# Patient Record
Sex: Female | Born: 1945 | Race: White | Hispanic: No | Smoking: Never smoker
Health system: Southern US, Community
[De-identification: ages and names within clinical notes are randomized; demographics above are authoritative.]

## PROBLEM LIST (undated history)

## (undated) DIAGNOSIS — K589 Irritable bowel syndrome without diarrhea: Secondary | ICD-10-CM

## (undated) DIAGNOSIS — E785 Hyperlipidemia, unspecified: Secondary | ICD-10-CM

## (undated) DIAGNOSIS — Z972 Presence of dental prosthetic device (complete) (partial): Secondary | ICD-10-CM

## (undated) DIAGNOSIS — Z973 Presence of spectacles and contact lenses: Secondary | ICD-10-CM

## (undated) DIAGNOSIS — Z8719 Personal history of other diseases of the digestive system: Secondary | ICD-10-CM

## (undated) DIAGNOSIS — Z9889 Other specified postprocedural states: Secondary | ICD-10-CM

## (undated) DIAGNOSIS — R112 Nausea with vomiting, unspecified: Secondary | ICD-10-CM

## (undated) DIAGNOSIS — M1990 Unspecified osteoarthritis, unspecified site: Secondary | ICD-10-CM

## (undated) DIAGNOSIS — K08109 Complete loss of teeth, unspecified cause, unspecified class: Secondary | ICD-10-CM

## (undated) DIAGNOSIS — T7840XA Allergy, unspecified, initial encounter: Secondary | ICD-10-CM

## (undated) DIAGNOSIS — I10 Essential (primary) hypertension: Secondary | ICD-10-CM

## (undated) DIAGNOSIS — R519 Headache, unspecified: Secondary | ICD-10-CM

## (undated) DIAGNOSIS — K900 Celiac disease: Secondary | ICD-10-CM

## (undated) DIAGNOSIS — J189 Pneumonia, unspecified organism: Secondary | ICD-10-CM

## (undated) DIAGNOSIS — F419 Anxiety disorder, unspecified: Secondary | ICD-10-CM

## (undated) DIAGNOSIS — J45909 Unspecified asthma, uncomplicated: Secondary | ICD-10-CM

## (undated) DIAGNOSIS — R51 Headache: Secondary | ICD-10-CM

## (undated) HISTORY — DX: Essential (primary) hypertension: I10

## (undated) HISTORY — PX: ELBOW SURGERY: SHX618

## (undated) HISTORY — DX: Hyperlipidemia, unspecified: E78.5

## (undated) HISTORY — DX: Allergy, unspecified, initial encounter: T78.40XA

## (undated) HISTORY — PX: CARPAL TUNNEL RELEASE: SHX101

## (undated) HISTORY — DX: Irritable bowel syndrome, unspecified: K58.9

## (undated) HISTORY — PX: TONSILLECTOMY AND ADENOIDECTOMY: SUR1326

## (undated) HISTORY — PX: ABDOMINAL HYSTERECTOMY: SHX81

## (undated) HISTORY — PX: CHOLECYSTECTOMY: SHX55

## (undated) HISTORY — PX: APPENDECTOMY: SHX54

## (undated) HISTORY — PX: NASAL SEPTUM SURGERY: SHX37

## (undated) HISTORY — PX: VARICOSE VEIN SURGERY: SHX832

## (undated) HISTORY — DX: Unspecified asthma, uncomplicated: J45.909

## (undated) HISTORY — PX: BREAST BIOPSY: SHX20

## (undated) HISTORY — PX: SHOULDER SURGERY: SHX246

## (undated) HISTORY — PX: KNEE SURGERY: SHX244

## (undated) HISTORY — PX: EYE SURGERY: SHX253

---

## 1983-04-13 HISTORY — PX: BREAST EXCISIONAL BIOPSY: SUR124

## 1983-04-13 HISTORY — PX: BREAST LUMPECTOMY: SHX2

## 2009-08-31 ENCOUNTER — Encounter: Admission: RE | Admit: 2009-08-31 | Discharge: 2009-08-31 | Payer: Self-pay | Admitting: Orthopedic Surgery

## 2010-01-04 ENCOUNTER — Encounter: Admission: RE | Admit: 2010-01-04 | Discharge: 2010-01-04 | Payer: Self-pay | Admitting: Orthopedic Surgery

## 2011-12-24 ENCOUNTER — Encounter: Payer: Self-pay | Admitting: Family Medicine

## 2011-12-24 ENCOUNTER — Ambulatory Visit: Payer: Self-pay | Admitting: Family Medicine

## 2011-12-24 ENCOUNTER — Ambulatory Visit (INDEPENDENT_AMBULATORY_CARE_PROVIDER_SITE_OTHER): Payer: Medicare Other | Admitting: Family Medicine

## 2011-12-24 VITALS — BP 174/79 | HR 91 | Ht 66.0 in | Wt 153.0 lb

## 2011-12-24 DIAGNOSIS — M542 Cervicalgia: Secondary | ICD-10-CM

## 2011-12-24 NOTE — Patient Instructions (Addendum)
You most likely have a cervical strain, less likely radiculopathy (irritated nerve in your neck). Both are treated similarly which is why we don't do imaging in the first visit unless there's concern for a fracture. Ibuprofen 3 tabs three times a day with food for pain and inflammation (try half of one tablet to see if it makes your heart race first). Consider Flexeril three times a day as needed for muscle spasms (can make you sleepy - if so do not drive while taking this). Consider Vicodin or percocet for severe pain (no driving on this medicine). Consider cervical collar if severely painful. Simple range of motion exercises within limits of pain to prevent further stiffness. Start physical therapy for stretching, exercises, traction, and modalities. Heat 15 minutes at a time 3-4 times a day to help with spasms. Watch head position when on computers, texting, when sleeping in bed - should in line with back to prevent spasms. Consider home traction unit if you get benefit with this in physical therapy. If not improving we will consider x-rays and MRI. Follow up with me in 1 month for reevaluation.

## 2011-12-26 ENCOUNTER — Encounter: Payer: Self-pay | Admitting: Family Medicine

## 2011-12-26 DIAGNOSIS — M542 Cervicalgia: Secondary | ICD-10-CM | POA: Insufficient documentation

## 2011-12-26 NOTE — Assessment & Plan Note (Signed)
most likely due to cervical strain, less likely radiculopathy.  Start with formal PT, ibuprofen for pain and inflammation.  Heat as needed.  Discussed ergonomic issues.  F/u in 1 month for reevaluation.  If not improving consider further imaging.
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
## 2012-01-05 ENCOUNTER — Ambulatory Visit: Payer: Medicare Other | Attending: Family Medicine | Admitting: Physical Therapy

## 2012-01-05 DIAGNOSIS — M542 Cervicalgia: Secondary | ICD-10-CM | POA: Insufficient documentation

## 2012-01-05 DIAGNOSIS — IMO0001 Reserved for inherently not codable concepts without codable children: Secondary | ICD-10-CM | POA: Insufficient documentation

## 2012-01-05 DIAGNOSIS — M25519 Pain in unspecified shoulder: Secondary | ICD-10-CM | POA: Insufficient documentation

## 2012-01-07 ENCOUNTER — Ambulatory Visit: Payer: Medicare Other | Admitting: Physical Therapy

## 2012-01-10 ENCOUNTER — Ambulatory Visit: Payer: Medicare Other | Admitting: Rehabilitation

## 2012-01-12 ENCOUNTER — Ambulatory Visit: Payer: Medicare Other | Attending: Family Medicine | Admitting: Rehabilitation

## 2012-01-12 DIAGNOSIS — M542 Cervicalgia: Secondary | ICD-10-CM | POA: Insufficient documentation

## 2012-01-12 DIAGNOSIS — M25519 Pain in unspecified shoulder: Secondary | ICD-10-CM | POA: Insufficient documentation

## 2012-01-12 DIAGNOSIS — IMO0001 Reserved for inherently not codable concepts without codable children: Secondary | ICD-10-CM | POA: Insufficient documentation

## 2012-01-13 ENCOUNTER — Encounter: Payer: Self-pay | Admitting: Family Medicine

## 2012-01-13 ENCOUNTER — Encounter: Payer: Medicare Other | Admitting: Rehabilitation

## 2012-01-13 ENCOUNTER — Ambulatory Visit (INDEPENDENT_AMBULATORY_CARE_PROVIDER_SITE_OTHER): Payer: Medicare Other | Admitting: Family Medicine

## 2012-01-13 VITALS — BP 163/92 | HR 79 | Ht 66.0 in | Wt 150.0 lb

## 2012-01-13 DIAGNOSIS — M25532 Pain in left wrist: Secondary | ICD-10-CM

## 2012-01-13 DIAGNOSIS — M25539 Pain in unspecified wrist: Secondary | ICD-10-CM

## 2012-01-13 NOTE — Patient Instructions (Addendum)
You have compressed the ulnar nerve at the wrist in your sleep (compression at Guyon's canal). This usually resolves with time but can take several weeks. We consider prednisone, anti-inflammatories but these are likely to increase your blood pressure. Start iontophoresis in physical therapy. Avoid icing or heat to the area. Wrist brace for immobilization - absolutely wear at bedtime and as much as possible during the day. We will talk about this when you come back for your neck.

## 2012-01-14 ENCOUNTER — Encounter: Payer: Medicare Other | Admitting: Physical Therapy

## 2012-01-16 ENCOUNTER — Encounter: Payer: Self-pay | Admitting: Family Medicine

## 2012-01-16 DIAGNOSIS — M25532 Pain in left wrist: Secondary | ICD-10-CM | POA: Insufficient documentation

## 2012-01-16 NOTE — Assessment & Plan Note (Signed)
Compression at guyon's canal of the ulnar nerve.  This should heal well with time and rest.  Wrist brace to help immobilize and prevent flexion as much as possible.  Can consider prednisone, nsaids but she would like to avoid as they do increase her blood pressure.  Reevaluate at f/u in 4 weeks for her neck.
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
## 2012-01-17 ENCOUNTER — Ambulatory Visit: Payer: Medicare Other | Admitting: Physical Therapy

## 2012-01-19 ENCOUNTER — Ambulatory Visit: Payer: Medicare Other | Admitting: Rehabilitation

## 2012-01-24 ENCOUNTER — Ambulatory Visit (INDEPENDENT_AMBULATORY_CARE_PROVIDER_SITE_OTHER): Payer: Medicare Other | Admitting: Family Medicine

## 2012-01-24 ENCOUNTER — Encounter: Payer: Self-pay | Admitting: Family Medicine

## 2012-01-24 ENCOUNTER — Ambulatory Visit: Payer: Medicare Other | Admitting: Physical Therapy

## 2012-01-24 VITALS — BP 153/96 | HR 82 | Ht 66.0 in | Wt 150.0 lb

## 2012-01-24 DIAGNOSIS — M25519 Pain in unspecified shoulder: Secondary | ICD-10-CM

## 2012-01-24 DIAGNOSIS — M25511 Pain in right shoulder: Secondary | ICD-10-CM

## 2012-01-25 ENCOUNTER — Encounter: Payer: Self-pay | Admitting: Family Medicine

## 2012-01-25 DIAGNOSIS — M25511 Pain in right shoulder: Secondary | ICD-10-CM | POA: Insufficient documentation
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
## 2012-01-25 NOTE — Assessment & Plan Note (Signed)
now does have signs of rotator cuff impingement.  Declined nitro and cortisone shot (has not tolerated steroids in past).  Would like to go ahead with formal PT for this so have added this.  F/u in 1 month.

## 2012-01-26 ENCOUNTER — Ambulatory Visit: Payer: Medicare Other | Admitting: Rehabilitation

## 2012-01-28 ENCOUNTER — Ambulatory Visit: Payer: Medicare Other | Admitting: Cardiology

## 2012-01-31 ENCOUNTER — Ambulatory Visit: Payer: Medicare Other | Admitting: Physical Therapy

## 2012-02-02 ENCOUNTER — Ambulatory Visit: Payer: Medicare Other | Admitting: Physical Therapy

## 2012-02-07 ENCOUNTER — Encounter: Payer: Medicare Other | Admitting: Rehabilitation

## 2012-02-07 ENCOUNTER — Ambulatory Visit: Payer: Medicare Other | Admitting: Rehabilitation

## 2012-02-09 ENCOUNTER — Ambulatory Visit: Payer: Medicare Other | Admitting: Physical Therapy

## 2012-02-11 ENCOUNTER — Ambulatory Visit: Payer: Medicare Other | Admitting: Cardiology

## 2012-02-14 ENCOUNTER — Ambulatory Visit: Payer: Medicare Other | Attending: Family Medicine | Admitting: Rehabilitation

## 2012-02-14 DIAGNOSIS — M25519 Pain in unspecified shoulder: Secondary | ICD-10-CM | POA: Insufficient documentation

## 2012-02-14 DIAGNOSIS — M542 Cervicalgia: Secondary | ICD-10-CM | POA: Insufficient documentation

## 2012-02-14 DIAGNOSIS — IMO0001 Reserved for inherently not codable concepts without codable children: Secondary | ICD-10-CM | POA: Insufficient documentation

## 2012-02-16 ENCOUNTER — Ambulatory Visit: Payer: Medicare Other | Admitting: Physical Therapy

## 2012-02-21 ENCOUNTER — Encounter: Payer: Self-pay | Admitting: Cardiology

## 2012-02-21 ENCOUNTER — Ambulatory Visit: Payer: Medicare Other | Admitting: Rehabilitation

## 2012-02-21 ENCOUNTER — Encounter: Payer: Self-pay | Admitting: *Deleted

## 2012-02-23 ENCOUNTER — Ambulatory Visit: Payer: Medicare Other | Admitting: Physical Therapy

## 2012-02-23 ENCOUNTER — Ambulatory Visit (INDEPENDENT_AMBULATORY_CARE_PROVIDER_SITE_OTHER): Payer: Medicare Other | Admitting: Cardiology

## 2012-02-23 ENCOUNTER — Encounter: Payer: Self-pay | Admitting: Cardiology

## 2012-02-23 VITALS — BP 164/98 | HR 78 | Ht 65.0 in | Wt 158.0 lb

## 2012-02-23 DIAGNOSIS — E785 Hyperlipidemia, unspecified: Secondary | ICD-10-CM

## 2012-02-23 DIAGNOSIS — I10 Essential (primary) hypertension: Secondary | ICD-10-CM

## 2012-02-23 NOTE — Patient Instructions (Addendum)
Your physician wants you to follow-up in: ONE YEAR WITH DR CRENSHAW IN [place] You will receive a reminder letter in the mail two months in advance. If you don't receive a letter, please call our office to schedule the follow-up appointment.  

## 2012-02-23 NOTE — Assessment & Plan Note (Signed)
Patient's recent LDL was greater than 100 but her HDL was greater than 100 as well. Continue management per primary care. I do not think a statin is indicated.

## 2012-02-23 NOTE — Progress Notes (Signed)
HPI: 66 year old female for evaluation of hypertension. Patient has had previous echocardiogram and stress test in [facility] in the past 5 years but I do not have those records available. By her report these studies were normal. She typically does not have dyspnea on exertion, orthopnea, PND, pedal edema, palpitations, syncope or chest pain. No claudication. Her blood pressure is typically controlled by her report.  Current Outpatient Prescriptions  Medication Sig Dispense Refill  . Biotin 5 MG CAPS Take 1 capsule by mouth daily.      . Cholecalciferol (VITAMIN D3) 2000 UNITS capsule Take 2,000 Units by mouth daily.      . NON FORMULARY Raseberry Keton and green tea 1 tab oral daily      . OLIVE LEAF EXTRACT PO Take 1 tablet by mouth daily.      Marland Kitchen olmesartan (BENICAR) 20 MG tablet Take 20 mg by mouth daily.      Marland Kitchen PREMARIN 1.25 MG tablet Take 1.25 mg by mouth daily.       Marland Kitchen Resveratrol 250 MG CAPS Take 1 capsule by mouth daily.      . Vitamin Mixture (ESTER-C PO) Take 1 tablet by mouth daily.        Allergies  Allergen Reactions  . Aspirin   . Codeine   . Penicillins   . Shellfish Allergy   . Sulfa Antibiotics     Past Medical History  Diagnosis Date  . Allergy   . Asthma   . Hypertension   . Hyperlipidemia   . IBS (irritable bowel syndrome)     Past Surgical History  Procedure Date  . Shoulder surgery   . Elbow surgery   . Knee surgery   . Carpal tunnel release   . Varicose vein surgery   . Nasal septum surgery   . Abdominal hysterectomy   . Cholecystectomy   . Appendectomy   . Tonsillectomy and adenoidectomy   . Breast lumpectomy     History   Social History  . Marital Status: Married    Spouse Name: N/A    Number of Children: 3  . Years of Education: N/A   Occupational History  .      Safety consultant   Social History Main Topics  . Smoking status: Never Smoker   . Smokeless tobacco: Not on file  . Alcohol Use: Yes     Comment: Occasional glass  of wine  . Drug Use: Not on file  . Sexually Active: Not on file   Other Topics Concern  . Not on file   Social History Narrative  . No narrative on file    Family History  Problem Relation Age of Onset  . Hypertension Mother   . Heart attack Father     MI at age 58  . Diabetes Neg Hx   . Sudden death Neg Hx   . Hyperlipidemia Other   . Hypertension Other     ROS: no fevers or chills, productive cough, hemoptysis, dysphasia, odynophagia, melena, hematochezia, dysuria, hematuria, rash, seizure activity, orthopnea, PND, pedal edema, claudication. Remaining systems are negative.  Physical Exam:   Blood pressure 164/98, pulse 78, height 5\' 5"  (1.651 m), weight 158 lb (71.668 kg).  General:  Well developed/well nourished in NAD Skin warm/dry Patient not depressed No peripheral clubbing Back-normal HEENT-normal/normal eyelids Neck supple/normal carotid upstroke bilaterally; no bruits; no JVD; no thyromegaly chest - CTA/ normal expansion CV - RRR/normal S1 and S2; no murmurs, rubs or gallops;  PMI nondisplaced  Abdomen -NT/ND, no HSM, no mass, + bowel sounds, no bruit 2+ femoral pulses, no bruits Ext-no edema, chords, 2+ DP Neuro-grossly nonfocal  ECG sinus rhythm, left anterior fascicular block, RV conduction delay, cannot rule out prior septal infarct.

## 2012-02-23 NOTE — Assessment & Plan Note (Signed)
Patient's blood pressure is mildly elevated today but she follows this at home and it is typically controlled. She is also under significant amounts of stress as her husband has been diagnosed with prostate cancer and her daughter with breast cancer. She will continue her present medications. If her blood pressure remains high at home on followup readings I will increase her Benicar. We discussed lifestyle modification including weight loss, exercise and low-sodium diet. She does not smoke.

## 2012-02-28 ENCOUNTER — Ambulatory Visit: Payer: Medicare Other | Admitting: Rehabilitation

## 2012-03-01 ENCOUNTER — Ambulatory Visit: Payer: Medicare Other | Admitting: Physical Therapy

## 2012-03-03 ENCOUNTER — Ambulatory Visit: Payer: Medicare Other | Admitting: Family Medicine

## 2012-03-06 ENCOUNTER — Ambulatory Visit: Payer: Medicare Other | Admitting: Rehabilitation

## 2012-03-06 ENCOUNTER — Ambulatory Visit: Payer: Medicare Other | Admitting: Family Medicine

## 2012-03-08 ENCOUNTER — Ambulatory Visit: Payer: Medicare Other | Admitting: Rehabilitation

## 2012-03-13 ENCOUNTER — Ambulatory Visit: Payer: Medicare Other | Admitting: Family Medicine

## 2012-09-21 ENCOUNTER — Encounter: Payer: Self-pay | Admitting: Specialist

## 2012-09-21 NOTE — Progress Notes (Signed)
Individual counseling session with pt at 9am on 6/12  [facility] Counseling Intern Haroldine Laws

## 2012-10-17 ENCOUNTER — Encounter: Payer: Self-pay | Admitting: Specialist
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
## 2013-05-06 ENCOUNTER — Other Ambulatory Visit: Payer: Self-pay | Admitting: Family Medicine

## 2013-05-07 NOTE — Telephone Encounter (Signed)
Patient needs an appt.  She has not been seen since 02/2012. PG

## 2013-05-09 ENCOUNTER — Ambulatory Visit (INDEPENDENT_AMBULATORY_CARE_PROVIDER_SITE_OTHER): Payer: Medicare Other | Admitting: Cardiology

## 2013-05-09 ENCOUNTER — Encounter: Payer: Self-pay | Admitting: Cardiology

## 2013-05-09 VITALS — BP 164/80 | HR 75 | Wt 149.0 lb

## 2013-05-09 DIAGNOSIS — I10 Essential (primary) hypertension: Secondary | ICD-10-CM

## 2013-05-09 DIAGNOSIS — R079 Chest pain, unspecified: Secondary | ICD-10-CM

## 2013-05-09 MED ORDER — OLMESARTAN MEDOXOMIL 40 MG PO TABS: 40.0000 mg | ORAL_TABLET | Freq: Every day | ORAL | Status: DC

## 2013-05-09 NOTE — Assessment & Plan Note (Signed)
Blood pressure mildly elevated. Increase Benicar to 40 mg daily. Check potassium and renal function in one week.

## 2013-05-09 NOTE — Patient Instructions (Signed)
Your physician wants you to follow-up in: ONE YEAR WITH DR Shelda PalRENSHAW You will receive a reminder letter in the mail two months in advance. If you don't receive a letter, please call our office to schedule the follow-up appointment.   INCREASE BENICAR TO 40 MG ONCE DAILY  Your physician recommends that you return for lab work in: ONE WEEK- DO NOT EAT PRIOR TO LAB WORK  Your physician has requested that you have an echocardiogram. Echocardiography is a painless test that uses sound waves to create images of your heart. It provides your doctor with information about the size and shape of your heart and how well your heart's chambers and valves are working. This procedure takes approximately one hour. There are no restrictions for this procedure.

## 2013-05-09 NOTE — Progress Notes (Signed)
      HPI: FU hypertension. Myoview in 2009 showed an ejection fraction of 64% and normal perfusion. Echocardiogram in February of 2011 showed normal LV function and mild mitral regurgitation. She was last seen in November of 2013. Since then, she occasionally has a pinching chest pain that lasts for approximately 1 minute. No dyspnea on exertion, exertional chest pain or syncope. Significant stress due to recent loss of her husband.    Current Outpatient Prescriptions  Medication Sig Dispense Refill  . aspirin 81 MG tablet Take 81 mg by mouth daily.      . Biotin 5 MG CAPS Take 1 capsule by mouth daily.      . Cholecalciferol (VITAMIN D3) 2000 UNITS capsule Take 2,000 Units by mouth daily.      . NON FORMULARY Raseberry Keton and green tea 1 tab oral daily      . OLIVE LEAF EXTRACT PO Take 1 tablet by mouth daily.      Marland Kitchen. olmesartan (BENICAR) 20 MG tablet Take 20 mg by mouth daily.      Marland Kitchen. PREMARIN 1.25 MG tablet Take 1.25 mg by mouth daily.       Marland Kitchen. Resveratrol 250 MG CAPS Take 1 capsule by mouth daily.      . Vitamin Mixture (ESTER-C PO) Take 1 tablet by mouth daily.      . Vitamin Mixture (VITAMIN E COMPLETE) CAPS Take 1 capsule by mouth daily.       No current facility-administered medications for this visit.     Past Medical History  Diagnosis Date  . Allergy   . Asthma   . Hypertension   . Hyperlipidemia   . IBS (irritable bowel syndrome)     Past Surgical History  Procedure Laterality Date  . Shoulder surgery    . Elbow surgery    . Knee surgery    . Carpal tunnel release    . Varicose vein surgery    . Nasal septum surgery    . Abdominal hysterectomy    . Cholecystectomy    . Appendectomy    . Tonsillectomy and adenoidectomy    . Breast lumpectomy      History   Social History  . Marital Status: Married    Spouse Name: N/A    Number of Children: 3  . Years of Education: N/A   Occupational History  .      Safety consultant   Social History Main Topics   . Smoking status: Never Smoker   . Smokeless tobacco: Not on file  . Alcohol Use: Yes     Comment: Occasional glass of wine  . Drug Use: Not on file  . Sexual Activity: Not on file   Other Topics Concern  . Not on file   Social History Narrative  . No narrative on file    ROS: no fevers or chills, productive cough, hemoptysis, dysphasia, odynophagia, melena, hematochezia, dysuria, hematuria, rash, seizure activity, orthopnea, PND, pedal edema, claudication. Remaining systems are negative.  Physical Exam: Well-developed well-nourished in no acute distress.  Skin is warm and dry.  HEENT is normal.  Neck is supple.  Chest is clear to auscultation with normal expansion.  Cardiovascular exam is regular rate and rhythm.  Abdominal exam nontender or distended. No masses palpated. Extremities show no edema. neuro grossly intact  ECG sinus rhythm at a rate of 75. Left anterior fascicular block. Poor R wave progression cannot rule out prior anterior infarct.

## 2013-05-09 NOTE — Assessment & Plan Note (Signed)
Symptoms atypical; previous functional study negative. We'll repeat echocardiogram. If LV function normal no further ischemia evaluation.

## 2013-05-09 NOTE — Assessment & Plan Note (Signed)
Check lipids 

## 2013-05-30 ENCOUNTER — Other Ambulatory Visit (HOSPITAL_COMMUNITY): Payer: Medicare Other

## 2013-06-06 ENCOUNTER — Encounter: Payer: Self-pay | Admitting: *Deleted

## 2013-06-07 ENCOUNTER — Other Ambulatory Visit (HOSPITAL_COMMUNITY): Payer: Medicare Other

## 2013-06-19 ENCOUNTER — Ambulatory Visit (HOSPITAL_COMMUNITY): Payer: Medicare Other | Attending: Cardiology | Admitting: Cardiology

## 2013-06-19 ENCOUNTER — Other Ambulatory Visit: Payer: Self-pay | Admitting: Orthopedic Surgery

## 2013-06-19 DIAGNOSIS — M25531 Pain in right wrist: Secondary | ICD-10-CM

## 2013-06-19 DIAGNOSIS — M25512 Pain in left shoulder: Secondary | ICD-10-CM

## 2013-06-19 DIAGNOSIS — R079 Chest pain, unspecified: Secondary | ICD-10-CM | POA: Insufficient documentation

## 2013-06-19 NOTE — Progress Notes (Signed)
Echo performed. 

## 2013-06-20 ENCOUNTER — Other Ambulatory Visit: Payer: Self-pay | Admitting: Cardiology

## 2013-06-20 LAB — BASIC METABOLIC PANEL WITH GFR
BUN: 13 mg/dL (ref 6–23)
CO2: 28 meq/L (ref 19–32)
Calcium: 9.1 mg/dL (ref 8.4–10.5)
Chloride: 102 mEq/L (ref 96–112)
Creat: 0.83 mg/dL (ref 0.50–1.10)
GFR, EST AFRICAN AMERICAN: 83 mL/min
GFR, Est Non African American: 72 mL/min
GLUCOSE: 84 mg/dL (ref 70–99)
Potassium: 4.5 mEq/L (ref 3.5–5.3)
Sodium: 137 mEq/L (ref 135–145)

## 2013-06-20 LAB — HEPATIC FUNCTION PANEL
ALK PHOS: 52 U/L (ref 39–117)
ALT: 13 U/L (ref 0–35)
AST: 15 U/L (ref 0–37)
Albumin: 3.8 g/dL (ref 3.5–5.2)
BILIRUBIN TOTAL: 0.4 mg/dL (ref 0.2–1.2)
Bilirubin, Direct: 0.1 mg/dL (ref 0.0–0.3)
Indirect Bilirubin: 0.3 mg/dL (ref 0.2–1.2)
TOTAL PROTEIN: 6.6 g/dL (ref 6.0–8.3)

## 2013-06-20 LAB — LIPID PANEL
CHOLESTEROL: 225 mg/dL — AB (ref 0–200)
HDL: 74 mg/dL (ref >39)
LDL CALC: 120 mg/dL — AB (ref 0–99)
TRIGLYCERIDES: 154 mg/dL — AB (ref <150)
Total CHOL/HDL Ratio: 3 Ratio
VLDL: 31 mg/dL (ref 0–40)

## 2013-06-21 ENCOUNTER — Telehealth: Payer: Self-pay | Admitting: *Deleted

## 2013-06-21 NOTE — Telephone Encounter (Signed)
Message copied by Barrie FolkGRAY, Martrell Eguia F on Thu Jun 21, 2013  3:41 PM ------      Message from: Lewayne BuntingRENSHAW, BRIAN S      Created: Thu Jun 21, 2013  8:48 AM       Continue diet      Olga MillersBrian Crenshaw       ------

## 2013-06-21 NOTE — Telephone Encounter (Signed)
Pt is aware of lab results  She states she did not tolerate the increased dose of Benicar that was started at last ov in January 2015. She went back to the 20mg  dose. States she is working on "letting things go & reducing my stress"   Mylo Redebbie Margarete Horace RN

## 2013-06-24 ENCOUNTER — Ambulatory Visit
Admission: RE | Admit: 2013-06-24 | Discharge: 2013-06-24 | Disposition: A | Discharge status: Home / Self Care | Payer: Medicare Other | Source: Ambulatory Visit | Attending: Orthopedic Surgery | Admitting: Orthopedic Surgery

## 2013-06-24 DIAGNOSIS — M25531 Pain in right wrist: Secondary | ICD-10-CM

## 2013-06-24 DIAGNOSIS — M25512 Pain in left shoulder: Secondary | ICD-10-CM

## 2013-09-11 ENCOUNTER — Other Ambulatory Visit: Payer: Self-pay | Admitting: Orthopedic Surgery

## 2013-09-14 ENCOUNTER — Encounter (HOSPITAL_BASED_OUTPATIENT_CLINIC_OR_DEPARTMENT_OTHER): Payer: Self-pay | Admitting: *Deleted

## 2013-09-14 NOTE — Progress Notes (Signed)
Pt will come in for bmet-sees dr Jens Som for htn-m-echo done 3/15 good

## 2013-09-17 ENCOUNTER — Encounter (HOSPITAL_BASED_OUTPATIENT_CLINIC_OR_DEPARTMENT_OTHER)
Admission: RE | Admit: 2013-09-17 | Discharge: 2013-09-17 | Disposition: A | Discharge status: Home / Self Care | Payer: Medicare Other | Source: Ambulatory Visit | Attending: Orthopedic Surgery | Admitting: Orthopedic Surgery

## 2013-09-17 LAB — BASIC METABOLIC PANEL
BUN: 13 mg/dL (ref 6–23)
CO2: 25 mEq/L (ref 19–32)
CREATININE: 0.83 mg/dL (ref 0.50–1.10)
Calcium: 9.2 mg/dL (ref 8.4–10.5)
Chloride: 102 mEq/L (ref 96–112)
GFR calc Af Amer: 81 mL/min — ABNORMAL LOW (ref >90)
GFR, EST NON AFRICAN AMERICAN: 70 mL/min — AB (ref >90)
Glucose, Bld: 81 mg/dL (ref 70–99)
POTASSIUM: 5.3 meq/L (ref 3.7–5.3)
Sodium: 138 mEq/L (ref 137–147)
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
## 2013-09-20 ENCOUNTER — Encounter (HOSPITAL_BASED_OUTPATIENT_CLINIC_OR_DEPARTMENT_OTHER): Payer: Medicare Other | Admitting: Anesthesiology

## 2013-09-20 ENCOUNTER — Encounter (HOSPITAL_BASED_OUTPATIENT_CLINIC_OR_DEPARTMENT_OTHER): Payer: Self-pay | Admitting: Anesthesiology

## 2013-09-20 ENCOUNTER — Ambulatory Visit (HOSPITAL_BASED_OUTPATIENT_CLINIC_OR_DEPARTMENT_OTHER): Payer: Medicare Other | Admitting: Anesthesiology

## 2013-09-20 ENCOUNTER — Ambulatory Visit (HOSPITAL_BASED_OUTPATIENT_CLINIC_OR_DEPARTMENT_OTHER)
Admission: RE | Admit: 2013-09-20 | Discharge: 2013-09-20 | Disposition: A | Discharge status: Home / Self Care | Payer: Medicare Other | Source: Ambulatory Visit | Attending: Orthopedic Surgery | Admitting: Orthopedic Surgery

## 2013-09-20 ENCOUNTER — Encounter (HOSPITAL_BASED_OUTPATIENT_CLINIC_OR_DEPARTMENT_OTHER)
Admission: RE | Disposition: A | Discharge status: Home / Self Care | Payer: Self-pay | Source: Ambulatory Visit | Attending: Orthopedic Surgery

## 2013-09-20 DIAGNOSIS — M25539 Pain in unspecified wrist: Secondary | ICD-10-CM | POA: Insufficient documentation

## 2013-09-20 DIAGNOSIS — I10 Essential (primary) hypertension: Secondary | ICD-10-CM | POA: Insufficient documentation

## 2013-09-20 DIAGNOSIS — J45909 Unspecified asthma, uncomplicated: Secondary | ICD-10-CM | POA: Insufficient documentation

## 2013-09-20 HISTORY — DX: Presence of dental prosthetic device (complete) (partial): Z97.2

## 2013-09-20 HISTORY — DX: Unspecified osteoarthritis, unspecified site: M19.90

## 2013-09-20 HISTORY — PX: WRIST ARTHROSCOPY: SHX838

## 2013-09-20 HISTORY — DX: Presence of spectacles and contact lenses: Z97.3

## 2013-09-20 HISTORY — DX: Complete loss of teeth, unspecified cause, unspecified class: K08.109

## 2013-09-20 LAB — POCT HEMOGLOBIN-HEMACUE: Hemoglobin: 14.8 g/dL (ref 12.0–15.0)

## 2013-09-20 SURGERY — ARTHROSCOPY, WRIST
Anesthesia: General | Site: Wrist | Laterality: Right

## 2013-09-20 MED ORDER — OXYCODONE HCL 5 MG PO TABS: 5.0000 mg | ORAL_TABLET | Freq: Once | ORAL | Status: DC | PRN

## 2013-09-20 MED ORDER — ONDANSETRON HCL 4 MG/2ML IJ SOLN
INTRAMUSCULAR | Status: DC | PRN
  Administered 2013-09-20: 4 mg via INTRAVENOUS

## 2013-09-20 MED ORDER — FENTANYL CITRATE 0.05 MG/ML IJ SOLN
INTRAMUSCULAR | Status: DC | PRN
  Administered 2013-09-20 (×4): 50 ug via INTRAVENOUS

## 2013-09-20 MED ORDER — LIDOCAINE HCL 2 % IJ SOLN
INTRAMUSCULAR | Status: DC | PRN
Start: 2013-09-20 — End: 2013-09-20
  Administered 2013-09-20: 4 mL

## 2013-09-20 MED ORDER — MIDAZOLAM HCL 5 MG/5ML IJ SOLN
INTRAMUSCULAR | Status: DC | PRN
  Administered 2013-09-20: 2 mg via INTRAVENOUS

## 2013-09-20 MED ORDER — OXYCODONE HCL 5 MG/5ML PO SOLN: 5.0000 mg | Freq: Once | ORAL | Status: DC | PRN

## 2013-09-20 MED ORDER — VANCOMYCIN HCL IN DEXTROSE 1-5 GM/200ML-% IV SOLN
INTRAVENOUS | Status: AC
  Filled 2013-09-20: qty 200

## 2013-09-20 MED ORDER — LACTATED RINGERS IV SOLN
INTRAVENOUS | Status: DC
  Administered 2013-09-20 (×3): via INTRAVENOUS

## 2013-09-20 MED ORDER — MIDAZOLAM HCL 2 MG/2ML IJ SOLN: 1.0000 mg | INTRAMUSCULAR | Status: DC | PRN

## 2013-09-20 MED ORDER — VANCOMYCIN HCL 1000 MG IV SOLR
1000.0000 mg | INTRAVENOUS | Status: DC | PRN
  Administered 2013-09-20: 1000 mg via INTRAVENOUS

## 2013-09-20 MED ORDER — METOCLOPRAMIDE HCL 5 MG/ML IJ SOLN
10.0000 mg | Freq: Once | INTRAMUSCULAR | Status: AC | PRN
  Administered 2013-09-20: 10 mg via INTRAVENOUS

## 2013-09-20 MED ORDER — HYDROMORPHONE HCL PF 1 MG/ML IJ SOLN
INTRAMUSCULAR | Status: AC
  Filled 2013-09-20: qty 1

## 2013-09-20 MED ORDER — SODIUM CHLORIDE 0.9 % IJ SOLN
INTRAMUSCULAR | Status: AC
  Filled 2013-09-20: qty 10

## 2013-09-20 MED ORDER — FENTANYL CITRATE 0.05 MG/ML IJ SOLN
INTRAMUSCULAR | Status: AC
  Filled 2013-09-20: qty 4

## 2013-09-20 MED ORDER — PROPOFOL 10 MG/ML IV BOLUS
INTRAVENOUS | Status: DC | PRN
  Administered 2013-09-20: 100 mg via INTRAVENOUS
  Administered 2013-09-20: 150 mg via INTRAVENOUS

## 2013-09-20 MED ORDER — PROMETHAZINE HCL 25 MG/ML IJ SOLN
INTRAMUSCULAR | Status: AC
  Filled 2013-09-20: qty 1

## 2013-09-20 MED ORDER — MIDAZOLAM HCL 2 MG/2ML IJ SOLN
INTRAMUSCULAR | Status: AC
  Filled 2013-09-20: qty 2

## 2013-09-20 MED ORDER — FENTANYL CITRATE 0.05 MG/ML IJ SOLN: 50.0000 ug | INTRAMUSCULAR | Status: DC | PRN

## 2013-09-20 MED ORDER — PROMETHAZINE HCL 25 MG/ML IJ SOLN
6.2500 mg | INTRAMUSCULAR | Status: DC | PRN
  Administered 2013-09-20: 6.25 mg via INTRAVENOUS

## 2013-09-20 MED ORDER — HYDROMORPHONE HCL PF 1 MG/ML IJ SOLN
0.2500 mg | INTRAMUSCULAR | Status: DC | PRN
  Administered 2013-09-20 (×4): 0.5 mg via INTRAVENOUS

## 2013-09-20 MED ORDER — LIDOCAINE HCL (CARDIAC) 20 MG/ML IV SOLN
INTRAVENOUS | Status: DC | PRN
  Administered 2013-09-20: 50 mg via INTRAVENOUS

## 2013-09-20 MED ORDER — CHLORHEXIDINE GLUCONATE 4 % EX LIQD: 60.0000 mL | Freq: Once | CUTANEOUS | Status: DC

## 2013-09-20 MED ORDER — DOXYCYCLINE HYCLATE 100 MG PO TABS: 100.0000 mg | ORAL_TABLET | Freq: Two times a day (BID) | ORAL | Status: DC

## 2013-09-20 MED ORDER — HYDROMORPHONE HCL 2 MG PO TABS: 2.0000 mg | ORAL_TABLET | ORAL | Status: DC | PRN

## 2013-09-20 MED ORDER — LIDOCAINE HCL 2 % IJ SOLN
INTRAMUSCULAR | Status: AC
  Filled 2013-09-20: qty 20

## 2013-09-20 MED ORDER — METOCLOPRAMIDE HCL 5 MG/ML IJ SOLN
INTRAMUSCULAR | Status: AC
  Filled 2013-09-20: qty 2

## 2013-09-20 MED ORDER — DEXAMETHASONE SODIUM PHOSPHATE 4 MG/ML IJ SOLN
INTRAMUSCULAR | Status: DC | PRN
  Administered 2013-09-20: 10 mg via INTRAVENOUS

## 2013-09-20 MED ORDER — VANCOMYCIN HCL IN DEXTROSE 1-5 GM/200ML-% IV SOLN: 1000.0000 mg | INTRAVENOUS | Status: DC

## 2013-09-20 SURGICAL SUPPLY — 93 items
BANDAGE ADH SHEER 1  50/CT (GAUZE/BANDAGES/DRESSINGS) IMPLANT
BANDAGE ELASTIC 3 VELCRO ST LF (GAUZE/BANDAGES/DRESSINGS) ×6 IMPLANT
BANDAGE ELASTIC 4 VELCRO ST LF (GAUZE/BANDAGES/DRESSINGS) ×3 IMPLANT
BLADE AVERAGE 25MMX9MM (BLADE)
BLADE AVERAGE 25X9 (BLADE) IMPLANT
BLADE MINI RND TIP GREEN BEAV (BLADE) ×3 IMPLANT
BLADE SURG 15 STRL LF DISP TIS (BLADE) ×1 IMPLANT
BLADE SURG 15 STRL SS (BLADE) ×3
BNDG CMPR 9X4 STRL LF SNTH (GAUZE/BANDAGES/DRESSINGS) ×1
BNDG COHESIVE 3X5 TAN STRL LF (GAUZE/BANDAGES/DRESSINGS) IMPLANT
BNDG COHESIVE 4X5 TAN STRL (GAUZE/BANDAGES/DRESSINGS) IMPLANT
BNDG ESMARK 4X9 LF (GAUZE/BANDAGES/DRESSINGS) ×3 IMPLANT
BNDG GAUZE ELAST 4 BULKY (GAUZE/BANDAGES/DRESSINGS) IMPLANT
BRUSH SCRUB EZ PLAIN DRY (MISCELLANEOUS) ×3 IMPLANT
BUR CUDA 2.9 (BURR) IMPLANT
BUR CUDA 2.9MM (BURR)
BUR FULL RADIUS 2.9 (BURR) IMPLANT
BUR FULL RADIUS 2.9MM (BURR)
BUR GATOR 2.9 (BURR) ×2 IMPLANT
BUR GATOR 2.9MM (BURR) ×1
BUR SPHERICAL 2.9 (BURR) IMPLANT
BUR SPHERICAL 2.9MM (BURR)
CANISTER SUCT 1200ML W/VALVE (MISCELLANEOUS) IMPLANT
CLOSURE WOUND 1/2 X4 (GAUZE/BANDAGES/DRESSINGS)
CORDS BIPOLAR (ELECTRODE) ×3 IMPLANT
COVER MAYO STAND STRL (DRAPES) ×3 IMPLANT
COVER TABLE BACK 60X90 (DRAPES) ×3 IMPLANT
CUFF TOURNIQUET SINGLE 18IN (TOURNIQUET CUFF) IMPLANT
DECANTER SPIKE VIAL GLASS SM (MISCELLANEOUS) IMPLANT
DRAPE EXTREMITY T 121X128X90 (DRAPE) ×3 IMPLANT
DRAPE OEC MINIVIEW 54X84 (DRAPES) IMPLANT
DRAPE SURG 17X23 STRL (DRAPES) ×3 IMPLANT
DRSG EMULSION OIL 3X3 NADH (GAUZE/BANDAGES/DRESSINGS) IMPLANT
GAUZE SPONGE 4X4 12PLY STRL (GAUZE/BANDAGES/DRESSINGS) ×3 IMPLANT
GAUZE XEROFORM 1X8 LF (GAUZE/BANDAGES/DRESSINGS) IMPLANT
GLOVE BIOGEL M STRL SZ7.5 (GLOVE) ×3 IMPLANT
GLOVE ORTHO TXT STRL SZ7.5 (GLOVE) ×3 IMPLANT
GOWN STRL REUS W/ TWL LRG LVL3 (GOWN DISPOSABLE) ×1 IMPLANT
GOWN STRL REUS W/ TWL XL LVL3 (GOWN DISPOSABLE) ×2 IMPLANT
GOWN STRL REUS W/TWL LRG LVL3 (GOWN DISPOSABLE) ×3
GOWN STRL REUS W/TWL XL LVL3 (GOWN DISPOSABLE) ×6
IV NS 500ML (IV SOLUTION) ×3
IV NS 500ML BAXH (IV SOLUTION) ×1 IMPLANT
IV NS IRRIG 3000ML ARTHROMATIC (IV SOLUTION) IMPLANT
LOOP VESSEL MAXI BLUE (MISCELLANEOUS) IMPLANT
NDL SAFETY ECLIPSE 18X1.5 (NEEDLE) IMPLANT
NEEDLE 27GAX1X1/2 (NEEDLE) IMPLANT
NEEDLE HYPO 18GX1.5 SHARP (NEEDLE)
NEEDLE HYPO 22GX1.5 SAFETY (NEEDLE) ×3 IMPLANT
NEEDLE MAYO 6 CRC TAPER PT (NEEDLE) IMPLANT
NEEDLE TUOHY 20GX3.5 (NEEDLE) IMPLANT
NS IRRIG 1000ML POUR BTL (IV SOLUTION) IMPLANT
PACK BASIN DAY SURGERY FS (CUSTOM PROCEDURE TRAY) ×3 IMPLANT
PAD CAST 3X4 CTTN HI CHSV (CAST SUPPLIES) ×1 IMPLANT
PADDING CAST ABS 4INX4YD NS (CAST SUPPLIES) ×2
PADDING CAST ABS COTTON 4X4 ST (CAST SUPPLIES) ×1 IMPLANT
PADDING CAST COTTON 3X4 STRL (CAST SUPPLIES) ×3
PASSER SUT SWANSON 36MM LOOP (INSTRUMENTS) IMPLANT
SET SM JOINT TUBING/CANN (CANNULA) ×3 IMPLANT
SLEEVE SCD COMPRESS KNEE MED (MISCELLANEOUS) ×3 IMPLANT
SPLINT PLASTER CAST XFAST 3X15 (CAST SUPPLIES) ×8 IMPLANT
SPLINT PLASTER XTRA FASTSET 3X (CAST SUPPLIES) ×16
STOCKINETTE 4X48 STRL (DRAPES) ×3 IMPLANT
STRIP CLOSURE SKIN 1/2X4 (GAUZE/BANDAGES/DRESSINGS) IMPLANT
SUCTION FRAZIER TIP 10 FR DISP (SUCTIONS) IMPLANT
SUT ETHIBOND 3-0 V-5 (SUTURE) ×3 IMPLANT
SUT ETHILON 5 0 P 3 18 (SUTURE)
SUT FIBERWIRE 2-0 18 17.9 3/8 (SUTURE)
SUT FIBERWIRE 3-0 18 TAPR NDL (SUTURE)
SUT MERSILENE 4 0 P 3 (SUTURE) IMPLANT
SUT NYLON ETHILON 5-0 P-3 1X18 (SUTURE) IMPLANT
SUT PROLENE 3 0 PS 2 (SUTURE) IMPLANT
SUT PROLENE 4 0 P 3 18 (SUTURE) ×3 IMPLANT
SUT STEEL 0 (SUTURE)
SUT STEEL 0 18XMFL TIE 17 (SUTURE) IMPLANT
SUT STEEL 3 0 (SUTURE) IMPLANT
SUT VIC AB 0 CT1 27 (SUTURE)
SUT VIC AB 0 CT1 27XBRD ANBCTR (SUTURE) IMPLANT
SUT VIC AB 2-0 SH 27 (SUTURE)
SUT VIC AB 2-0 SH 27XBRD (SUTURE) IMPLANT
SUT VIC AB 4-0 P-3 18XBRD (SUTURE) IMPLANT
SUT VIC AB 4-0 P3 18 (SUTURE)
SUT VICRYL 4-0 PS2 18IN ABS (SUTURE) IMPLANT
SUTURE FIBERWR 2-0 18 17.9 3/8 (SUTURE) IMPLANT
SUTURE FIBERWR 3-0 18 TAPR NDL (SUTURE) IMPLANT
SYR 3ML 23GX1 SAFETY (SYRINGE) IMPLANT
SYR BULB 3OZ (MISCELLANEOUS) IMPLANT
SYR CONTROL 10ML LL (SYRINGE) ×3 IMPLANT
TRAY DSU PREP LF (CUSTOM PROCEDURE TRAY) ×3 IMPLANT
TUBE CONNECTING 20'X1/4 (TUBING) ×1
TUBE CONNECTING 20X1/4 (TUBING) ×2 IMPLANT
UNDERPAD 30X30 INCONTINENT (UNDERPADS AND DIAPERS) ×3 IMPLANT
WATER STERILE IRR 1000ML POUR (IV SOLUTION) ×3 IMPLANT

## 2013-09-20 NOTE — Discharge Instructions (Addendum)

## 2013-09-20 NOTE — Anesthesia Procedure Notes (Signed)
Procedure Name: LMA Insertion Date/Time: 09/20/2013 9:56 AM Performed by: Genevieve Norlander L Pre-anesthesia Checklist: Patient identified, Emergency Drugs available, Suction available, Patient being monitored and Timeout performed Patient Re-evaluated:Patient Re-evaluated prior to inductionOxygen Delivery Method: Circle System Utilized Preoxygenation: Pre-oxygenation with 100% oxygen Intubation Type: IV induction Ventilation: Mask ventilation without difficulty LMA: LMA inserted LMA Size: 4.0 Number of attempts: 1 Airway Equipment and Method: bite block Placement Confirmation: positive ETCO2 and breath sounds checked- equal and bilateral Tube secured with: Tape Dental Injury: Teeth and Oropharynx as per pre-operative assessment

## 2013-09-20 NOTE — Anesthesia Preprocedure Evaluation (Signed)
Anesthesia Evaluation  Patient identified by MRN, date of birth, ID band Patient awake    Reviewed: Allergy & Precautions, H&P , NPO status , Patient's Chart, lab work & pertinent test results, reviewed documented beta blocker date and time   Airway Mallampati: II TM Distance: >3 FB Neck ROM: full    Dental   Pulmonary asthma ,  breath sounds clear to auscultation        Cardiovascular hypertension, negative cardio ROS  Rhythm:regular     Neuro/Psych negative neurological ROS  negative psych ROS   GI/Hepatic negative GI ROS, Neg liver ROS,   Endo/Other  negative endocrine ROS  Renal/GU negative Renal ROS  negative genitourinary   Musculoskeletal   Abdominal   Peds  Hematology negative hematology ROS (+)   Anesthesia Other Findings See surgeon's H&P   Reproductive/Obstetrics negative OB ROS                           Anesthesia Physical Anesthesia Plan  ASA: II  Anesthesia Plan: General   Post-op Pain Management:    Induction: Intravenous  Airway Management Planned: LMA  Additional Equipment:   Intra-op Plan:   Post-operative Plan:   Informed Consent: I have reviewed the patients History and Physical, chart, labs and discussed the procedure including the risks, benefits and alternatives for the proposed anesthesia with the patient or authorized representative who has indicated his/her understanding and acceptance.   Dental Advisory Given  Plan Discussed with: CRNA and Surgeon  Anesthesia Plan Comments:         Anesthesia Quick Evaluation

## 2013-09-20 NOTE — Anesthesia Postprocedure Evaluation (Signed)
  Anesthesia Post-op Note  Patient: [name] [name]  Procedure(s) Performed: Procedure(s): RIGHT ARTHROSCOPY WRIST EXCISION PISIFORM (Right)  Patient Location: PACU  Anesthesia Type:General  Level of Consciousness: awake, alert  and oriented  Airway and Oxygen Therapy: Patient Spontanous Breathing  Post-op Pain: mild  Post-op Assessment: Post-op Vital signs reviewed  Post-op Vital Signs: Reviewed  Last Vitals:  Filed Vitals:   09/20/13 1315  BP: 140/66  Pulse: 68  Temp:   Resp: 11    Complications: No apparent anesthesia complications

## 2013-09-20 NOTE — Transfer of Care (Signed)
Immediate Anesthesia Transfer of Care Note  Patient: [name] [name]  Procedure(s) Performed: Procedure(s): RIGHT ARTHROSCOPY WRIST EXCISION PISIFORM (Right)  Patient Location: PACU  Anesthesia Type:General  Level of Consciousness: awake and alert   Airway & Oxygen Therapy: Patient Spontanous Breathing and Patient connected to face mask oxygen  Post-op Assessment: Report given to PACU RN and Post -op Vital signs reviewed and stable  Post vital signs: Reviewed and stable  Complications: No apparent anesthesia complications

## 2013-09-20 NOTE — Op Note (Signed)
[id number] 

## 2013-09-20 NOTE — Brief Op Note (Signed)
09/20/2013  11:32 AM  PATIENT:  [name] [name]  68 y.o. female  PRE-OPERATIVE DIAGNOSIS:  RIGHT WRIST PAIN DUE TO CPPDZ PISOTRIQUETAL ARTHRITIS  POST-OPERATIVE DIAGNOSIS:  RIGHT WRIST PAIN DUE TO CPPDZ /  [place] Go ARTHRITIS  PROCEDURE:  Procedure(s): RIGHT ARTHROSCOPY WRIST EXCISION PISIFORM (Right)  SURGEON:  Surgeon(s) and Role:    * Wyn Forster, MD - Primary  PHYSICIAN ASSISTANT:   ASSISTANTS: NURSE   ANESTHESIA:   general  EBL:  Total I/O In: 1000 [I.V.:1000] Out: -   BLOOD ADMINISTERED:none  DRAINS: none   LOCAL MEDICATIONS USED:  XYLOCAINE   SPECIMEN:  No Specimen  DISPOSITION OF SPECIMEN:  N/A  COUNTS:  YES  TOURNIQUET:   Total Tourniquet Time Documented: Upper Arm (Right) - 58 minutes Total: Upper Arm (Right) - 58 minutes   DICTATION: .Other Dictation: Dictation Number [id number]  PLAN OF CARE: Discharge to home after PACU  PATIENT DISPOSITION:  PACU - hemodynamically stable.   Delay start of Pharmacological VTE agent (>24hrs) due to surgical blood loss or risk of bleeding: not applicable
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
## 2013-09-24 ENCOUNTER — Encounter (HOSPITAL_BASED_OUTPATIENT_CLINIC_OR_DEPARTMENT_OTHER): Payer: Self-pay | Admitting: Orthopedic Surgery

## 2013-12-18 ENCOUNTER — Other Ambulatory Visit: Payer: Self-pay | Admitting: Orthopedic Surgery

## 2013-12-18 DIAGNOSIS — M25511 Pain in right shoulder: Secondary | ICD-10-CM

## 2013-12-21 ENCOUNTER — Other Ambulatory Visit: Payer: Medicare Other

## 2013-12-23 ENCOUNTER — Ambulatory Visit
Admission: RE | Admit: 2013-12-23 | Discharge: 2013-12-23 | Disposition: A | Discharge status: Home / Self Care | Payer: Medicare Other | Source: Ambulatory Visit | Attending: Orthopedic Surgery | Admitting: Orthopedic Surgery

## 2013-12-23 DIAGNOSIS — M25511 Pain in right shoulder: Secondary | ICD-10-CM

## 2014-04-29 ENCOUNTER — Other Ambulatory Visit: Payer: Self-pay | Admitting: Physician Assistant

## 2014-05-06 ENCOUNTER — Encounter (HOSPITAL_BASED_OUTPATIENT_CLINIC_OR_DEPARTMENT_OTHER): Payer: Self-pay | Admitting: *Deleted

## 2014-05-06 NOTE — Progress Notes (Signed)
To come in for ekg-bmet-pt had wrist surgery here 6/15-sees dr Jens Somcrenshaw for htn-ov 2/16-last echo 3/15 good To bring all meds

## 2014-05-08 ENCOUNTER — Other Ambulatory Visit: Payer: Self-pay

## 2014-05-08 ENCOUNTER — Encounter (HOSPITAL_BASED_OUTPATIENT_CLINIC_OR_DEPARTMENT_OTHER)
Admission: RE | Admit: 2014-05-08 | Discharge: 2014-05-08 | Disposition: A | Discharge status: Home / Self Care | Payer: Medicare Other | Source: Ambulatory Visit | Attending: Orthopedic Surgery | Admitting: Orthopedic Surgery

## 2014-05-08 DIAGNOSIS — M7541 Impingement syndrome of right shoulder: Secondary | ICD-10-CM | POA: Diagnosis not present

## 2014-05-08 DIAGNOSIS — M7521 Bicipital tendinitis, right shoulder: Secondary | ICD-10-CM | POA: Diagnosis not present

## 2014-05-08 DIAGNOSIS — S46011A Strain of muscle(s) and tendon(s) of the rotator cuff of right shoulder, initial encounter: Secondary | ICD-10-CM | POA: Diagnosis present

## 2014-05-08 DIAGNOSIS — M19011 Primary osteoarthritis, right shoulder: Secondary | ICD-10-CM | POA: Diagnosis not present

## 2014-05-08 DIAGNOSIS — I10 Essential (primary) hypertension: Secondary | ICD-10-CM | POA: Diagnosis not present

## 2014-05-08 LAB — BASIC METABOLIC PANEL
Anion gap: 7 (ref 5–15)
BUN: 10 mg/dL (ref 6–23)
CO2: 25 mmol/L (ref 19–32)
CREATININE: 0.88 mg/dL (ref 0.50–1.10)
Calcium: 9.3 mg/dL (ref 8.4–10.5)
Chloride: 106 mmol/L (ref 96–112)
GFR, EST AFRICAN AMERICAN: 75 mL/min — AB (ref >90)
GFR, EST NON AFRICAN AMERICAN: 65 mL/min — AB (ref >90)
GLUCOSE: 98 mg/dL (ref 70–99)
Potassium: 4.8 mmol/L (ref 3.5–5.1)
Sodium: 138 mmol/L (ref 135–145)
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
## 2014-05-08 NOTE — Progress Notes (Signed)
Noah DelaineS. Wilkins RN had Dr. [facility]enthalJackson [facility]

## 2014-05-09 ENCOUNTER — Encounter (HOSPITAL_BASED_OUTPATIENT_CLINIC_OR_DEPARTMENT_OTHER)
Admission: RE | Disposition: A | Discharge status: Home / Self Care | Payer: Self-pay | Source: Ambulatory Visit | Attending: Orthopedic Surgery

## 2014-05-09 ENCOUNTER — Ambulatory Visit (HOSPITAL_BASED_OUTPATIENT_CLINIC_OR_DEPARTMENT_OTHER)
Admission: RE | Admit: 2014-05-09 | Discharge: 2014-05-09 | Disposition: A | Discharge status: Home / Self Care | Payer: Medicare Other | Source: Ambulatory Visit | Attending: Orthopedic Surgery | Admitting: Orthopedic Surgery

## 2014-05-09 ENCOUNTER — Ambulatory Visit (HOSPITAL_BASED_OUTPATIENT_CLINIC_OR_DEPARTMENT_OTHER): Payer: Medicare Other | Admitting: Anesthesiology

## 2014-05-09 ENCOUNTER — Encounter (HOSPITAL_BASED_OUTPATIENT_CLINIC_OR_DEPARTMENT_OTHER): Payer: Self-pay

## 2014-05-09 DIAGNOSIS — M19011 Primary osteoarthritis, right shoulder: Secondary | ICD-10-CM | POA: Insufficient documentation

## 2014-05-09 DIAGNOSIS — M7541 Impingement syndrome of right shoulder: Secondary | ICD-10-CM | POA: Insufficient documentation

## 2014-05-09 DIAGNOSIS — M7521 Bicipital tendinitis, right shoulder: Secondary | ICD-10-CM | POA: Insufficient documentation

## 2014-05-09 DIAGNOSIS — I10 Essential (primary) hypertension: Secondary | ICD-10-CM | POA: Insufficient documentation

## 2014-05-09 DIAGNOSIS — S46011A Strain of muscle(s) and tendon(s) of the rotator cuff of right shoulder, initial encounter: Secondary | ICD-10-CM | POA: Diagnosis not present

## 2014-05-09 HISTORY — PX: SHOULDER ACROMIOPLASTY: SHX6093

## 2014-05-09 HISTORY — PX: SHOULDER ARTHROSCOPY WITH BICEPSTENOTOMY: SHX6204

## 2014-05-09 HISTORY — DX: Other specified postprocedural states: Z98.890

## 2014-05-09 HISTORY — DX: Nausea with vomiting, unspecified: R11.2

## 2014-05-09 SURGERY — SHOULDER ARTHROSCOPY WITH SUBACROMIAL DECOMPRESSION AND DISTAL CLAVICLE EXCISION
Anesthesia: Regional | Site: Shoulder | Laterality: Right

## 2014-05-09 MED ORDER — OXYCODONE HCL 5 MG/5ML PO SOLN: 5.0000 mg | Freq: Once | ORAL | Status: DC | PRN

## 2014-05-09 MED ORDER — PROPOFOL 10 MG/ML IV BOLUS
INTRAVENOUS | Status: DC | PRN
  Administered 2014-05-09: 140 mg via INTRAVENOUS

## 2014-05-09 MED ORDER — MIDAZOLAM HCL 2 MG/2ML IJ SOLN
1.0000 mg | INTRAMUSCULAR | Status: DC | PRN
  Administered 2014-05-09: 2 mg via INTRAVENOUS

## 2014-05-09 MED ORDER — OXYCODONE HCL 5 MG PO TABS: 5.0000 mg | ORAL_TABLET | Freq: Once | ORAL | Status: DC | PRN

## 2014-05-09 MED ORDER — LACTATED RINGERS IV SOLN: INTRAVENOUS | Status: DC

## 2014-05-09 MED ORDER — ONDANSETRON HCL 4 MG PO TABS: 4.0000 mg | ORAL_TABLET | Freq: Three times a day (TID) | ORAL | Status: DC | PRN

## 2014-05-09 MED ORDER — PROMETHAZINE HCL 25 MG/ML IJ SOLN
6.2500 mg | INTRAMUSCULAR | Status: DC | PRN
  Administered 2014-05-09: 6.25 mg via INTRAVENOUS

## 2014-05-09 MED ORDER — MEPERIDINE HCL 25 MG/ML IJ SOLN: 6.2500 mg | INTRAMUSCULAR | Status: DC | PRN

## 2014-05-09 MED ORDER — PROMETHAZINE HCL 25 MG/ML IJ SOLN
INTRAMUSCULAR | Status: AC
  Filled 2014-05-09: qty 1

## 2014-05-09 MED ORDER — CLINDAMYCIN PHOSPHATE 900 MG/50ML IV SOLN
INTRAVENOUS | Status: AC
  Filled 2014-05-09: qty 50

## 2014-05-09 MED ORDER — ONDANSETRON HCL 4 MG/2ML IJ SOLN
INTRAMUSCULAR | Status: DC | PRN
  Administered 2014-05-09: 4 mg via INTRAVENOUS

## 2014-05-09 MED ORDER — CHLORHEXIDINE GLUCONATE 4 % EX LIQD: 60.0000 mL | Freq: Once | CUTANEOUS | Status: DC

## 2014-05-09 MED ORDER — LIDOCAINE HCL 4 % MT SOLN
OROMUCOSAL | Status: DC | PRN
  Administered 2014-05-09: 2 mL via TOPICAL

## 2014-05-09 MED ORDER — FENTANYL CITRATE 0.05 MG/ML IJ SOLN
INTRAMUSCULAR | Status: AC
  Filled 2014-05-09: qty 2

## 2014-05-09 MED ORDER — SUCCINYLCHOLINE CHLORIDE 20 MG/ML IJ SOLN
INTRAMUSCULAR | Status: DC | PRN
  Administered 2014-05-09: 100 mg via INTRAVENOUS

## 2014-05-09 MED ORDER — LACTATED RINGERS IV SOLN
INTRAVENOUS | Status: DC
  Administered 2014-05-09 (×2): via INTRAVENOUS

## 2014-05-09 MED ORDER — DEXAMETHASONE SODIUM PHOSPHATE 4 MG/ML IJ SOLN
INTRAMUSCULAR | Status: DC | PRN
  Administered 2014-05-09: 10 mg via INTRAVENOUS

## 2014-05-09 MED ORDER — CLINDAMYCIN PHOSPHATE 900 MG/50ML IV SOLN
900.0000 mg | INTRAVENOUS | Status: AC
  Administered 2014-05-09: 900 mg via INTRAVENOUS

## 2014-05-09 MED ORDER — FENTANYL CITRATE 0.05 MG/ML IJ SOLN
INTRAMUSCULAR | Status: AC
  Filled 2014-05-09: qty 6

## 2014-05-09 MED ORDER — HYDROMORPHONE HCL 2 MG PO TABS: 2.0000 mg | ORAL_TABLET | ORAL | Status: DC | PRN

## 2014-05-09 MED ORDER — PHENYLEPHRINE HCL 10 MG/ML IJ SOLN
10.0000 mg | INTRAVENOUS | Status: DC | PRN
  Administered 2014-05-09: 50 ug/min via INTRAVENOUS

## 2014-05-09 MED ORDER — FENTANYL CITRATE 0.05 MG/ML IJ SOLN
50.0000 ug | INTRAMUSCULAR | Status: DC | PRN
  Administered 2014-05-09: 100 ug via INTRAVENOUS

## 2014-05-09 MED ORDER — SCOPOLAMINE 1 MG/3DAYS TD PT72
MEDICATED_PATCH | TRANSDERMAL | Status: AC
  Filled 2014-05-09: qty 1

## 2014-05-09 MED ORDER — SCOPOLAMINE 1 MG/3DAYS TD PT72
1.0000 | MEDICATED_PATCH | TRANSDERMAL | Status: DC
  Administered 2014-05-09: 1.5 mg via TRANSDERMAL

## 2014-05-09 MED ORDER — BUPIVACAINE-EPINEPHRINE (PF) 0.5% -1:200000 IJ SOLN
INTRAMUSCULAR | Status: DC | PRN
  Administered 2014-05-09: 30 mL via PERINEURAL

## 2014-05-09 MED ORDER — MIDAZOLAM HCL 2 MG/2ML IJ SOLN
INTRAMUSCULAR | Status: AC
  Filled 2014-05-09: qty 2

## 2014-05-09 MED ORDER — HYDROMORPHONE HCL 1 MG/ML IJ SOLN: 0.2500 mg | INTRAMUSCULAR | Status: DC | PRN

## 2014-05-09 MED ORDER — SODIUM CHLORIDE 0.9 % IR SOLN
Status: DC | PRN
  Administered 2014-05-09: 6000 mL

## 2014-05-09 SURGICAL SUPPLY — 76 items
APL SKNCLS STERI-STRIP NONHPOA (GAUZE/BANDAGES/DRESSINGS)
BENZOIN TINCTURE PRP APPL 2/3 (GAUZE/BANDAGES/DRESSINGS) IMPLANT
BLADE CUTTER GATOR 3.5 (BLADE) ×3 IMPLANT
BLADE CUTTER MENIS 5.5 (BLADE) IMPLANT
BLADE GREAT WHITE 4.2 (BLADE) ×2 IMPLANT
BLADE GREAT WHITE 4.2MM (BLADE) ×1
BLADE SURG 15 STRL LF DISP TIS (BLADE) IMPLANT
BLADE SURG 15 STRL SS (BLADE)
BUR OVAL 6.0 (BURR) ×3 IMPLANT
CANISTER SUCT 3000ML (MISCELLANEOUS) IMPLANT
CANNULA DRY DOC 8X75 (CANNULA) IMPLANT
CANNULA TWIST IN 8.25X7CM (CANNULA) IMPLANT
CLOSURE WOUND 1/2 X4 (GAUZE/BANDAGES/DRESSINGS)
DECANTER SPIKE VIAL GLASS SM (MISCELLANEOUS) IMPLANT
DRAPE STERI 35X30 U-POUCH (DRAPES) ×3 IMPLANT
DRAPE U-SHAPE 47X51 STRL (DRAPES) ×3 IMPLANT
DRAPE U-SHAPE 76X120 STRL (DRAPES) ×6 IMPLANT
DRSG PAD ABDOMINAL 8X10 ST (GAUZE/BANDAGES/DRESSINGS) ×3 IMPLANT
DURAPREP 26ML APPLICATOR (WOUND CARE) ×3 IMPLANT
ELECT MENISCUS 165MM 90D (ELECTRODE) ×3 IMPLANT
ELECT NEEDLE TIP 2.8 STRL (NEEDLE) IMPLANT
ELECT REM PT RETURN 9FT ADLT (ELECTROSURGICAL) ×3
ELECTRODE REM PT RTRN 9FT ADLT (ELECTROSURGICAL) ×1 IMPLANT
GAUZE SPONGE 4X4 12PLY STRL (GAUZE/BANDAGES/DRESSINGS) ×6 IMPLANT
GAUZE XEROFORM 1X8 LF (GAUZE/BANDAGES/DRESSINGS) ×3 IMPLANT
GLOVE BIOGEL M STRL SZ7.5 (GLOVE) ×3 IMPLANT
GLOVE BIOGEL PI IND STRL 7.0 (GLOVE) ×2 IMPLANT
GLOVE BIOGEL PI IND STRL 8 (GLOVE) ×1 IMPLANT
GLOVE BIOGEL PI INDICATOR 7.0 (GLOVE) ×4
GLOVE BIOGEL PI INDICATOR 8 (GLOVE) ×2
GLOVE ECLIPSE 6.5 STRL STRAW (GLOVE) ×3 IMPLANT
GLOVE ECLIPSE 7.0 STRL STRAW (GLOVE) ×3 IMPLANT
GLOVE EXAM NITRILE MD LF STRL (GLOVE) ×3 IMPLANT
GLOVE ORTHO TXT STRL SZ7.5 (GLOVE) ×3 IMPLANT
GOWN STRL REUS W/ TWL LRG LVL3 (GOWN DISPOSABLE) ×3 IMPLANT
GOWN STRL REUS W/ TWL XL LVL3 (GOWN DISPOSABLE) ×1 IMPLANT
GOWN STRL REUS W/TWL LRG LVL3 (GOWN DISPOSABLE) ×9
GOWN STRL REUS W/TWL XL LVL3 (GOWN DISPOSABLE) ×3
IV NS IRRIG 3000ML ARTHROMATIC (IV SOLUTION) ×9 IMPLANT
MANIFOLD NEPTUNE II (INSTRUMENTS) ×3 IMPLANT
NDL SUT 6 .5 CRC .975X.05 MAYO (NEEDLE) IMPLANT
NEEDLE MAYO TAPER (NEEDLE)
NEEDLE SCORPION MULTI FIRE (NEEDLE) IMPLANT
NS IRRIG 1000ML POUR BTL (IV SOLUTION) IMPLANT
PACK ARTHROSCOPY DSU (CUSTOM PROCEDURE TRAY) ×3 IMPLANT
PACK BASIN DAY SURGERY FS (CUSTOM PROCEDURE TRAY) ×3 IMPLANT
PASSER SUT SWANSON 36MM LOOP (INSTRUMENTS) IMPLANT
PENCIL BUTTON HOLSTER BLD 10FT (ELECTRODE) ×3 IMPLANT
SET ARTHROSCOPY TUBING (MISCELLANEOUS) ×3
SET ARTHROSCOPY TUBING LN (MISCELLANEOUS) ×1 IMPLANT
SLEEVE SCD COMPRESS KNEE MED (MISCELLANEOUS) ×3 IMPLANT
SLING ARM IMMOBILIZER LRG (SOFTGOODS) IMPLANT
SLING ARM IMMOBILIZER MED (SOFTGOODS) IMPLANT
SLING ARM LRG ADULT FOAM STRAP (SOFTGOODS) ×3 IMPLANT
SLING ARM MED ADULT FOAM STRAP (SOFTGOODS) IMPLANT
SLING ARM XL FOAM STRAP (SOFTGOODS) IMPLANT
SPONGE LAP 4X18 X RAY DECT (DISPOSABLE) IMPLANT
STRIP CLOSURE SKIN 1/2X4 (GAUZE/BANDAGES/DRESSINGS) IMPLANT
SUCTION FRAZIER TIP 10 FR DISP (SUCTIONS) IMPLANT
SUT ETHIBOND 2 OS 4 DA (SUTURE) IMPLANT
SUT ETHILON 2 0 FS 18 (SUTURE) IMPLANT
SUT ETHILON 3 0 PS 1 (SUTURE) ×3 IMPLANT
SUT FIBERWIRE #2 38 T-5 BLUE (SUTURE)
SUT RETRIEVER MED (INSTRUMENTS) IMPLANT
SUT TIGER TAPE 7 IN WHITE (SUTURE) IMPLANT
SUT VIC AB 0 CT1 27 (SUTURE)
SUT VIC AB 0 CT1 27XBRD ANBCTR (SUTURE) IMPLANT
SUT VIC AB 2-0 SH 27 (SUTURE)
SUT VIC AB 2-0 SH 27XBRD (SUTURE) IMPLANT
SUT VIC AB 3-0 FS2 27 (SUTURE) IMPLANT
SUTURE FIBERWR #2 38 T-5 BLUE (SUTURE) IMPLANT
TAPE FIBER 2MM 7IN #2 BLUE (SUTURE) IMPLANT
TOWEL OR 17X24 6PK STRL BLUE (TOWEL DISPOSABLE) ×3 IMPLANT
TOWEL OR NON WOVEN STRL DISP B (DISPOSABLE) ×3 IMPLANT
WATER STERILE IRR 1000ML POUR (IV SOLUTION) ×3 IMPLANT
YANKAUER SUCT BULB TIP NO VENT (SUCTIONS) IMPLANT

## 2014-05-09 NOTE — Progress Notes (Signed)
Assisted Dr. Singer with right, ultrasound guided, interscalene  block. Side rails up, monitors on throughout procedure. See vital signs in flow sheet. Tolerated Procedure well. 

## 2014-05-09 NOTE — Transfer of Care (Signed)
Immediate Anesthesia Transfer of Care Note  Patient: [name] [name]  Procedure(s) Performed: Procedure(s) with comments: RIGHT SHOULDER SCOPE WITH DEBRIDEMENT/DISTAL CLAVICLE EXCISION/SUBACROMIAL DECOMPRESSION, ROTATOR CUFF REPAIR AND BICEPS TENODESIS (Right) - ANESTHESIA:  GENERAL, PRE/POST OP SCALENE  Patient Location: PACU  Anesthesia Type:GA combined with regional for post-op pain  Level of Consciousness: awake, alert  and patient cooperative  Airway & Oxygen Therapy: Patient Spontanous Breathing and Patient connected to face mask oxygen  Post-op Assessment: Report given to PACU RN, Post -op Vital signs reviewed and stable and Patient moving all extremities  Post vital signs: Reviewed and stable  Last Vitals:  Filed Vitals:   05/09/14 1229  BP: 149/81  Pulse: 94  Temp:   Resp:     Complications: No apparent anesthesia complications

## 2014-05-09 NOTE — Anesthesia Postprocedure Evaluation (Signed)
Anesthesia Post Note  Patient: [name] [name]  Procedure(s) Performed: Procedure(s) (LRB): SHOULDER ARTHROSCOPY WITH SUBACROMIAL DECOMPRESSION AND DISTAL CLAVICLE EXCISION (Right) SHOULDER ARTHROSCOPY WITH BICEPSTENOTOMY (Right) SHOULDER ACROMIOPLASTY (Right)  Anesthesia type: general  Patient location: PACU  Post pain: Pain level controlled  Post assessment: Patient's Cardiovascular Status Stable  Last Vitals:  Filed Vitals:   05/09/14 1330  BP: 145/77  Pulse: 67  Temp:   Resp: 16    Post vital signs: Reviewed and stable  Level of consciousness: sedated  Complications: No apparent anesthesia complications

## 2014-05-09 NOTE — Anesthesia Preprocedure Evaluation (Addendum)
Anesthesia Evaluation  Patient identified by MRN, date of birth, ID band Patient awake    Reviewed: Allergy & Precautions, H&P , NPO status , Patient's Chart, lab work & pertinent test results, reviewed documented beta blocker date and time   Airway Mallampati: II  TM Distance: >3 FB Neck ROM: full    Dental  (+) Implants, Dental Advidsory Given   Pulmonary asthma ,  breath sounds clear to auscultation        Cardiovascular hypertension, Pt. on medications negative cardio ROS  Rhythm:regular  Echo 06/19/2013 Study Conclusions  - Left ventricle: The cavity size was normal. There was mild focal basal hypertrophy of the septum. Systolic function was normal. The estimated ejection fraction was in the range of 55% to 60%. Wall motion was normal   Neuro/Psych negative neurological ROS  negative psych ROS   GI/Hepatic negative GI ROS, Neg liver ROS,   Endo/Other  negative endocrine ROS  Renal/GU negative Renal ROS  negative genitourinary   Musculoskeletal  (+) Arthritis -,   Abdominal   Peds  Hematology negative hematology ROS (+)   Anesthesia Other Findings See surgeon's H&P   Reproductive/Obstetrics negative OB ROS                          Anesthesia Physical  Anesthesia Plan  ASA: III  Anesthesia Plan: Regional and General ETT   Post-op Pain Management:    Induction: Intravenous  Airway Management Planned: LMA  Additional Equipment:   Intra-op Plan:   Post-operative Plan: Extubation in OR  Informed Consent: I have reviewed the patients History and Physical, chart, labs and discussed the procedure including the risks, benefits and alternatives for the proposed anesthesia with the patient or authorized representative who has indicated his/her understanding and acceptance.   Dental advisory given  Plan Discussed with: CRNA and Anesthesiologist  Anesthesia Plan Comments:        Anesthesia Quick Evaluation

## 2014-05-09 NOTE — Interval H&P Note (Signed)
History and Physical Interval Note:  05/09/2014 7:27 AM  [name] [name]  has presented today for surgery, with the diagnosis of OTHER ARTICULAR CARTILAGE DISORDERS RIGHT SHOULDER/PRIMARY OSTEOARTHRITIS RIGHT SHOULDER/BURSITIS OF RIGHT SHOULDER/COMPLETE ROTATOR CUFF TEAR OR RUPTURE OF RIGHT SHOULDER NOT SPECIFIED  The various methods of treatment have been discussed with the patient and family. After consideration of risks, benefits and other options for treatment, the patient has consented to  Procedure(s) with comments: RIGHT SHOULDER SCOPE WITH DEBRIDEMENT/DISTAL CLAVICLE EXCISION/SUBACROMIAL DECOMPRESSION, ROTATOR CUFF REPAIR AND BICEPS TENODESIS (Right) - ANESTHESIA:  GENERAL, PRE/POST OP SCALENE as a surgical intervention .  The patient's history has been reviewed, patient examined, no change in status, stable for surgery.  I have reviewed the patient's chart and labs.  Questions were answered to the patient's satisfaction.     Blade Scheff F

## 2014-05-09 NOTE — Anesthesia Procedure Notes (Addendum)
Anesthesia Regional Block:  Interscalene brachial plexus block  Pre-Anesthetic Checklist: ,, timeout performed, Correct Patient, Correct Site, Correct Laterality, Correct Procedure, Correct Position, site marked, Risks and benefits discussed,  Surgical consent,  Pre-op evaluation,  At surgeon's request and post-op pain management  Laterality: Right  Prep: chloraprep       Needles:  Injection technique: Single-shot  Needle Type: Echogenic Stimulator Needle     Needle Length: 5cm 5 cm Needle Gauge: 22 and 22 G    Additional Needles:  Procedures: ultrasound guided (picture in chart) and nerve stimulator Interscalene brachial plexus block  Nerve Stimulator or Paresthesia:  Response: bicep contraction, 0.45 mA,   Additional Responses:   Narrative:  Start time: 05/09/2014 10:38 AM End time: 05/09/2014 10:48 AM Injection made incrementally with aspirations every 5 mL.  Performed by: Personally  Anesthesiologist: Duane Boston  Additional Notes: Functioning IV was confirmed and monitors applied.  A 56m 22ga echogenic arrow stimulator was used. Sterile prep and drape,hand hygiene and sterile gloves were used.Ultrasound guidance: relevant anatomy identified, needle position confirmed, local anesthetic spread visualized around nerve(s)., vascular puncture avoided.  Image printed for medical record.  Negative aspiration and negative test dose prior to incremental administration of local anesthetic. The patient tolerated the procedure well.   Procedure Name: Intubation Date/Time: 05/09/2014 11:33 AM Performed by: CBaxter FlatteryPre-anesthesia Checklist: Patient identified, Emergency Drugs available, Suction available and Patient being monitored Patient Re-evaluated:Patient Re-evaluated prior to inductionOxygen Delivery Method: Circle System Utilized Preoxygenation: Pre-oxygenation with 100% oxygen Intubation Type: IV induction Ventilation: Mask ventilation without  difficulty Laryngoscope Size: Miller and 2 Grade View: Grade II Tube type: Oral Number of attempts: 1 Airway Equipment and Method: Stylet and LTA kit utilized Placement Confirmation: ETT inserted through vocal cords under direct vision,  positive ETCO2 and breath sounds checked- equal and bilateral Secured at: 21 cm Tube secured with: Tape Dental Injury: Teeth and Oropharynx as per pre-operative assessment

## 2014-05-09 NOTE — Discharge Instructions (Signed)
Shouder arthroscopy, subacromial decompression Care After Instructions Refer to this sheet in the next few weeks. These discharge instructions provide you with general information on caring for yourself after you leave the hospital. Your caregiver may also give you specific instructions. Your treatment has been planned according to the most current medical practices available, but unavoidable complications sometimes occur. If you have any problems or questions after discharge, please call your caregiver. HOME INSTRUCTIONS You may resume a normal diet and activities as directed.  Take showers instead of baths until informed otherwise.  Change bandages (dressings) in 3 days.  Swab wounds daily with betadine.  Wash shoulder with soap and water.  Pat dry.  Cover wounds with bandaids. Only take over-the-counter or prescription medicines for pain, discomfort, or fever as directed by your caregiver.  Wear your sling for the next 2 days unless otherwise instructed. Eat a well-balanced diet.  Avoid lifting or driving until you are instructed otherwise.  Make an appointment to see your caregiver for stitches (suture) or staple removal as directed.   SEEK MEDICAL CARE IF: You have swelling of your calf or leg.  You develop shortness of breath or chest pain.  You have redness, swelling, or increasing pain in the wound.  There is pus or any unusual drainage coming from the surgical site.  You notice a bad smell coming from the surgical site or dressing.  The surgical site breaks open after sutures or staples have been removed.  There is persistent bleeding from the suture or staple line.  You are getting worse or are not improving.  You have any other questions or concerns.  SEEK IMMEDIATE MEDICAL CARE IF:  You have a fever greater than 101 You develop a rash.  You have difficulty breathing.  You develop any reaction or side effects to medicines given.  Your knee motion is decreasing rather than  improving.  MAKE SURE YOU:  Understand these instructions.  Will watch your condition.  Will get help right away if you are not doing well or get worse.    Regional Anesthesia Blocks  1. Numbness or the inability to move the "blocked" extremity may last from 3-48 hours after placement. The length of time depends on the medication injected and your individual response to the medication. If the numbness is not going away after 48 hours, call your surgeon.  2. The extremity that is blocked will need to be protected until the numbness is gone and the  Strength has returned. Because you cannot feel it, you will need to take extra care to avoid injury. Because it may be weak, you may have difficulty moving it or using it. You may not know what position it is in without looking at it while the block is in effect.  3. For blocks in the legs and feet, returning to weight bearing and walking needs to be done carefully. You will need to wait until the numbness is entirely gone and the strength has returned. You should be able to move your leg and foot normally before you try and bear weight or walk. You will need someone to be with you when you first try to ensure you do not fall and possibly risk injury.  4. Bruising and tenderness at the needle site are common side effects and will resolve in a few days.  5. Persistent numbness or new problems with movement should be communicated to the surgeon or the [facility] [phone])/ [facility] [phone]).  Post Anesthesia Home Care Instructions  Activity: Get plenty of rest for the remainder of the day. A responsible adult should stay with you for 24 hours following the procedure.  For the next 24 hours, DO NOT: -Drive a car -Paediatric nurse -Drink alcoholic beverages -Take any medication unless instructed by your physician -Make any legal decisions or sign important papers.  Meals: Start with liquid foods such  as gelatin or soup. Progress to regular foods as tolerated. Avoid greasy, spicy, heavy foods. If nausea and/or vomiting occur, drink only clear liquids until the nausea and/or vomiting subsides. Call your physician if vomiting continues.  Special Instructions/Symptoms: Your throat may feel dry or sore from the anesthesia or the breathing tube placed in your throat during surgery. If this causes discomfort, gargle with warm salt water. The discomfort should disappear within 24 hours.

## 2014-05-10 NOTE — Op Note (Signed)
NAMLouellen Molder:  [name], [name]                  ACCOUNT NO.:  [id number]  MEDICAL RECORD NO.:  [id number]  LOCATION:                                 FACILITY:  PHYSICIAN:  Loreta Aveaniel F. Crystall Donaldson, M.D. DATE OF BIRTH:  1945-09-22  DATE OF PROCEDURE:  05/09/2014 DATE OF DISCHARGE:  05/09/2014                              OPERATIVE REPORT   PREOPERATIVE DIAGNOSES:  Traumatic impingement, right shoulder with partial tearing rotator cuff.  Tendinosis, biceps tendon.  Degenerative joint disease, acromioclavicular joint.  POSTOPERATIVE DIAGNOSIS:  Traumatic impingement, right shoulder with partial tearing rotator cuff.  Tendinosis, biceps tendon.  Degenerative joint disease, acromioclavicular joint with considerable partial- thickness tearing top border of the subscap as well as the supraspinatus above and below.  Nothing full-thickness.  Circumferential degenerative tearing labrum.  Tendinopathy tearing biceps at glenoid attachment. Grade 4 changes, acromioclavicular joint.  PROCEDURE: 1. Right shoulder exam under anesthesia, arthroscopy.  Debridement of     rotator cuff and labrum.  Release, resection intra-articular     portion, biceps.  Bursectomy, acromioplasty, coracoacromial     ligament release.  Excision of distal clavicle.  SURGEON:  Loreta Aveaniel F. Skylynn Burkley, MD  ASSISTANT:  Odelia GageLindsey Anton, PA, present throughout the entire case and necessary for timely completion of procedure.  ANESTHESIA:  General.  BLOOD LOSS:  Minimal.  SPECIMENS:  None.  CULTURES:  None.  COMPLICATIONS:  None.  DRESSINGS:  Soft compressive with sling.  PROCEDURE IN DETAIL:  The patient was brought to operating room, placed on the operating table in supine position.  After adequate anesthesia had been obtained, shoulder examined.  A little stiffer basically full motion, stable shoulder.  Placed in a beach-chair position on the shoulder positioner, prepped and draped in usual sterile fashion.  Three portals, anterior,  posterior, lateral.  Arthroscope introduced, shoulder distended and inspected.  Crystalline deposition from previous cortisone, debrided.  Circumferential degenerative tearing of labrum debrided.  Grade 2 mild, grade 3 changes of the shoulder.  Biceps intact, but allowed the generation via the attachment of the glenoid. Intra-articular portion resected, released all the tenodesis.  Debrided the top of the subscap as well as the bottom of the supraspinatus. Again nothing full-thickness.  Cannula redirected subacromially.  Bursa resected.  The cuff debrided and thoroughly assessed.  Acromioplasty from type 2 to type 1 acromion releasing CA ligament. Periarticular spurs, the lateral centimeter of clavicle resected. Adequacy of decompression, debridement confirmed viewing from all portals.  Instruments were fully removed.  Portals were closed with nylon.  Sterile compressive dressing applied.  Anesthesia reversed. Brought to the recovery room.  Tolerated the surgery well.  No complications.     Loreta Aveaniel F. Shauni Henner, M.D.     DFM/MEDQ  D:  05/09/2014  T:  05/10/2014  Job:  [id number]

## 2014-05-13 ENCOUNTER — Encounter (HOSPITAL_BASED_OUTPATIENT_CLINIC_OR_DEPARTMENT_OTHER): Payer: Self-pay | Admitting: Orthopedic Surgery

## 2014-05-21 ENCOUNTER — Ambulatory Visit: Payer: Medicare Other | Attending: Orthopedic Surgery | Admitting: Rehabilitation

## 2014-05-21 DIAGNOSIS — M25611 Stiffness of right shoulder, not elsewhere classified: Secondary | ICD-10-CM | POA: Insufficient documentation

## 2014-05-21 DIAGNOSIS — Z9889 Other specified postprocedural states: Secondary | ICD-10-CM | POA: Diagnosis not present

## 2014-05-21 DIAGNOSIS — M25511 Pain in right shoulder: Secondary | ICD-10-CM | POA: Diagnosis present

## 2014-05-22 ENCOUNTER — Ambulatory Visit: Payer: Medicare Other | Admitting: Rehabilitation

## 2014-05-22 ENCOUNTER — Ambulatory Visit: Payer: Medicare Other | Admitting: Cardiology

## 2014-05-22 DIAGNOSIS — M25511 Pain in right shoulder: Secondary | ICD-10-CM | POA: Diagnosis not present

## 2014-05-24 ENCOUNTER — Ambulatory Visit: Payer: Medicare Other | Admitting: Physical Therapy

## 2014-05-24 DIAGNOSIS — M25511 Pain in right shoulder: Secondary | ICD-10-CM | POA: Diagnosis not present

## 2014-05-27 ENCOUNTER — Ambulatory Visit: Payer: Medicare Other | Admitting: Rehabilitation

## 2014-05-29 ENCOUNTER — Encounter: Payer: Self-pay | Admitting: Physical Therapy

## 2014-05-29 ENCOUNTER — Ambulatory Visit: Payer: Medicare Other | Admitting: Physical Therapy

## 2014-05-29 DIAGNOSIS — M25511 Pain in right shoulder: Secondary | ICD-10-CM | POA: Diagnosis not present

## 2014-05-29 DIAGNOSIS — M25611 Stiffness of right shoulder, not elsewhere classified: Secondary | ICD-10-CM

## 2014-05-29 NOTE — Therapy (Signed)
[facility] [place] [address]  [address] [place], [place], [address] Phone: [phone]   Fax:  [phone]  Physical Therapy Treatment  Patient Details  Name: [name] [name] MRN: [id number] Date of Birth: 09/12/1945 Referring Provider:  Loreta AveMurphy, Daniel F, MD  Encounter Date: 05/29/2014      PT End of Session - 05/29/14 1026    Visit Number 4   Number of Visits 16   Date for PT Re-Evaluation 07/16/14   PT Start Time 0930   PT Stop Time 1035   PT Time Calculation (min) 65 min      Past Medical History  Diagnosis Date  . Allergy   . Hypertension   . Hyperlipidemia   . IBS (irritable bowel syndrome)   . Arthritis   . Full dentures   . Wears glasses     readers  . Asthma     no inhalers  . PONV (postoperative nausea and vomiting)     Past Surgical History  Procedure Laterality Date  . Shoulder surgery      left  . Elbow surgery    . Knee surgery      lt  . Carpal tunnel release      rt  . Varicose vein surgery    . Nasal septum surgery    . Abdominal hysterectomy    . Cholecystectomy    . Appendectomy    . Tonsillectomy and adenoidectomy    . Breast lumpectomy  1985    lt  . Eye surgery      both cataracts  . Wrist arthroscopy Right 09/20/2013    Procedure: RIGHT ARTHROSCOPY WRIST EXCISION PISIFORM;  Surgeon: [name], MD;  Location: [facility];  Service: Orthopedics;  Laterality: Right;  . Shoulder arthroscopy with bicepstenotomy Right 05/09/2014    Procedure: SHOULDER ARTHROSCOPY WITH BICEPSTENOTOMY;  Surgeon: [name], MD;  Location: [facility];  Service: Orthopedics;  Laterality: Right;  Debridement Rotator Cuff Tear  . Shoulder acromioplasty Right 05/09/2014    Procedure: SHOULDER ACROMIOPLASTY;  Surgeon: [name], MD;  Location: [facility];  Service: Orthopedics;  Laterality: Right;    There were no vitals taken for this visit.  Visit  Diagnosis:  Pain in joint, shoulder region, right  Limitation of joint motion of right shoulder      Subjective Assessment - 05/29/14 0938    Symptoms States noted intense pain yesterday for unknown reason.  Pain noted mostly to anterior aspect of R Shoulder and she states pain was 10/10.  She rates pain 6/10 today.   Currently in Pain? Yes   Pain Score 6    Pain Location Shoulder   Pain Orientation Right  anterior aspect   Aggravating Factors  weather, movement   Pain Relieving Factors rest, meds, ice   Multiple Pain Sites No          [facility] PT Assessment - 05/29/14 0001    PROM   Right Shoulder Flexion 138 Degrees   Right Shoulder ABduction 90 Degrees   Right Shoulder Internal Rotation 58 Degrees   Right Shoulder External Rotation 70 Degrees                  [facility] Adult PT Treatment/Exercise - 05/29/14 0001    Exercises   Exercises Shoulder   Shoulder Exercises: Standing   Flexion Right;10 reps;AAROM  Hand on wall with towel   Shoulder Exercises: Pulleys  Flexion 1 minute   Shoulder Exercises: Isometric Strengthening   Flexion Supine;Theraband  Diagonal Flexion 3x (stopped due to pain)   Theraband Level (Flexion) Level 1 (Yellow)   Extension Supine;Theraband  Diagonal Extension 12x   Theraband Level (Extension) Level 1 (Yellow)   ABduction Supine;Theraband  Horizontal Abduction 12x   Theraband Level (ABduction) Level 1 (Yellow)   Manual Therapy   Manual Therapy Joint mobilization   Joint Mobilization R Shoulder A/P Grade 2-3 with gentle distraction and PROM all planes to tolerance.                     PT Long Term Goals - 05/29/14 6962    PT LONG TERM GOAL #1   Title independent with advanced HEP by 07/16/14   Status On-going   PT LONG TERM GOAL #2   Title R shoulder AROM to [facility] for all ADLs by 07/16/14   Status On-going   PT LONG TERM GOAL #3   Title R Shoulder MMT 4/5 or greater to assist with improved ADL status by 07/16/14   Status  On-going   PT LONG TERM GOAL #4   Title return to pool for recreation/exercise with limitations as necessary by 07/16/14   Status On-going   PT LONG TERM GOAL #5   Title pt demonstate/verbalize info on physical activity to reduce risk of re-injury by 07/16/14   Status On-going               Plan - 05/29/14 1027    Clinical Impression Statement pt tolerating treatments fairly well but all ROM and manual therapy still limited by pain.  Her PROM and AAROM have improved but AROM still nonfunctional and very painful.   Pt will benefit from skilled therapeutic intervention in order to improve on the following deficits Decreased range of motion;Pain;Impaired UE functional use;Decreased mobility;Decreased strength   Rehab Potential Good   PT Frequency 3x / week  2-3x/wk for 8 weeks beginning 05/21/14   PT Duration 8 weeks   PT Treatment/Interventions Moist Heat;Therapeutic activities;Patient/family education;Therapeutic exercise;Passive range of motion;Manual techniques;Electrical Stimulation;Cryotherapy   PT Next Visit Plan progress HEP   Consulted and Agree with Plan of Care Patient        Problem List Patient Active Problem List   Diagnosis Date Noted  . Chest pain 05/09/2013  . Hypertension 02/23/2012  . Hyperlipidemia 02/23/2012  . Right shoulder pain 01/25/2012  . Left wrist pain 01/16/2012  . Neck pain 12/26/2011    Jehad Bisono PT, OCS  05/29/2014, 10:41 AM  [facility] [address]  [address] [place], [place], [address] Phone: [phone]   Fax:  [phone]

## 2014-05-31 ENCOUNTER — Ambulatory Visit: Payer: Medicare Other | Admitting: Rehabilitation

## 2014-05-31 DIAGNOSIS — M25611 Stiffness of right shoulder, not elsewhere classified: Secondary | ICD-10-CM

## 2014-05-31 DIAGNOSIS — M25511 Pain in right shoulder: Secondary | ICD-10-CM

## 2014-05-31 NOTE — Patient Instructions (Signed)
Okay to add standing cane flexion and abduction to HEP.

## 2014-05-31 NOTE — Therapy (Signed)
[facility] [place] [address]  [address] [place], [place], [address] Phone: [phone]   Fax:  [phone]  Physical Therapy Treatment  Patient Details  Name: [name] [name] MRN: [id number] Date of Birth: November 30, 1945 Referring Provider:  Loreta AveMurphy, Daniel F, MD  Encounter Date: 05/31/2014      PT End of Session - 05/31/14 1157    Visit Number 5   Number of Visits 16   Date for PT Re-Evaluation 07/16/14   PT Start Time 1102   PT Stop Time 1158   PT Time Calculation (min) 56 min      Past Medical History  Diagnosis Date  . Allergy   . Hypertension   . Hyperlipidemia   . IBS (irritable bowel syndrome)   . Arthritis   . Full dentures   . Wears glasses     readers  . Asthma     no inhalers  . PONV (postoperative nausea and vomiting)     Past Surgical History  Procedure Laterality Date  . Shoulder surgery      left  . Elbow surgery    . Knee surgery      lt  . Carpal tunnel release      rt  . Varicose vein surgery    . Nasal septum surgery    . Abdominal hysterectomy    . Cholecystectomy    . Appendectomy    . Tonsillectomy and adenoidectomy    . Breast lumpectomy  1985    lt  . Eye surgery      both cataracts  . Wrist arthroscopy Right 09/20/2013    Procedure: RIGHT ARTHROSCOPY WRIST EXCISION PISIFORM;  Surgeon: [name], MD;  Location: [facility];  Service: Orthopedics;  Laterality: Right;  . Shoulder arthroscopy with bicepstenotomy Right 05/09/2014    Procedure: SHOULDER ARTHROSCOPY WITH BICEPSTENOTOMY;  Surgeon: [name], MD;  Location: [facility];  Service: Orthopedics;  Laterality: Right;  Debridement Rotator Cuff Tear  . Shoulder acromioplasty Right 05/09/2014    Procedure: SHOULDER ACROMIOPLASTY;  Surgeon: [name], MD;  Location: [facility];  Service: Orthopedics;  Laterality: Right;    There were no vitals taken for this visit.  Visit  Diagnosis:  Pain in joint, shoulder region, right  Limitation of joint motion of right shoulder      Subjective Assessment - 05/31/14 1103    Symptoms shoulder is feeling "terrible". it has been really hurting after therapy in the shoulder and going up into the neck.  "so tender I can't even touch it"   Currently in Pain? Yes   Pain Score 8    Pain Location Shoulder   Pain Orientation Right   Aggravating Factors  movement   Pain Relieving Factors ice, rest, meds                    [facility] Adult PT Treatment/Exercise - 05/31/14 0001    Shoulder Exercises: Seated   Retraction 10 reps  B   Other Seated Exercises shoulder circles x 15   Shoulder Exercises: ROM/Strengthening   Other ROM/Strengthening Exercises cane exercise: standing flexion, abd x 20 each R   Shoulder Exercises: Isometric Strengthening   Extension --   External Rotation 5X5"  x2; R   Internal Rotation 5X5"  x2;  R   Modalities   Modalities --  vasopneumatic compression R shoulder x 15min   Manual Therapy  Joint Mobilization R shoulder grade 2-3 with gentle distraction.  PROM all planes to tolerance                     PT Long Term Goals - 05/29/14 0925    PT LONG TERM GOAL #1   Title independent with advanced HEP by 07/16/14   Status On-going   PT LONG TERM GOAL #2   Title R shoulder AROM to [facility] for all ADLs by 07/16/14   Status On-going   PT LONG TERM GOAL #3   Title R Shoulder MMT 4/5 or greater to assist with improved ADL status by 07/16/14   Status On-going   PT LONG TERM GOAL #4   Title return to pool for recreation/exercise with limitations as necessary by 07/16/14   Status On-going   PT LONG TERM GOAL #5   Title pt demonstate/verbalize info on physical activity to reduce risk of re-injury by 07/16/14   Status On-going               Plan - 05/31/14 1157    Clinical Impression Statement lower tolerance to treatment today due to higher pain levels and increased pain with  more aggressive therapeutic exericse.  +2 ttp R UT and anterior shoulder        Problem List Patient Active Problem List   Diagnosis Date Noted  . Chest pain 05/09/2013  . Hypertension 02/23/2012  . Hyperlipidemia 02/23/2012  . Right shoulder pain 01/25/2012  . Left wrist pain 01/16/2012  . Neck pain 12/26/2011    [name] , DPT, CMP  05/31/2014, 12:03 PM  [facility] [address]  [address] [place], [place], [address] Phone: [phone]   Fax:  [phone]

## 2014-06-03 ENCOUNTER — Encounter: Payer: Self-pay | Admitting: Rehabilitation

## 2014-06-03 ENCOUNTER — Ambulatory Visit: Payer: Medicare Other | Admitting: Rehabilitation

## 2014-06-03 DIAGNOSIS — M25511 Pain in right shoulder: Secondary | ICD-10-CM | POA: Diagnosis not present

## 2014-06-03 DIAGNOSIS — M25611 Stiffness of right shoulder, not elsewhere classified: Secondary | ICD-10-CM

## 2014-06-03 NOTE — Therapy (Signed)
[facility] [address]  [address] [place], [place], [address] Phone: [phone]   Fax:  [phone]  Physical Therapy Treatment  Patient Details  Name: [name] [name] MRN: [id number] Date of Birth: 03/25/1946 Referring Provider:  Loreta Ave, MD  Encounter Date: 06/03/2014      PT End of Session - 06/03/14 1008    Visit Number 6   Number of Visits 17   Date for PT Re-Evaluation 07/16/14   PT Start Time 0930   PT Stop Time 1025   PT Time Calculation (min) 55 min   Activity Tolerance Patient tolerated treatment well      Past Medical History  Diagnosis Date  . Allergy   . Hypertension   . Hyperlipidemia   . IBS (irritable bowel syndrome)   . Arthritis   . Full dentures   . Wears glasses     readers  . Asthma     no inhalers  . PONV (postoperative nausea and vomiting)     Past Surgical History  Procedure Laterality Date  . Shoulder surgery      left  . Elbow surgery    . Knee surgery      lt  . Carpal tunnel release      rt  . Varicose vein surgery    . Nasal septum surgery    . Abdominal hysterectomy    . Cholecystectomy    . Appendectomy    . Tonsillectomy and adenoidectomy    . Breast lumpectomy  1985    lt  . Eye surgery      both cataracts  . Wrist arthroscopy Right 09/20/2013    Procedure: RIGHT ARTHROSCOPY WRIST EXCISION PISIFORM;  Surgeon: Wyn Forster, MD;  Location: [facility];  Service: Orthopedics;  Laterality: Right;  . Shoulder arthroscopy with bicepstenotomy Right 05/09/2014    Procedure: SHOULDER ARTHROSCOPY WITH BICEPSTENOTOMY;  Surgeon: Loreta Ave, MD;  Location: [facility];  Service: Orthopedics;  Laterality: Right;  Debridement Rotator Cuff Tear  . Shoulder acromioplasty Right 05/09/2014    Procedure: SHOULDER ACROMIOPLASTY;  Surgeon: Loreta Ave, MD;  Location: [facility];  Service: Orthopedics;  Laterality: Right;     There were no vitals taken for this visit.  Visit Diagnosis:  Pain in joint, shoulder region, right  Limitation of joint motion of right shoulder      Subjective Assessment - 06/03/14 0928    Symptoms It is feeling better today.  Less pain.  Did notice some redness and warmth in the shoulder after the last treatment.  Lasting until the next day.    Currently in Pain? Yes   Pain Score 4    Pain Location Shoulder   Pain Orientation Right   Aggravating Factors  movement   Pain Relieving Factors ice, rest, meds                    [facility] Adult PT Treatment/Exercise - 06/03/14 0001    Shoulder Exercises: Seated   Internal Rotation 10 reps  2 sets   Theraband Level (Shoulder Internal Rotation) Level 1 (Yellow)   Other Seated Exercises shoulder circles x 15   Shoulder Exercises: Prone   Retraction Strengthening;10 reps  2 sets   Theraband Level (Shoulder Retraction) Level 1 (Yellow)   Shoulder Exercises: Pulleys   Flexion 2 minutes   ABduction 2 minutes   Shoulder Exercises: ROM/Strengthening   Other  ROM/Strengthening Exercises cane exercise: standing flexion, IR, abd x 20 each R   Shoulder Exercises: Isometric Strengthening   External Rotation 5X5"   Modalities   Modalities --  vasopneumatic compression R shoulder x 15min   Manual Therapy   Joint Mobilization R Shoulder A/P Grade 2-3 with gentle distraction and PROM all planes to tolerance.      verbal cues throughout to decrease R upper trapezius tension.               PT Long Term Goals - 05/29/14 0925    PT LONG TERM GOAL #1   Title independent with advanced HEP by 07/16/14   Status On-going   PT LONG TERM GOAL #2   Title [facility] by 07/16/14   Status On-going   PT LONG TERM GOAL #3   Title R Shoulder MMT 4/5 or greater to assist with improved ADL status by 07/16/14   Status On-going   PT LONG TERM GOAL #4   Title return to pool for recreation/exercise with  limitations as necessary by 07/16/14   Status On-going   PT LONG TERM GOAL #5   Title pt demonstate/verbalize info on physical activity to reduce risk of re-injury by 07/16/14   Status On-going               Plan - 06/03/14 1011    Clinical Impression Statement improved tolerance to treatment today with less pain and guarding   PT Next Visit Plan progress as able        Problem List Patient Active Problem List   Diagnosis Date Noted  . Chest pain 05/09/2013  . Hypertension 02/23/2012  . Hyperlipidemia 02/23/2012  . Right shoulder pain 01/25/2012  . Left wrist pain 01/16/2012  . Neck pain 12/26/2011    Idamae Lusherevis, Jaire Pinkham R 06/03/2014, 10:13 AM  [facility] [address]  [address] [place], [place], [address] Phone: [phone]   Fax:  [phone]

## 2014-06-05 ENCOUNTER — Encounter: Payer: Self-pay | Admitting: Rehabilitation

## 2014-06-05 ENCOUNTER — Ambulatory Visit: Payer: Medicare Other | Admitting: Rehabilitation

## 2014-06-05 DIAGNOSIS — M25511 Pain in right shoulder: Secondary | ICD-10-CM | POA: Diagnosis not present

## 2014-06-05 DIAGNOSIS — M25611 Stiffness of right shoulder, not elsewhere classified: Secondary | ICD-10-CM

## 2014-06-05 NOTE — Therapy (Signed)
[facility] [address]  [address] [place], [place], [address] Phone: [phone]   Fax:  [phone]  Physical Therapy Treatment  Patient Details  Name: [name] [name] MRN: [id number] Date of Birth: [birth year] Referring Provider:  Loreta AveMurphy, Daniel F, MD  Encounter Date: 06/05/2014      PT End of Session - 06/05/14 1049    Visit Number 7   Number of Visits 17   Date for PT Re-Evaluation 07/16/14   PT Start Time 1015   PT Stop Time 1100   PT Time Calculation (min) 45 min   Activity Tolerance Patient tolerated treatment well      Past Medical History  Diagnosis Date  . Allergy   . Hypertension   . Hyperlipidemia   . IBS (irritable bowel syndrome)   . Arthritis   . Full dentures   . Wears glasses     readers  . Asthma     no inhalers  . PONV (postoperative nausea and vomiting)     Past Surgical History  Procedure Laterality Date  . Shoulder surgery      left  . Elbow surgery    . Knee surgery      lt  . Carpal tunnel release      rt  . Varicose vein surgery    . Nasal septum surgery    . Abdominal hysterectomy    . Cholecystectomy    . Appendectomy    . Tonsillectomy and adenoidectomy    . Breast lumpectomy  1985    lt  . Eye surgery      both cataracts  . Wrist arthroscopy Right 09/20/2013    Procedure: RIGHT ARTHROSCOPY WRIST EXCISION PISIFORM;  Surgeon: [name], MD;  Location: [facility];  Service: Orthopedics;  Laterality: Right;  . Shoulder arthroscopy with bicepstenotomy Right 05/09/2014    Procedure: SHOULDER ARTHROSCOPY WITH BICEPSTENOTOMY;  Surgeon: [name], MD;  Location: [facility];  Service: Orthopedics;  Laterality: Right;  Debridement Rotator Cuff Tear  . Shoulder acromioplasty Right 05/09/2014    Procedure: SHOULDER ACROMIOPLASTY;  Surgeon: [name], MD;  Location: [facility];  Service: Orthopedics;  Laterality: Right;     There were no vitals taken for this visit.  Visit Diagnosis:  Pain in joint, shoulder region, right  Limitation of joint motion of right shoulder      Subjective Assessment - 06/05/14 1009    Symptoms " not too terrible".  Not as painful after last visit   Currently in Pain? Yes   Pain Score 5    Pain Location Shoulder   Pain Orientation Right   Aggravating Factors  movement   Pain Relieving Factors ice, rest, meds                    [facility] Adult PT Treatment/Exercise - 06/05/14 0001    Shoulder Exercises: Supine   Other Supine Exercises punch R x 10   Shoulder Exercises: Pulleys   Flexion 2 minutes   ABduction 2 minutes   Shoulder Exercises: ROM/Strengthening   Other ROM/Strengthening Exercises cane exercise: standing flexion, IR, abd x 20 each R   Other ROM/Strengthening Exercises wall ladder flex and abd x 6 each to tolerance   Shoulder Exercises: Isometric Strengthening   External Rotation 5X5"  manually   Internal Rotation 5X5"  manually   ABduction 5X5"  at wall   Modalities  Modalities --  vasopneumatic compression R shoulder x   Manual Therapy   Joint Mobilization R Shoulder A/P Grade 2-3 with gentle distraction and PROM all planes to tolerance.                     PT Long Term Goals - 05/29/14 0925    PT LONG TERM GOAL #1   Title independent with advanced HEP by 07/16/14   Status On-going   PT LONG TERM GOAL #2   Title R shoulder AROM to [facility] for all ADLs by 07/16/14   Status On-going   PT LONG TERM GOAL #3   Title R Shoulder MMT 4/5 or greater to assist with improved ADL status by 07/16/14   Status On-going   PT LONG TERM GOAL #4   Title return to pool for recreation/exercise with limitations as necessary by 07/16/14   Status On-going   PT LONG TERM GOAL #5   Title pt demonstate/verbalize info on physical activity to reduce risk of re-injury by 07/16/14   Status On-going               Plan - 06/05/14 1050     Clinical Impression Statement still continues with anterior shoulder pain during PROM and exercise   PT Next Visit Plan progress as able        Problem List Patient Active Problem List   Diagnosis Date Noted  . Chest pain 05/09/2013  . Hypertension 02/23/2012  . Hyperlipidemia 02/23/2012  . Right shoulder pain 01/25/2012  . Left wrist pain 01/16/2012  . Neck pain 12/26/2011    [name], DPT, CMP 06/05/2014, 10:52 AM  [facility] [address]  [address] [place], [place], [address] Phone: [phone]   Fax:  [phone]

## 2014-06-06 ENCOUNTER — Encounter: Payer: Self-pay | Admitting: Rehabilitation

## 2014-06-06 ENCOUNTER — Ambulatory Visit: Payer: Medicare Other | Admitting: Rehabilitation

## 2014-06-06 DIAGNOSIS — M25511 Pain in right shoulder: Secondary | ICD-10-CM

## 2014-06-06 DIAGNOSIS — M25611 Stiffness of right shoulder, not elsewhere classified: Secondary | ICD-10-CM

## 2014-06-06 NOTE — Therapy (Signed)
[facility] [address]  [address] [place], [place], [address] Phone: [phone]   Fax:  [phone]  Physical Therapy Treatment  Patient Details  Name: [name] [name] MRN: [id number] Date of Birth: Jun 22, 1945 Referring Provider:  Loreta Ave, MD  Encounter Date: 06/06/2014      PT End of Session - 06/06/14 1013    Visit Number 8   Number of Visits 17   Date for PT Re-Evaluation 07/16/14   PT Start Time 0930   PT Stop Time 1030   PT Time Calculation (min) 60 min      Past Medical History  Diagnosis Date  . Allergy   . Hypertension   . Hyperlipidemia   . IBS (irritable bowel syndrome)   . Arthritis   . Full dentures   . Wears glasses     readers  . Asthma     no inhalers  . PONV (postoperative nausea and vomiting)     Past Surgical History  Procedure Laterality Date  . Shoulder surgery      left  . Elbow surgery    . Knee surgery      lt  . Carpal tunnel release      rt  . Varicose vein surgery    . Nasal septum surgery    . Abdominal hysterectomy    . Cholecystectomy    . Appendectomy    . Tonsillectomy and adenoidectomy    . Breast lumpectomy  1985    lt  . Eye surgery      both cataracts  . Wrist arthroscopy Right 09/20/2013    Procedure: RIGHT ARTHROSCOPY WRIST EXCISION PISIFORM;  Surgeon: Wyn Forster, MD;  Location: [facility];  Service: Orthopedics;  Laterality: Right;  . Shoulder arthroscopy with bicepstenotomy Right 05/09/2014    Procedure: SHOULDER ARTHROSCOPY WITH BICEPSTENOTOMY;  Surgeon: Loreta Ave, MD;  Location: [facility];  Service: Orthopedics;  Laterality: Right;  Debridement Rotator Cuff Tear  . Shoulder acromioplasty Right 05/09/2014    Procedure: SHOULDER ACROMIOPLASTY;  Surgeon: Loreta Ave, MD;  Location: [facility];  Service: Orthopedics;  Laterality: Right;    There were no vitals taken for this visit.  Visit  Diagnosis:  Pain in joint, shoulder region, right  Limitation of joint motion of right shoulder      Subjective Assessment - 06/06/14 0927    Symptoms Is starting to have bad pain in the R bicep and tricep since late afternoon. Getting twinges of grabbing pain and soreness   Currently in Pain? Yes   Pain Score 6    Pain Location --  bicep and anterior shoulder   Aggravating Factors  movement   Pain Relieving Factors ice, rest, meds          [facility] PT Assessment - 06/06/14 0933    ROM / Strength   AROM / PROM / Strength AROM;PROM   AROM   AROM Assessment Site Shoulder   Right/Left Shoulder Right   Right Shoulder Flexion 70 Degrees   Right Shoulder ABduction 65 Degrees  pain   Right Shoulder Internal Rotation --  to glute with pain   Right Shoulder External Rotation --  60% with pain   PROM   Right Shoulder Flexion 125 Degrees  pain   Right Shoulder ABduction 110 Degrees  pain   Right Shoulder Internal Rotation 45 Degrees  pain   Right Shoulder External Rotation 45  Degrees  pain                  [facility] Adult PT Treatment/Exercise - 06/06/14 1010    Shoulder Exercises: Supine   Other Supine Exercises AROM IR/ER x 20   Modalities   Modalities --  vaspneumatic compression R shoulder x 15min   Manual Therapy   Joint Mobilization R shoulder PROM to tolerance with ER/IR at 45degrees.  GH AP and PA joint mobilizations grade II-III 3x20".  STM biceps belly and anterior shoulder.  Manual isometrics at 45 deg of abduction supine: 10"x5 each IR/ER alternating.  RS at 90deg of flexion 3x10" alternating directions                     PT Long Term Goals - 05/29/14 0925    PT LONG TERM GOAL #1   Title independent with advanced HEP by 07/16/14   Status On-going   PT LONG TERM GOAL #2   Title [facility] by 07/16/14   Status On-going   PT LONG TERM GOAL #3   Title R Shoulder MMT 4/5 or greater to assist with improved ADL status by  07/16/14   Status On-going   PT LONG TERM GOAL #4   Title return to pool for recreation/exercise with limitations as necessary by 07/16/14   Status On-going   PT LONG TERM GOAL #5   Title pt demonstate/verbalize info on physical activity to reduce risk of re-injury by 07/16/14   Status On-going               Plan - 06/06/14 1014    Clinical Impression Statement Increased anterior and bicep region R shoulder pain today for no known reason.  +2 ttp bicep belly and anterior shoulder with knot in bicep belly.  pain decreased after STM per patient. Decreased active TE today due to pain level with focus on ROM   PT Next Visit Plan progress as able        Problem List Patient Active Problem List   Diagnosis Date Noted  . Chest pain 05/09/2013  . Hypertension 02/23/2012  . Hyperlipidemia 02/23/2012  . Right shoulder pain 01/25/2012  . Left wrist pain 01/16/2012  . Neck pain 12/26/2011    Idamae Lusherevis, Fanta Wimberley R, DPT, CMP 06/06/2014, 10:16 AM  [facility] [address]  [address] [place], [place], [address] Phone: [phone]   Fax:  [phone]

## 2014-06-10 ENCOUNTER — Ambulatory Visit: Payer: Medicare Other | Admitting: Rehabilitation

## 2014-06-10 ENCOUNTER — Encounter: Payer: Self-pay | Admitting: Rehabilitation

## 2014-06-10 DIAGNOSIS — M25511 Pain in right shoulder: Secondary | ICD-10-CM | POA: Diagnosis not present

## 2014-06-10 DIAGNOSIS — M25611 Stiffness of right shoulder, not elsewhere classified: Secondary | ICD-10-CM

## 2014-06-10 NOTE — Therapy (Signed)
[facility] [address]  [address] [place], [place], [address] Phone: [phone]   Fax:  [phone]  Physical Therapy Treatment  Patient Details  Name: [name] HampshireLynn C [name] MRN: [id number] Date of Birth: Oct 23, 1945 Referring Provider:  Loreta AveMurphy, Daniel F, MD  Encounter Date: 06/10/2014      PT End of Session - 06/10/14 1053    Visit Number 9   Number of Visits 17   Date for PT Re-Evaluation 07/16/14   PT Start Time 1015   PT Stop Time 1110   PT Time Calculation (min) 55 min      Past Medical History  Diagnosis Date  . Allergy   . Hypertension   . Hyperlipidemia   . IBS (irritable bowel syndrome)   . Arthritis   . Full dentures   . Wears glasses     readers  . Asthma     no inhalers  . PONV (postoperative nausea and vomiting)     Past Surgical History  Procedure Laterality Date  . Shoulder surgery      left  . Elbow surgery    . Knee surgery      lt  . Carpal tunnel release      rt  . Varicose vein surgery    . Nasal septum surgery    . Abdominal hysterectomy    . Cholecystectomy    . Appendectomy    . Tonsillectomy and adenoidectomy    . Breast lumpectomy  1985    lt  . Eye surgery      both cataracts  . Wrist arthroscopy Right 09/20/2013    Procedure: RIGHT ARTHROSCOPY WRIST EXCISION PISIFORM;  Surgeon: [name], MD;  Location: [facility];  Service: Orthopedics;  Laterality: Right;  . Shoulder arthroscopy with bicepstenotomy Right 05/09/2014    Procedure: SHOULDER ARTHROSCOPY WITH BICEPSTENOTOMY;  Surgeon: [name], MD;  Location: [facility];  Service: Orthopedics;  Laterality: Right;  Debridement Rotator Cuff Tear  . Shoulder acromioplasty Right 05/09/2014    Procedure: SHOULDER ACROMIOPLASTY;  Surgeon: [name], MD;  Location: [facility];  Service: Orthopedics;  Laterality: Right;    There were no vitals taken for this visit.  Visit  Diagnosis:  Pain in joint, shoulder region, right  Limitation of joint motion of right shoulder      Subjective Assessment - 06/10/14 1013    Symptoms Did have a little bit of bicep region pain but not as bad.  "can feel the "knot".  Also pain anterior shoulder.  Not too bad this weekend   Currently in Pain? Yes   Pain Score 5    Pain Location Shoulder   Pain Orientation Right   Aggravating Factors  movement   Pain Relieving Factors rest, ice, meds                    [facility] Adult PT Treatment/Exercise - 06/10/14 1015    Shoulder Exercises: Supine   Other Supine Exercises 90 flexion c/cc 2x10 each   Shoulder Exercises: Sidelying   External Rotation AROM;10 reps  2sets   ABduction AAROM;5 reps  2 sets   Shoulder Exercises: Standing   Retraction Strengthening;20 reps   Theraband Level (Shoulder Retraction) Level 2 (Red)   Other Standing Exercises tricep extension RTB 2x10   Shoulder Exercises: Pulleys   Flexion 2 minutes   Shoulder Exercises: Therapy Ball   Flexion 10 reps  rolling orange ball up wall   Modalities   Modalities --  vasopneumatic compression R shoulder x   Manual Therapy   Joint Mobilization R shoulder PROM to tolerance with ER/IR at 45degrees.  GH AP and PA joint mobilizations grade II-III 3x20".  STM biceps belly and anterior shoulder.  Manual isometrics at 45 deg of abduction supine: 10"x5 each IR/ER alternating.  RS at 90deg of flexion 3x10" alternating directions                     PT Long Term Goals - 05/29/14 0925    PT LONG TERM GOAL #1   Title independent with advanced HEP by 07/16/14   Status On-going   PT LONG TERM GOAL #2   Title R shoulder AROM to [facility] for all ADLs by 07/16/14   Status On-going   PT LONG TERM GOAL #3   Title R Shoulder MMT 4/5 or greater to assist with improved ADL status by 07/16/14   Status On-going   PT LONG TERM GOAL #4   Title return to pool for recreation/exercise with limitations as necessary  by 07/16/14   Status On-going   PT LONG TERM GOAL #5   Title pt demonstate/verbalize info on physical activity to reduce risk of re-injury by 07/16/14   Status On-going               Plan - 06/10/14 1054    Clinical Impression Statement Continues with lateral/anterior shoulder pain with abduction AROM, but tolerated increased activity today.    PT Next Visit Plan progress as able; MD 3/11        Problem List Patient Active Problem List   Diagnosis Date Noted  . Chest pain 05/09/2013  . Hypertension 02/23/2012  . Hyperlipidemia 02/23/2012  . Right shoulder pain 01/25/2012  . Left wrist pain 01/16/2012  . Neck pain 12/26/2011    [name] [name], DPT, CMP 06/10/2014, 10:55 AM  [facility] [address]  [address] [place], [place], [address] Phone: [phone]   Fax:  [phone]

## 2014-06-13 ENCOUNTER — Encounter: Payer: Self-pay | Admitting: Rehabilitation

## 2014-06-13 ENCOUNTER — Ambulatory Visit: Payer: Medicare Other | Attending: Orthopedic Surgery | Admitting: Rehabilitation

## 2014-06-13 DIAGNOSIS — M25611 Stiffness of right shoulder, not elsewhere classified: Secondary | ICD-10-CM

## 2014-06-13 DIAGNOSIS — Z9889 Other specified postprocedural states: Secondary | ICD-10-CM | POA: Insufficient documentation

## 2014-06-13 DIAGNOSIS — M25511 Pain in right shoulder: Secondary | ICD-10-CM | POA: Diagnosis not present

## 2014-06-13 NOTE — Therapy (Signed)
[facility] [address]  [address] [place], [place], [address] Phone: [phone]   Fax:  [phone]  Physical Therapy Treatment  Patient Details  Name: [name] [name] MRN: [id number] Date of Birth: [birth year] Referring Provider:  Loreta Ave, MD  Encounter Date: 06/13/2014      PT End of Session - 06/13/14 1056    Visit Number 10   Number of Visits 17   Date for PT Re-Evaluation 07/16/14   PT Start Time 1015   PT Stop Time 1110   PT Time Calculation (min) 55 min   Activity Tolerance Patient tolerated treatment well      Past Medical History  Diagnosis Date  . Allergy   . Hypertension   . Hyperlipidemia   . IBS (irritable bowel syndrome)   . Arthritis   . Full dentures   . Wears glasses     readers  . Asthma     no inhalers  . PONV (postoperative nausea and vomiting)     Past Surgical History  Procedure Laterality Date  . Shoulder surgery      left  . Elbow surgery    . Knee surgery      lt  . Carpal tunnel release      rt  . Varicose vein surgery    . Nasal septum surgery    . Abdominal hysterectomy    . Cholecystectomy    . Appendectomy    . Tonsillectomy and adenoidectomy    . Breast lumpectomy  1985    lt  . Eye surgery      both cataracts  . Wrist arthroscopy Right 09/20/2013    Procedure: RIGHT ARTHROSCOPY WRIST EXCISION PISIFORM;  Surgeon: Wyn Forster, MD;  Location: [facility];  Service: Orthopedics;  Laterality: Right;  . Shoulder arthroscopy with bicepstenotomy Right 05/09/2014    Procedure: SHOULDER ARTHROSCOPY WITH BICEPSTENOTOMY;  Surgeon: Loreta Ave, MD;  Location: [facility];  Service: Orthopedics;  Laterality: Right;  Debridement Rotator Cuff Tear  . Shoulder acromioplasty Right 05/09/2014    Procedure: SHOULDER ACROMIOPLASTY;  Surgeon: Loreta Ave, MD;  Location: [facility];  Service: Orthopedics;  Laterality: Right;     There were no vitals taken for this visit.  Visit Diagnosis:  Pain in joint, shoulder region, right  Limitation of joint motion of right shoulder      Subjective Assessment - 06/13/14 1018    Symptoms Better than it has been, but worse than I would like it to be   Currently in Pain? Yes   Pain Score 5    Pain Location Shoulder   Pain Orientation Right   Aggravating Factors  movement, putting on coat   Pain Relieving Factors rest, ice, meds          [facility] PT Assessment - 06/13/14 0001    Observation/Other Assessments   Focus on Therapeutic Outcomes (FOTO)  61% limited   AROM   Right Shoulder Flexion 120 Degrees  pain   Right Shoulder ABduction 110 Degrees  pain   Right Shoulder Internal Rotation --  to L5 with pain   Right Shoulder External Rotation --  80% with pain   Strength   Strength Assessment Site Shoulder   Right/Left Shoulder Right   Right Shoulder Flexion 4-/5  due to pain   Right Shoulder Extension 4/5   Right Shoulder ABduction 3+/5  due to pain   Right  Shoulder Internal Rotation 4/5  pain   Right Shoulder External Rotation 4/5                  [facility] Adult PT Treatment/Exercise - 06/13/14 0001    Shoulder Exercises: Supine   Other Supine Exercises --   Shoulder Exercises: Sidelying   External Rotation --  2sets   ABduction --  2 sets   Shoulder Exercises: Standing   Flexion AAROM;10 reps;Right   ABduction AAROM;10 reps;Right   Retraction Strengthening;20 reps   Theraband Level (Shoulder Retraction) Level 2 (Red)   Other Standing Exercises tricep extension RTB 2x10   Shoulder Exercises: ROM/Strengthening   UBE (Upper Arm Bike) level 0; 2'/2'   Shoulder Exercises: Stretch   Internal Rotation Stretch --  5"x10 R with belt   Modalities   Modalities --  vasopneumatic compression x 15min R shoulder; low   Manual Therapy   Manual Therapy Passive ROM   Passive ROM R shoulder to tolerance                     PT  Long Term Goals - 05/29/14 0925    PT LONG TERM GOAL #1   Title independent with advanced HEP by 07/16/14   Status On-going   PT LONG TERM GOAL #2   Title R shoulder AROM to [facility] for all ADLs by 07/16/14   Status On-going   PT LONG TERM GOAL #3   Title R Shoulder MMT 4/5 or greater to assist with improved ADL status by 07/16/14   Status On-going   PT LONG TERM GOAL #4   Title return to pool for recreation/exercise with limitations as necessary by 07/16/14   Status On-going   PT LONG TERM GOAL #5   Title pt demonstate/verbalize info on physical activity to reduce risk of re-injury by 07/16/14   Status On-going               Plan - 06/13/14 1057    Clinical Impression Statement Improving AROM and strength overall. Continues with sharp pain with IR PROM today and all active motions over 90deg   PT Next Visit Plan MD 3/11          G-Codes - 06/13/14 1058    Functional Assessment Tool Used FOTO   Functional Limitation Mobility: Walking and moving around   Mobility: Walking and Moving Around Current Status [phone](G8978) At least 60 percent but less than 80 percent impaired, limited or restricted   Mobility: Walking and Moving Around Goal Status [phone](G8979) At least 40 percent but less than 60 percent impaired, limited or restricted      Problem List Patient Active Problem List   Diagnosis Date Noted  . Chest pain 05/09/2013  . Hypertension 02/23/2012  . Hyperlipidemia 02/23/2012  . Right shoulder pain 01/25/2012  . Left wrist pain 01/16/2012  . Neck pain 12/26/2011    Idamae Lusherevis, Kara R, DPT, CMP 06/13/2014, 11:00 AM  [facility] [address]  [address] [place], [place], [address] Phone: [phone]   Fax:  [phone]

## 2014-06-17 ENCOUNTER — Ambulatory Visit: Payer: Medicare Other | Admitting: Rehabilitation

## 2014-06-17 ENCOUNTER — Encounter: Payer: Self-pay | Admitting: Rehabilitation

## 2014-06-17 DIAGNOSIS — M25511 Pain in right shoulder: Secondary | ICD-10-CM | POA: Diagnosis not present

## 2014-06-17 DIAGNOSIS — M25611 Stiffness of right shoulder, not elsewhere classified: Secondary | ICD-10-CM

## 2014-06-17 NOTE — Therapy (Signed)
[facility] [address]  [address] [place], [place], [address] Phone: [phone]   Fax:  [phone]  Physical Therapy Treatment  Patient Details  Name: [name] [name] MRN: [id number] Date of Birth: 14-Dec-1945 Referring Provider:  Mckinley JewelMurphy, Daniel, MD  Encounter Date: 06/17/2014      PT End of Session - 06/17/14 1012    Visit Number 11   Number of Visits 17   Date for PT Re-Evaluation 07/16/14   PT Start Time 0930   PT Stop Time 1030   PT Time Calculation (min) 60 min   Activity Tolerance Patient tolerated treatment well      Past Medical History  Diagnosis Date  . Allergy   . Hypertension   . Hyperlipidemia   . IBS (irritable bowel syndrome)   . Arthritis   . Full dentures   . Wears glasses     readers  . Asthma     no inhalers  . PONV (postoperative nausea and vomiting)     Past Surgical History  Procedure Laterality Date  . Shoulder surgery      left  . Elbow surgery    . Knee surgery      lt  . Carpal tunnel release      rt  . Varicose vein surgery    . Nasal septum surgery    . Abdominal hysterectomy    . Cholecystectomy    . Appendectomy    . Tonsillectomy and adenoidectomy    . Breast lumpectomy  1985    lt  . Eye surgery      both cataracts  . Wrist arthroscopy Right 09/20/2013    Procedure: RIGHT ARTHROSCOPY WRIST EXCISION PISIFORM;  Surgeon: [name], MD;  Location: [facility];  Service: Orthopedics;  Laterality: Right;  . Shoulder arthroscopy with bicepstenotomy Right 05/09/2014    Procedure: SHOULDER ARTHROSCOPY WITH BICEPSTENOTOMY;  Surgeon: [name], MD;  Location: [facility];  Service: Orthopedics;  Laterality: Right;  Debridement Rotator Cuff Tear  . Shoulder acromioplasty Right 05/09/2014    Procedure: SHOULDER ACROMIOPLASTY;  Surgeon: [name], MD;  Location: [facility];  Service: Orthopedics;  Laterality: Right;     There were no vitals taken for this visit.  Visit Diagnosis:  Pain in joint, shoulder region, right  Limitation of joint motion of right shoulder      Subjective Assessment - 06/17/14 0929    Symptoms no sig c/o.  Improved overhead flexion ROM reported but continues with increased pain with extension and rotation motions.  MD 3/11   Currently in Pain? Yes   Pain Score 5    Pain Location Shoulder   Pain Orientation Right   Aggravating Factors  movement   Pain Relieving Factors rest, ice, meds                    [facility] Adult PT Treatment/Exercise - 06/17/14 0001    Shoulder Exercises: Prone   Extension AROM;20 reps   Horizontal ABduction 1 AROM;20 reps   Shoulder Exercises: Sidelying   External Rotation Strengthening;Right;10 reps  2 sets   External Rotation Weight (lbs) 2   External Rotation Limitations about 80% ROM available   ABduction AROM;Right;5 reps  3 sets   Shoulder Exercises: Standing   Internal Rotation Strengthening;Right;20 reps   Theraband Level (Shoulder Internal Rotation) Level 2 (Red)   Flexion AAROM;10 reps;Right   ABduction AAROM;10 reps;Right  Shoulder Exercises: ROM/Strengthening   UBE (Upper Arm Bike) level 2. 2'/2'   Modalities   Modalities --  vasopneumatic compression   Manual Therapy   Passive ROM R shoulder to tolerance                     PT Long Term Goals - 05/29/14 0925    PT LONG TERM GOAL #1   Title independent with advanced HEP by 07/16/14   Status On-going   PT LONG TERM GOAL #2   Title R shoulder AROM to [facility] for all ADLs by 07/16/14   Status On-going   PT LONG TERM GOAL #3   Title R Shoulder MMT 4/5 or greater to assist with improved ADL status by 07/16/14   Status On-going   PT LONG TERM GOAL #4   Title return to pool for recreation/exercise with limitations as necessary by 07/16/14   Status On-going   PT LONG TERM GOAL #5   Title pt demonstate/verbalize info on physical activity to reduce risk of  re-injury by 07/16/14   Status On-going               Plan - 06/17/14 1012    Clinical Impression Statement improved AROM today and tolerance to addition of weights   PT Next Visit Plan MD note        Problem List Patient Active Problem List   Diagnosis Date Noted  . Chest pain 05/09/2013  . Hypertension 02/23/2012  . Hyperlipidemia 02/23/2012  . Right shoulder pain 01/25/2012  . Left wrist pain 01/16/2012  . Neck pain 12/26/2011    [name], DPT, CMP 06/17/2014, 10:14 AM  [facility] [address]  [address] [place], [place], [address] Phone: [phone]   Fax:  [phone]

## 2014-06-20 ENCOUNTER — Ambulatory Visit: Payer: Medicare Other | Admitting: Rehabilitation

## 2014-06-20 ENCOUNTER — Encounter: Payer: Self-pay | Admitting: Rehabilitation

## 2014-06-20 DIAGNOSIS — M25511 Pain in right shoulder: Secondary | ICD-10-CM

## 2014-06-20 DIAGNOSIS — M25611 Stiffness of right shoulder, not elsewhere classified: Secondary | ICD-10-CM

## 2014-06-20 NOTE — Therapy (Signed)
[facility] [address]  [address] [place], [place], [address] Phone: [phone]   Fax:  [phone]  Physical Therapy Treatment  Patient Details  Name: [name] HampshireLynn C [name] MRN: [id number] Date of Birth: 05-14-1945 Referring Provider:  Mckinley JewelMurphy, Daniel, MD  Encounter Date: 06/20/2014      PT End of Session - 06/20/14 1016    Visit Number 12   Number of Visits 17   Date for PT Re-Evaluation 07/16/14   PT Start Time 0932   PT Stop Time 1027   PT Time Calculation (min) 55 min   Activity Tolerance Patient tolerated treatment well;Patient limited by pain       There were no vitals taken for this visit.  Visit Diagnosis:  Pain in joint, shoulder region, right  Limitation of joint motion of right shoulder      Subjective Assessment - 06/20/14 0935    Symptoms no increased soreness after last visit.    Currently in Pain? Yes   Pain Score 5    Pain Location Shoulder   Pain Orientation Right          [facility] PT Assessment - 06/20/14 0001    AROM   Right Shoulder Flexion 120 Degrees   Right Shoulder ABduction 110 Degrees   Right Shoulder Internal Rotation --  to glute with pain   Right Shoulder External Rotation --  60% with pain   Strength   Right Shoulder Flexion 4-/5   Right Shoulder Extension 4/5   Right Shoulder ABduction 3+/5   Right Shoulder Internal Rotation 4/5   Right Shoulder External Rotation 4/5                  [facility] Adult PT Treatment/Exercise - 06/20/14 0001    Shoulder Exercises: Prone   Extension AROM;20 reps   Horizontal ABduction 1 AROM;20 reps   Shoulder Exercises: Standing   External Rotation 20 reps   Theraband Level (Shoulder External Rotation) Level 1 (Yellow)   External Rotation Limitations with increased pain   Internal Rotation 20 reps   Theraband Level (Shoulder Internal Rotation) Level 1 (Yellow)   Internal Rotation Limitations increased pain   Flexion AAROM;10 reps;Other  (comment)  also yellow band punches 2x10 with some increased pain   ABduction AAROM;10 reps   Extension 20 reps   Theraband Level (Shoulder Extension) Level 1 (Yellow)   Retraction 20 reps   Theraband Level (Shoulder Retraction) Level 2 (Red)   Shoulder Exercises: ROM/Strengthening   UBE (Upper Arm Bike) level 2. 2'/2'   Manual Therapy   Joint Mobilization AP glides grade II to IV- x 30" x 2   Passive ROM R shoulder to tolerance                     PT Long Term Goals - 05/29/14 [id number]    PT LONG TERM GOAL #1   Title independent with advanced HEP by 07/16/14   Status On-going   PT LONG TERM GOAL #2   Title R shoulder AROM to [facility] for all ADLs by 07/16/14   Status On-going   PT LONG TERM GOAL #3   Title R Shoulder MMT 4/5 or greater to assist with improved ADL status by 07/16/14   Status On-going   PT LONG TERM GOAL #4   Title return to pool for recreation/exercise with limitations as necessary by 07/16/14   Status On-going   PT LONG TERM GOAL #5   Title  pt demonstate/verbalize info on physical activity to reduce risk of re-injury by 07/16/14   Status On-going         MD PROGRESS REPORT SECTION Jeannett has attended 12 PT visits with treatment including therapeutic exercise, ROM work, and ice ROM and strength noted above     Plan - 06/20/14 0933    Clinical Impression Statement Pt has improved active and passive ROM but still limited but anterior shoulder pain with long lever abduction and flexion activities, and with active ER.  Still having trouble doing hair, reaching overhead, getting things out of the cupboard.  Continues with a constant 5/10 pain that can get up to 10/10 at night/sleeping incorrectly.  Patient limited during all exercise activities due to pain.    PT Next Visit Plan cont plan        Problem List Patient Active Problem List   Diagnosis Date Noted  . Chest pain 05/09/2013  . Hypertension 02/23/2012  . Hyperlipidemia 02/23/2012  . Right shoulder  pain 01/25/2012  . Left wrist pain 01/16/2012  . Neck pain 12/26/2011    [name] [name], DPT, CMP 06/20/2014, 10:20 AM  [facility] [address]  [address] [place], [place], [address] Phone: [phone]   Fax:  [phone]

## 2014-06-26 ENCOUNTER — Ambulatory Visit: Payer: Medicare Other | Admitting: Rehabilitation

## 2014-06-26 DIAGNOSIS — M25511 Pain in right shoulder: Secondary | ICD-10-CM

## 2014-06-26 DIAGNOSIS — M25611 Stiffness of right shoulder, not elsewhere classified: Secondary | ICD-10-CM

## 2014-06-26 NOTE — Therapy (Signed)
[facility] [address]  [address] [place], [place], [address] Phone: [phone]   Fax:  [phone]  Physical Therapy Treatment  Patient Details  Name: [name] [name] MRN: [id number] Date of Birth: Mar 25, 1946 Referring Provider:  Mckinley JewelMurphy, Daniel, MD  Encounter Date: 06/26/2014      PT End of Session - 06/26/14 1101    Visit Number 13   Number of Visits 17   Date for PT Re-Evaluation 07/16/14   PT Start Time 1100   PT Stop Time 1157   PT Time Calculation (min) 57 min      Past Medical History  Diagnosis Date  . Allergy   . Hypertension   . Hyperlipidemia   . IBS (irritable bowel syndrome)   . Arthritis   . Full dentures   . Wears glasses     readers  . Asthma     no inhalers  . PONV (postoperative nausea and vomiting)     Past Surgical History  Procedure Laterality Date  . Shoulder surgery      left  . Elbow surgery    . Knee surgery      lt  . Carpal tunnel release      rt  . Varicose vein surgery    . Nasal septum surgery    . Abdominal hysterectomy    . Cholecystectomy    . Appendectomy    . Tonsillectomy and adenoidectomy    . Breast lumpectomy  1985    lt  . Eye surgery      both cataracts  . Wrist arthroscopy Right 09/20/2013    Procedure: RIGHT ARTHROSCOPY WRIST EXCISION PISIFORM;  Surgeon: [name], MD;  Location: [facility];  Service: Orthopedics;  Laterality: Right;  . Shoulder arthroscopy with bicepstenotomy Right 05/09/2014    Procedure: SHOULDER ARTHROSCOPY WITH BICEPSTENOTOMY;  Surgeon: [name], MD;  Location: [facility];  Service: Orthopedics;  Laterality: Right;  Debridement Rotator Cuff Tear  . Shoulder acromioplasty Right 05/09/2014    Procedure: SHOULDER ACROMIOPLASTY;  Surgeon: [name], MD;  Location: [facility];  Service: Orthopedics;  Laterality: Right;    There were no vitals filed for this visit.  Visit  Diagnosis:  Pain in joint, shoulder region, right  Limitation of joint motion of right shoulder      Subjective Assessment - 06/26/14 1102    Symptoms Had a cortizon shot at the doctors office when she went and thinks its helped a little bit. MD said there was still some inflammation in there that will cause more scar tissue. Also gave her some new pain meds and wanted her to continue therapy and will check back in 6 weeks.    Currently in Pain? Yes   Pain Score 5    Pain Location Shoulder   Pain Orientation Right            [facility] PT Assessment - 06/26/14 0001    ROM / Strength   AROM / PROM / Strength AROM   AROM   AROM Assessment Site Shoulder   Right/Left Shoulder Right   Right Shoulder Flexion 130 Degrees   Right Shoulder ABduction 90 Degrees                   [facility] Adult PT Treatment/Exercise - 06/26/14 1104    Shoulder Exercises: Prone   Extension AROM;20 reps   External Rotation AROM;10 reps  Internal Rotation AROM;10 reps   Horizontal ABduction 1 AROM;20 reps   Shoulder Exercises: Standing   Flexion AAROM;10 reps  Only slight help during AAROM, mainly AROM   ABduction AAROM;10 reps  Noted a clunch/catch with descending on a few reps   Extension 10 reps   Theraband Level (Shoulder Extension) Level 2 (Red)   Retraction 20 reps   Theraband Level (Shoulder Retraction) Level 2 (Red)   Shoulder Exercises: ROM/Strengthening   UBE (Upper Arm Bike) level 2.0 2'/2'   Modalities   Modalities --  Vasopneumatic compression   Manual Therapy   Manual Therapy Massage;Passive ROM;Joint mobilization   Joint Mobilization AP glides grade II to IV- x 30" x 2   Massage To Rt pec, bicep area   Passive ROM R shoulder to tolerance                     PT Long Term Goals - 05/29/14 0925    PT LONG TERM GOAL #1   Title independent with advanced HEP by 07/16/14   Status On-going   PT LONG TERM GOAL #2   Title R shoulder AROM to [facility] for all ADLs by 07/16/14    Status On-going   PT LONG TERM GOAL #3   Title R Shoulder MMT 4/5 or greater to assist with improved ADL status by 07/16/14   Status On-going   PT LONG TERM GOAL #4   Title return to pool for recreation/exercise with limitations as necessary by 07/16/14   Status On-going   PT LONG TERM GOAL #5   Title pt demonstate/verbalize info on physical activity to reduce risk of re-injury by 07/16/14   Status On-going               Plan - 06/26/14 1155    Clinical Impression Statement Tolerated new exercises well but still very painful with AROM especially abduction which was only 90 today due to pain. Good tolerance to prone exercises but limited motion and pain with IR.    PT Next Visit Plan Continue per plan   Consulted and Agree with Plan of Care Patient        Problem List Patient Active Problem List   Diagnosis Date Noted  . Chest pain 05/09/2013  . Hypertension 02/23/2012  . Hyperlipidemia 02/23/2012  . Right shoulder pain 01/25/2012  . Left wrist pain 01/16/2012  . Neck pain 12/26/2011    Ronney Lion, PTA 06/26/2014, 11:58 AM  [facility] [address]  [address] [place], [place], [address] Phone: [phone]   Fax:  [phone]

## 2014-06-28 ENCOUNTER — Ambulatory Visit: Payer: Medicare Other | Admitting: Rehabilitation

## 2014-07-01 ENCOUNTER — Encounter: Payer: Self-pay | Admitting: Rehabilitation

## 2014-07-01 ENCOUNTER — Ambulatory Visit: Payer: Medicare Other | Admitting: Rehabilitation

## 2014-07-01 DIAGNOSIS — M25611 Stiffness of right shoulder, not elsewhere classified: Secondary | ICD-10-CM

## 2014-07-01 DIAGNOSIS — M25511 Pain in right shoulder: Secondary | ICD-10-CM | POA: Diagnosis not present

## 2014-07-01 NOTE — Progress Notes (Signed)
HPI: FU hypertension. Myoview in 2009 showed an ejection fraction of 64% and normal perfusion. Echocardiogram in March 2015 showed normal LV function, grade 1 diastolic dysfunction and mild mitral regurgitation. Since she was last seen, she had one brief episode of chest pain approximately 1 week ago. Lasted 1-1/2 minutes. Not pleuritic or positional. Otherwise no dyspnea on exertion or exertional chest pain. No syncope.  Current Outpatient Prescriptions  Medication Sig Dispense Refill  . aspirin 81 MG tablet Take 81 mg by mouth daily.    . Biotin 5 MG CAPS Take 1 capsule by mouth daily.    . Cholecalciferol (VITAMIN D3) 2000 UNITS capsule Take 2,000 Units by mouth daily.    Marland Kitchen doxycycline (VIBRAMYCIN) 100 MG capsule Take 100 mg by mouth as needed (rosacia).    . Loperamide HCl (IMODIUM A-D PO) Take by mouth.    . NON FORMULARY [place] and green tea 1 tab oral daily    . OLIVE LEAF EXTRACT PO Take 1 tablet by mouth daily.    Marland Kitchen olmesartan (BENICAR) 40 MG tablet Take 20 mg by mouth daily.    Marland Kitchen PREMARIN 1.25 MG tablet Take 1.25 mg by mouth daily.     Marland Kitchen Resveratrol 250 MG CAPS Take 1 capsule by mouth daily.    . Vitamin Mixture (ESTER-C PO) Take 1 tablet by mouth daily.    . Vitamin Mixture (VITAMIN E COMPLETE) CAPS Take 1 capsule by mouth daily.     No current facility-administered medications for this visit.     Past Medical History  Diagnosis Date  . Allergy   . Hypertension   . Hyperlipidemia   . IBS (irritable bowel syndrome)   . Arthritis   . Full dentures   . Wears glasses     readers  . Asthma     no inhalers  . PONV (postoperative nausea and vomiting)     Past Surgical History  Procedure Laterality Date  . Shoulder surgery      left  . Elbow surgery    . Knee surgery      lt  . Carpal tunnel release      rt  . Varicose vein surgery    . Nasal septum surgery    . Abdominal hysterectomy    . Cholecystectomy    . Appendectomy    . Tonsillectomy  and adenoidectomy    . Breast lumpectomy  1985    lt  . Eye surgery      both cataracts  . Wrist arthroscopy Right 09/20/2013    Procedure: RIGHT ARTHROSCOPY WRIST EXCISION PISIFORM;  Surgeon: Wyn Forster, MD;  Location: [facility];  Service: Orthopedics;  Laterality: Right;  . Shoulder arthroscopy with bicepstenotomy Right 05/09/2014    Procedure: SHOULDER ARTHROSCOPY WITH BICEPSTENOTOMY;  Surgeon: Loreta Ave, MD;  Location: [facility];  Service: Orthopedics;  Laterality: Right;  Debridement Rotator Cuff Tear  . Shoulder acromioplasty Right 05/09/2014    Procedure: SHOULDER ACROMIOPLASTY;  Surgeon: Loreta Ave, MD;  Location: [facility];  Service: Orthopedics;  Laterality: Right;    History   Social History  . Marital Status: Married    Spouse Name: N/A  . Number of Children: 3  . Years of Education: N/A   Occupational History  .      Safety consultant   Social History Main Topics  . Smoking status: Never Smoker   . Smokeless tobacco: Not on  file  . Alcohol Use: Yes     Comment: Occasional glass of wine  . Drug Use: No  . Sexual Activity: Not on file   Other Topics Concern  . Not on file   Social History Narrative    ROS: residual shoulder soreness from recent surgery but no fevers or chills, productive cough, hemoptysis, dysphasia, odynophagia, melena, hematochezia, dysuria, hematuria, rash, seizure activity, orthopnea, PND, pedal edema, claudication. Remaining systems are negative.  Physical Exam: Well-developed well-nourished in no acute distress.  Skin is warm and dry.  HEENT is normal.  Neck is supple.  Chest is clear to auscultation with normal expansion.  Cardiovascular exam is regular rate and rhythm.  Abdominal exam nontender or distended. No masses palpated. Extremities show no edema. neuro grossly intact     Electrocardiogram shows sinus rhythm, left anterior fascicular block and no ST  changes. RV conduction delay.

## 2014-07-01 NOTE — Therapy (Signed)
[facility] Outpatient Rehabilitation [facility] [address]  [address] [place], [place], [address] Phone: [phone]   Fax:  [phone]  Physical Therapy Treatment  Patient Details  Name: [name] [name] MRN: [id number] Date of Birth: 1945-12-08 Referring Provider:  Mckinley Jewel, MD  Encounter Date: 07/01/2014      PT End of Session - 07/01/14 0934    Visit Number 14   Number of Visits 17   Date for PT Re-Evaluation 07/16/14   PT Start Time 0933   PT Stop Time 1026   PT Time Calculation (min) 53 min   Activity Tolerance Patient tolerated treatment well      Past Medical History  Diagnosis Date  . Allergy   . Hypertension   . Hyperlipidemia   . IBS (irritable bowel syndrome)   . Arthritis   . Full dentures   . Wears glasses     readers  . Asthma     no inhalers  . PONV (postoperative nausea and vomiting)     Past Surgical History  Procedure Laterality Date  . Shoulder surgery      left  . Elbow surgery    . Knee surgery      lt  . Carpal tunnel release      rt  . Varicose vein surgery    . Nasal septum surgery    . Abdominal hysterectomy    . Cholecystectomy    . Appendectomy    . Tonsillectomy and adenoidectomy    . Breast lumpectomy  1985    lt  . Eye surgery      both cataracts  . Wrist arthroscopy Right 09/20/2013    Procedure: RIGHT ARTHROSCOPY WRIST EXCISION PISIFORM;  Surgeon: Wyn Forster, MD;  Location: [facility];  Service: Orthopedics;  Laterality: Right;  . Shoulder arthroscopy with bicepstenotomy Right 05/09/2014    Procedure: SHOULDER ARTHROSCOPY WITH BICEPSTENOTOMY;  Surgeon: Loreta Ave, MD;  Location: [facility];  Service: Orthopedics;  Laterality: Right;  Debridement Rotator Cuff Tear  . Shoulder acromioplasty Right 05/09/2014    Procedure: SHOULDER ACROMIOPLASTY;  Surgeon: Loreta Ave, MD;  Location: [facility];  Service: Orthopedics;  Laterality: Right;     There were no vitals filed for this visit.  Visit Diagnosis:  Pain in joint, shoulder region, right  Limitation of joint motion of right shoulder      Subjective Assessment - 07/01/14 0927    Symptoms Was doing well until it got cold.   Currently in Pain? Yes   Pain Score 5    Pain Location Shoulder   Pain Orientation Right   Aggravating Factors  cold, movement   Pain Relieving Factors rest, ice, meds     Today's Treatment UBE level 2.0 2'fwd/2'back bilateral and 30"/30" R only x2 PROM into flex, Abd, Er at 45 and 90deg of abduction, and IR to tolerance 2x10 each Prone  Extension 2# 2x10 with tcs at scapula for performance  T's 2# 2x10 with vcs/tcs and scapula to improve ROM/decrease pain             Retraction straight arm 2# 2x10 Seated Row BATCA: 15# 3x10 bilateral At wall bilateral shoulder ER  Red TB 3x10 vasopneumatic compression R shoulder, cold,  x low  PT Long Term Goals - 05/29/14 0925    PT LONG TERM GOAL #1   Title independent with advanced HEP by 07/16/14   Status On-going   PT LONG TERM GOAL #2   Title [facility] by 07/16/14   Status On-going   PT LONG TERM GOAL #3   Title R Shoulder MMT 4/5 or greater to assist with improved ADL status by 07/16/14   Status On-going   PT LONG TERM GOAL #4   Title return to pool for recreation/exercise with limitations as necessary by 07/16/14   Status On-going   PT LONG TERM GOAL #5   Title pt demonstate/verbalize info on physical activity to reduce risk of re-injury by 07/16/14   Status On-going               Plan - 07/01/14 1010    Clinical Impression Statement Continues with shoulder pain during ER, abduction, and flexion PROM and exercises but wants to continue working through it to improve strength   PT Next Visit Plan Continue per plan        Problem List Patient Active Problem List   Diagnosis Date Noted  . Chest  pain 05/09/2013  . Hypertension 02/23/2012  . Hyperlipidemia 02/23/2012  . Right shoulder pain 01/25/2012  . Left wrist pain 01/16/2012  . Neck pain 12/26/2011    Idamae Lusherevis, Cayne Yom R, DPT< [facility] 07/01/2014, 10:12 AM  [facility] [address]  [address] [place], [place], [address] Phone: [phone]   Fax:  [phone]

## 2014-07-03 ENCOUNTER — Encounter: Payer: Self-pay | Admitting: Cardiology

## 2014-07-03 ENCOUNTER — Ambulatory Visit (INDEPENDENT_AMBULATORY_CARE_PROVIDER_SITE_OTHER): Payer: Medicare Other | Admitting: Cardiology

## 2014-07-03 ENCOUNTER — Ambulatory Visit: Payer: Medicare Other | Admitting: Rehabilitation

## 2014-07-03 VITALS — BP 158/88 | HR 80 | Ht 65.0 in | Wt 153.1 lb

## 2014-07-03 DIAGNOSIS — R072 Precordial pain: Secondary | ICD-10-CM

## 2014-07-03 DIAGNOSIS — E785 Hyperlipidemia, unspecified: Secondary | ICD-10-CM | POA: Diagnosis not present

## 2014-07-03 DIAGNOSIS — I10 Essential (primary) hypertension: Secondary | ICD-10-CM

## 2014-07-03 DIAGNOSIS — M25611 Stiffness of right shoulder, not elsewhere classified: Secondary | ICD-10-CM

## 2014-07-03 DIAGNOSIS — M25511 Pain in right shoulder: Secondary | ICD-10-CM

## 2014-07-03 NOTE — Therapy (Signed)
[facility] Outpatient Rehabilitation [facility] [address]  [address] [place], [place], [address] Phone: [phone]   Fax:  [phone]  Physical Therapy Treatment  Patient Details  Name: [name] [name] MRN: [id number] Date of Birth: August 04, 1945 Referring Provider:  Mckinley Jewel, MD  Encounter Date: 07/03/2014      PT End of Session - 07/03/14 0934    Visit Number 15   Number of Visits 17   Date for PT Re-Evaluation 07/16/14   PT Start Time 0934   PT Stop Time 1028   PT Time Calculation (min) 54 min   Activity Tolerance Patient tolerated treatment well      Past Medical History  Diagnosis Date  . Allergy   . Hypertension   . Hyperlipidemia   . IBS (irritable bowel syndrome)   . Arthritis   . Full dentures   . Wears glasses     readers  . Asthma     no inhalers  . PONV (postoperative nausea and vomiting)     Past Surgical History  Procedure Laterality Date  . Shoulder surgery      left  . Elbow surgery    . Knee surgery      lt  . Carpal tunnel release      rt  . Varicose vein surgery    . Nasal septum surgery    . Abdominal hysterectomy    . Cholecystectomy    . Appendectomy    . Tonsillectomy and adenoidectomy    . Breast lumpectomy  1985    lt  . Eye surgery      both cataracts  . Wrist arthroscopy Right 09/20/2013    Procedure: RIGHT ARTHROSCOPY WRIST EXCISION PISIFORM;  Surgeon: Wyn Forster, MD;  Location: [facility];  Service: Orthopedics;  Laterality: Right;  . Shoulder arthroscopy with bicepstenotomy Right 05/09/2014    Procedure: SHOULDER ARTHROSCOPY WITH BICEPSTENOTOMY;  Surgeon: Loreta Ave, MD;  Location: [facility];  Service: Orthopedics;  Laterality: Right;  Debridement Rotator Cuff Tear  . Shoulder acromioplasty Right 05/09/2014    Procedure: SHOULDER ACROMIOPLASTY;  Surgeon: Loreta Ave, MD;  Location: [facility];  Service: Orthopedics;  Laterality: Right;     There were no vitals filed for this visit.  Visit Diagnosis:  Pain in joint, shoulder region, right  Limitation of joint motion of right shoulder    Today's Treatment UBE level 2.0 2'fwd/2'back bilateral and 1'/1' R only x2 Seated Row BATCA: 20# 2x10 bilateral PROM into flex, Abd, Er at 45 and 90deg of abduction, and IR to tolerance 2x10 each Supine IR/ER isometric at 45 degrees  Prone Extension 2# 2x12 with tcs at scapula for performance T's 2# 2x12 with vcs/tcs and scapula to improve ROM/decrease pain  Retraction straight arm 2# 2x12 At wall bilateral scaption x15 vasopneumatic compression R shoulder, cold, x low          PT Long Term Goals - 05/29/14 6962    PT LONG TERM GOAL #1   Title independent with advanced HEP by 07/16/14   Status On-going   PT LONG TERM GOAL #2   Title R shoulder AROM to [facility] for all ADLs by 07/16/14   Status On-going   PT LONG TERM GOAL #3   Title R Shoulder MMT 4/5 or greater to assist with improved ADL status by 07/16/14   Status On-going   PT LONG TERM GOAL #4   Title return  to pool for recreation/exercise with limitations as necessary by 07/16/14   Status On-going   PT LONG TERM GOAL #5   Title pt demonstate/verbalize info on physical activity to reduce risk of re-injury by 07/16/14   Status On-going               Plan - 07/03/14 1009    Clinical Impression Statement Still painful AROM and PROM for flexion, abduction, ER.    PT Next Visit Plan Continue   Consulted and Agree with Plan of Care Patient        Problem List Patient Active Problem List   Diagnosis Date Noted  . Chest pain 05/09/2013  . Hypertension 02/23/2012  . Hyperlipidemia 02/23/2012  . Right shoulder pain 01/25/2012  . Left wrist pain 01/16/2012  . Neck pain 12/26/2011    Ronney LionDUCKER, Aleena Kirkeby J, PTA 07/03/2014, 10:12 AM  [facility] [address]  [address] [place],  [place], [id number] Phone: [phone]   Fax:  [phone]

## 2014-07-03 NOTE — Assessment & Plan Note (Signed)
Blood pressure mildly elevated but typically controlled at home. Continue present medications and follow.

## 2014-07-03 NOTE — Assessment & Plan Note (Signed)
Symptoms atypical. No plans for further ischemia evaluation.

## 2014-07-03 NOTE — Patient Instructions (Signed)
Your physician wants you to follow-up in: ONE YEAR WITH DR CRENSHAW You will receive a reminder letter in the mail two months in advance. If you don't receive a letter, please call our office to schedule the follow-up appointment.  

## 2014-07-03 NOTE — Assessment & Plan Note (Signed)
Management per primary care. 

## 2014-07-08 ENCOUNTER — Ambulatory Visit: Payer: Medicare Other | Admitting: Rehabilitation

## 2014-07-08 DIAGNOSIS — M25611 Stiffness of right shoulder, not elsewhere classified: Secondary | ICD-10-CM

## 2014-07-08 DIAGNOSIS — M25511 Pain in right shoulder: Secondary | ICD-10-CM

## 2014-07-08 NOTE — Therapy (Signed)
[facility] Outpatient Rehabilitation [facility] [address]  [address] [place], [place], [address] Phone: [phone]   Fax:  [phone]  Physical Therapy Treatment  Patient Details  Name: [name] [name] MRN: [id number] Date of Birth: Aug 20, 1945 Referring Provider:  Mckinley Jewel, MD  Encounter Date: 07/08/2014      PT End of Session - 07/08/14 0935    Visit Number 16   Number of Visits 17   Date for PT Re-Evaluation 07/16/14   PT Start Time 0933   PT Stop Time 1029   PT Time Calculation (min) 56 min   Activity Tolerance Patient tolerated treatment well      Past Medical History  Diagnosis Date  . Allergy   . Hypertension   . Hyperlipidemia   . IBS (irritable bowel syndrome)   . Arthritis   . Full dentures   . Wears glasses     readers  . Asthma     no inhalers  . PONV (postoperative nausea and vomiting)     Past Surgical History  Procedure Laterality Date  . Shoulder surgery      left  . Elbow surgery    . Knee surgery      lt  . Carpal tunnel release      rt  . Varicose vein surgery    . Nasal septum surgery    . Abdominal hysterectomy    . Cholecystectomy    . Appendectomy    . Tonsillectomy and adenoidectomy    . Breast lumpectomy  1985    lt  . Eye surgery      both cataracts  . Wrist arthroscopy Right 09/20/2013    Procedure: RIGHT ARTHROSCOPY WRIST EXCISION PISIFORM;  Surgeon: Wyn Forster, MD;  Location: [facility];  Service: Orthopedics;  Laterality: Right;  . Shoulder arthroscopy with bicepstenotomy Right 05/09/2014    Procedure: SHOULDER ARTHROSCOPY WITH BICEPSTENOTOMY;  Surgeon: Loreta Ave, MD;  Location: [facility];  Service: Orthopedics;  Laterality: Right;  Debridement Rotator Cuff Tear  . Shoulder acromioplasty Right 05/09/2014    Procedure: SHOULDER ACROMIOPLASTY;  Surgeon: Loreta Ave, MD;  Location: [facility];  Service: Orthopedics;  Laterality: Right;     There were no vitals filed for this visit.  Visit Diagnosis:  Pain in joint, shoulder region, right  Limitation of joint motion of right shoulder      Subjective Assessment - 07/08/14 0936    Symptoms Says she would be doing great if this one spot didn't hurt (pec area).    Currently in Pain? Yes   Pain Score 6    Pain Location Shoulder   Pain Orientation Right   Pain Descriptors / Indicators Sharp;Stabbing      Today's Treatment UBE level 2.0 2'fwd/2'back bilateral and 1'/1' R only x2 Seated Row BATCA: 20# 2x15 bilateral Doorway pec stretch (low) 3x20" PROM into flex, Abd, Er at 45 and 90deg of abduction, and IR to tolerance 2x10 each TPR/STM to Lt pec and bicep area. Gentle GH distraction and AP mobs to pt tolerance Gr II-III.  vasopneumatic compression R shoulder, cold, x low             PT Long Term Goals - 05/29/14 0925    PT LONG TERM GOAL #1   Title independent with advanced HEP by 07/16/14   Status On-going   PT LONG TERM GOAL #2   Title R shoulder AROM to [facility]  for all ADLs by 07/16/14   Status On-going   PT LONG TERM GOAL #3   Title R Shoulder MMT 4/5 or greater to assist with improved ADL status by 07/16/14   Status On-going   PT LONG TERM GOAL #4   Title return to pool for recreation/exercise with limitations as necessary by 07/16/14   Status On-going   PT LONG TERM GOAL #5   Title pt demonstate/verbalize info on physical activity to reduce risk of re-injury by 07/16/14   Status On-going               Plan - 07/08/14 1014    Clinical Impression Statement Pec continues to be very tight and tender. Focused on STM today.    PT Next Visit Plan Renewal?   Consulted and Agree with Plan of Care Patient        Problem List Patient Active Problem List   Diagnosis Date Noted  . Chest pain 05/09/2013  . Hypertension 02/23/2012  . Hyperlipidemia 02/23/2012  . Right shoulder pain 01/25/2012  . Left wrist pain 01/16/2012  . Neck pain  12/26/2011    Ronney LionDUCKER, Amorie Rentz J, PTA 07/08/2014, 10:19 AM  [facility] [address]  [address] [place], [place], [address] Phone: [phone]   Fax:  [phone]

## 2014-07-11 ENCOUNTER — Ambulatory Visit: Payer: Medicare Other | Admitting: Rehabilitation

## 2014-07-11 ENCOUNTER — Encounter: Payer: Self-pay | Admitting: Rehabilitation

## 2014-07-11 DIAGNOSIS — M25511 Pain in right shoulder: Secondary | ICD-10-CM | POA: Diagnosis not present

## 2014-07-11 DIAGNOSIS — M25611 Stiffness of right shoulder, not elsewhere classified: Secondary | ICD-10-CM

## 2014-07-11 NOTE — Therapy (Signed)
[facility] [address]  [address] [place], [place], [address] Phone: [phone]   Fax:  [phone]  Physical Therapy Treatment  Patient Details  Name: [name] HampshireLynn C [name] MRN: [id number] Date of Birth: Apr 21, 1945 Referring Provider:  Mckinley JewelMurphy, Daniel, MD  Encounter Date: 07/11/2014      PT End of Session - 07/11/14 1027    Visit Number 17   Number of Visits 17   Date for PT Re-Evaluation 07/16/14   PT Start Time 0922   PT Stop Time 1008   PT Time Calculation (min) 46 min      Past Medical History  Diagnosis Date  . Allergy   . Hypertension   . Hyperlipidemia   . IBS (irritable bowel syndrome)   . Arthritis   . Full dentures   . Wears glasses     readers  . Asthma     no inhalers  . PONV (postoperative nausea and vomiting)     Past Surgical History  Procedure Laterality Date  . Shoulder surgery      left  . Elbow surgery    . Knee surgery      lt  . Carpal tunnel release      rt  . Varicose vein surgery    . Nasal septum surgery    . Abdominal hysterectomy    . Cholecystectomy    . Appendectomy    . Tonsillectomy and adenoidectomy    . Breast lumpectomy  1985    lt  . Eye surgery      both cataracts  . Wrist arthroscopy Right 09/20/2013    Procedure: RIGHT ARTHROSCOPY WRIST EXCISION PISIFORM;  Surgeon: [name], MD;  Location: [facility];  Service: Orthopedics;  Laterality: Right;  . Shoulder arthroscopy with bicepstenotomy Right 05/09/2014    Procedure: SHOULDER ARTHROSCOPY WITH BICEPSTENOTOMY;  Surgeon: [name], MD;  Location: [facility];  Service: Orthopedics;  Laterality: Right;  Debridement Rotator Cuff Tear  . Shoulder acromioplasty Right 05/09/2014    Procedure: SHOULDER ACROMIOPLASTY;  Surgeon: [name], MD;  Location: [facility];  Service: Orthopedics;  Laterality: Right;    There were no vitals filed for this visit.  Visit  Diagnosis:  Pain in joint, shoulder region, right  Limitation of joint motion of right shoulder      Subjective Assessment - 07/11/14 0924    Symptoms If it wasn't for that one spot I would be doing well.  Still having problems reaching back to get the bra.  Current HEP:  dowel rod flexion standing, abd AAROM, up the back, pulleys, standing 2# abduction. at 8 weeks today.  MD appointment 4/22.  Agreeable to attempt HEP/rest for the next 2 weeks to see if the anterior tendon infmallation calms down.    Currently in Pain? Yes   Pain Score 6    Pain Location Shoulder   Pain Orientation Right   Pain Descriptors / Indicators Aching;Stabbing   Aggravating Factors  cold, movement   Pain Relieving Factors rest, ice, meds            [facility] PT Assessment - 07/11/14 0001    AROM   Right Shoulder Flexion 150 Degrees  with pain   Right Shoulder ABduction 138 Degrees  pn   Right Shoulder Internal Rotation --  T12 with pain   PROM   Right Shoulder Flexion 170 Degrees  pain   Right Shoulder ABduction  170 Degrees  pain   Right Shoulder Internal Rotation 70 Degrees  pain   Right Shoulder External Rotation 90 Degrees  pain   Strength   Right Shoulder Flexion 4-/5  due to pain   Right Shoulder ABduction 4-/5  due to pain   Right Shoulder Internal Rotation 4-/5  due to pain   Right Shoulder External Rotation 4/5  pain   Palpation   Palpation +2 ttp anterior shoulder near bicep tendon /RC insertion      ROM/MMT status check Demonstrated HEP for the 2 week break with patient to perform painfree                           PT Long Term Goals - 05/29/14 0925    PT LONG TERM GOAL #1   Title independent with advanced HEP by 07/16/14   Status On-going   PT LONG TERM GOAL #2   Title R shoulder AROM to [facility] for all ADLs by 07/16/14   Status On-going   PT LONG TERM GOAL #3   Title R Shoulder MMT 4/5 or greater to assist with improved ADL status by 07/16/14   Status  On-going   PT LONG TERM GOAL #4   Title return to pool for recreation/exercise with limitations as necessary by 07/16/14   Status On-going   PT LONG TERM GOAL #5   Title pt demonstate/verbalize info on physical activity to reduce risk of re-injury by 07/16/14   Status On-going               Plan - 07/11/14 1028    Clinical Impression Statement Due to continued constant 6/10 anterior shoulder pain that increases with all exercises and good PROM status we decided to hold on therapy x 2 weeks with patient to perform HEP per instruction section painfree only.  Pain seems to be transiitioning into a tendonitis pain at the shoulder.     PT Next Visit Plan Renewal   Consulted and Agree with Plan of Care Patient        Problem List Patient Active Problem List   Diagnosis Date Noted  . Chest pain 05/09/2013  . Hypertension 02/23/2012  . Hyperlipidemia 02/23/2012  . Right shoulder pain 01/25/2012  . Left wrist pain 01/16/2012  . Neck pain 12/26/2011    [name] [name], DPT, CMP 07/11/2014, 10:32 AM  [facility] [address]  [address] [place], [place], [address] Phone: [phone]   Fax:  [phone]

## 2014-07-15 ENCOUNTER — Ambulatory Visit: Payer: Medicare Other | Admitting: Rehabilitation

## 2014-07-18 ENCOUNTER — Ambulatory Visit: Payer: Medicare Other | Admitting: Rehabilitation

## 2014-07-30 ENCOUNTER — Ambulatory Visit: Payer: Medicare Other | Attending: Orthopedic Surgery | Admitting: Physical Therapy

## 2014-07-30 DIAGNOSIS — M25511 Pain in right shoulder: Secondary | ICD-10-CM | POA: Insufficient documentation

## 2014-07-30 DIAGNOSIS — Z9889 Other specified postprocedural states: Secondary | ICD-10-CM | POA: Diagnosis not present

## 2014-07-30 DIAGNOSIS — M25611 Stiffness of right shoulder, not elsewhere classified: Secondary | ICD-10-CM | POA: Diagnosis not present

## 2014-07-30 NOTE — Therapy (Signed)
[facility] [address]  [place], [place], [address] Phone: [phone]   Fax:  [phone]  Physical Therapy Treatment/Renewal  Patient Details  Name: [name] [name] MRN: [id number] Date of Birth: 11/25/1945 Referring Provider:  Kathryne Hitch, MD  Encounter Date: 07/30/2014      PT End of Session - 07/30/14 1112    Visit Number 18   Number of Visits 24   Date for PT Re-Evaluation 08/29/14   PT Start Time 0930   PT Stop Time 1010   PT Time Calculation (min) 40 min   Activity Tolerance Patient tolerated treatment well;Patient limited by pain   Behavior During Therapy [facility] for tasks assessed/performed      Past Medical History  Diagnosis Date  . Allergy   . Hypertension   . Hyperlipidemia   . IBS (irritable bowel syndrome)   . Arthritis   . Full dentures   . Wears glasses     readers  . Asthma     no inhalers  . PONV (postoperative nausea and vomiting)     Past Surgical History  Procedure Laterality Date  . Shoulder surgery      left  . Elbow surgery    . Knee surgery      lt  . Carpal tunnel release      rt  . Varicose vein surgery    . Nasal septum surgery    . Abdominal hysterectomy    . Cholecystectomy    . Appendectomy    . Tonsillectomy and adenoidectomy    . Breast lumpectomy  1985    lt  . Eye surgery      both cataracts  . Wrist arthroscopy Right 09/20/2013    Procedure: RIGHT ARTHROSCOPY WRIST EXCISION PISIFORM;  Surgeon: Cammie Sickle, MD;  Location: [place];  Service: Orthopedics;  Laterality: Right;  . Shoulder arthroscopy with bicepstenotomy Right 05/09/2014    Procedure: SHOULDER ARTHROSCOPY WITH BICEPSTENOTOMY;  Surgeon: Ninetta Lights, MD;  Location: [place];  Service: Orthopedics;  Laterality: Right;  Debridement Rotator Cuff Tear  . Shoulder acromioplasty Right 05/09/2014    Procedure: SHOULDER ACROMIOPLASTY;  Surgeon: Ninetta Lights, MD;  Location: [place];  Service: Orthopedics;  Laterality: Right;    There were no vitals filed for this visit.  Visit Diagnosis:  Pain in joint, shoulder region, right - Plan: PT plan of care cert/re-cert  Limitation of joint motion of right shoulder - Plan: PT plan of care cert/re-cert      Subjective Assessment - 07/30/14 0931    Subjective Still having a dull ache with sharp pains along proximal biceps tendon.  Nothing has changed over past 2 weeks.  Sees MD this Friday 4/22.     Currently in Pain? Yes   Pain Score 5    Pain Location Shoulder   Pain Orientation Right;Anterior            [facility] PT Assessment - 07/30/14 0943    AROM   Right Shoulder Flexion 140 Degrees   Right Shoulder ABduction 135 Degrees   Right Shoulder Internal Rotation --  FIR to R L4/5    Right Shoulder External Rotation --  FER to ear   PROM   Right Shoulder Flexion 160 Degrees   Right Shoulder ABduction 150 Degrees   Strength   Right Shoulder Flexion 3+/5   Right Shoulder ABduction 3+/5   Right Shoulder  Internal Rotation 4/5   Right Shoulder External Rotation 3+/5   Palpation   Palpation +2 ttp anterior shoulder near bicep tendon /RC insertion                     [facility] Adult PT Treatment/Exercise - 07/30/14 1108    Modalities   Modalities Ultrasound   Ultrasound   Ultrasound Location R proximal biceps tendon   Ultrasound Parameters 50% DC, 3.3 mHz, 1.0 w/cm2 x 8 min   Ultrasound Goals Pain   Manual Therapy   Manual Therapy Massage   Massage To Rt pec, bicep area                PT Education - 07/30/14 1111    Education provided Yes   Education Details POC; current progress.  Extensive discussion about current symptoms and indications to continue PT.  Agree to trial PT if ionto approved to see if pt has some relief.  If no benefit may need to consider d/c due to no change in functional progress.   Person(s) Educated Patient   Methods  Explanation   Comprehension Verbalized understanding             PT Long Term Goals - 07/30/14 1115    PT LONG TERM GOAL #1   Title independent with advanced HEP by 08/29/14   Time 4   Period Weeks   Status Revised   PT LONG TERM GOAL #2   Title R shoulder AROM to [facility] for all ADLs by 08/29/14 (flexion and abdct met with pain; IR and ER not met)   Time 4   Period Weeks   Status Revised   PT LONG TERM GOAL #3   Title R Shoulder MMT 4/5 or greater to assist with improved ADL status by 08/29/14   Time 4   Period Weeks   Status Revised   PT LONG TERM GOAL #4   Title return to pool for recreation/exercise with limitations as necessary by 08/29/14   Time 4   Period Weeks   Status Revised   PT LONG TERM GOAL #5   Title pt demonstate/verbalize info on physical activity to reduce risk of re-injury by 07/16/14   Status Achieved               Plan - 07/30/14 1113    Clinical Impression Statement Pt with no significant change in pain, symptoms or function since holding PT.  At this time will send request for ionto and try x 2-3 sessions (if approved) to see if pt notices relief.  If no improvement in symptoms recommend return to MD.   Pt will benefit from skilled therapeutic intervention in order to improve on the following deficits Decreased range of motion;Pain;Impaired UE functional use;Decreased mobility;Decreased strength   Rehab Potential Good   PT Frequency 2x / week   PT Duration 3 weeks   PT Treatment/Interventions Moist Heat;Therapeutic activities;Patient/family education;Therapeutic exercise;Passive range of motion;Manual techniques;Electrical Stimulation;Cryotherapy;ADLs/Self Care Home Management;Ultrasound  iontophoresis   PT Next Visit Plan ionto if signed; u/s and manual   Consulted and Agree with Plan of Care Patient        Problem List Patient Active Problem List   Diagnosis Date Noted  . Chest pain 05/09/2013  . Hypertension 02/23/2012  . Hyperlipidemia  02/23/2012  . Right shoulder pain 01/25/2012  . Left wrist pain 01/16/2012  . Neck pain 12/26/2011   Laureen Abrahams, PT, DPT 07/30/2014 11:20 AM  [facility] [address]  [place], [place], [address] Phone: [phone]   Fax:  [phone]

## 2014-08-01 ENCOUNTER — Ambulatory Visit: Payer: Medicare Other | Admitting: Rehabilitation

## 2014-08-01 DIAGNOSIS — M25511 Pain in right shoulder: Secondary | ICD-10-CM | POA: Diagnosis not present

## 2014-08-01 DIAGNOSIS — M25611 Stiffness of right shoulder, not elsewhere classified: Secondary | ICD-10-CM

## 2014-08-01 NOTE — Therapy (Addendum)
[facility] [address]  [place], [place], [address] Phone: [phone]   Fax:  [phone]  Physical Therapy Treatment  Patient Details  Name: [name] [name] MRN: [id number] Date of Birth: Mar 16, 1946 Referring Provider:  Kathryne Hitch, MD  Encounter Date: 08/01/2014      PT End of Session - 08/01/14 1058    Visit Number 19   Number of Visits 24   Date for PT Re-Evaluation 08/29/14   PT Start Time 5852   PT Stop Time 1130   PT Time Calculation (min) 32 min      Past Medical History  Diagnosis Date  . Allergy   . Hypertension   . Hyperlipidemia   . IBS (irritable bowel syndrome)   . Arthritis   . Full dentures   . Wears glasses     readers  . Asthma     no inhalers  . PONV (postoperative nausea and vomiting)     Past Surgical History  Procedure Laterality Date  . Shoulder surgery      left  . Elbow surgery    . Knee surgery      lt  . Carpal tunnel release      rt  . Varicose vein surgery    . Nasal septum surgery    . Abdominal hysterectomy    . Cholecystectomy    . Appendectomy    . Tonsillectomy and adenoidectomy    . Breast lumpectomy  1985    lt  . Eye surgery      both cataracts  . Wrist arthroscopy Right 09/20/2013    Procedure: RIGHT ARTHROSCOPY WRIST EXCISION PISIFORM;  Surgeon: Cammie Sickle, MD;  Location: [place] [facility];  Service: Orthopedics;  Laterality: Right;  . Shoulder arthroscopy with bicepstenotomy Right 05/09/2014    Procedure: SHOULDER ARTHROSCOPY WITH BICEPSTENOTOMY;  Surgeon: Ninetta Lights, MD;  Location: [facility];  Service: Orthopedics;  Laterality: Right;  Debridement Rotator Cuff Tear  . Shoulder acromioplasty Right 05/09/2014    Procedure: SHOULDER ACROMIOPLASTY;  Surgeon: Ninetta Lights, MD;  Location: [place];  Service: Orthopedics;  Laterality: Right;    There were no vitals filed for this visit.  Visit  Diagnosis:  Pain in joint, shoulder region, right  Limitation of joint motion of right shoulder      Subjective Assessment - 08/01/14 1144    Subjective Continues with pain and reported some pain after last time following the ultrasound.    Currently in Pain? Yes   Pain Score 5    Pain Location Shoulder   Pain Orientation Right;Anterior              [facility] Adult PT Treatment/Exercise - 08/01/14 1143    Shoulder Exercises: ROM/Strengthening   UBE (Upper Arm Bike) level 2.0 2'/2'   Modalities   Modalities Ultrasound   Ultrasound   Ultrasound Location Rt proximal bicep tendon   Ultrasound Parameters 50% DC, 3.36mz, 0.8w/cm2   Ultrasound Goals Pain   Manual Therapy   Manual Therapy Massage   Massage To Rt pec, bicep area            PT Long Term Goals - 07/30/14 1115    PT LONG TERM GOAL #1   Title independent with advanced HEP by 08/29/14   Time 4   Period Weeks   Status Revised   PT LONG TERM GOAL #2   Title R shoulder AROM  to [facility] for all ADLs by 08/29/14 (flexion and abdct met with pain; IR and ER not met)   Time 4   Period Weeks   Status Revised   PT LONG TERM GOAL #3   Title R Shoulder MMT 4/5 or greater to assist with improved ADL status by 08/29/14   Time 4   Period Weeks   Status Revised   PT LONG TERM GOAL #4   Title return to pool for recreation/exercise with limitations as necessary by 08/29/14   Time 4   Period Weeks   Status Revised   PT LONG TERM GOAL #5   Title pt demonstate/verbalize info on physical activity to reduce risk of re-injury by 07/16/14   Status Achieved               Plan - 08/01/14 1145    Clinical Impression Statement Did not perform ionto today due to possible side-effects of hypertension and pt has had a reaction similar to this in the past with steriods. Pt sees MD tomorrow and we will see how MD wants to proceed.    PT Next Visit Plan Continue ultrasound and manual. See what MD says   Consulted and Agree with Plan of  Care Patient     Late Entry FOTO: 57 (43% limited)  Late Entry G-Code:  Functional Assessment Tool Used: FOTO Functional Limitation: Mobility: walking and moving around Goal Status: CK Discharge Status: CK  Laureen Abrahams, PT, DPT 08/06/2014 11:36 AM    Problem List Patient Active Problem List   Diagnosis Date Noted  . Chest pain 05/09/2013  . Hypertension 02/23/2012  . Hyperlipidemia 02/23/2012  . Right shoulder pain 01/25/2012  . Left wrist pain 01/16/2012  . Neck pain 12/26/2011    Barbette Hair, PTA 08/01/2014, 11:48 AM  [facility] [place], [place], [address] Phone: [phone]   Fax:  [phone]     PHYSICAL THERAPY DISCHARGE SUMMARY  Visits from Start of Care: 19  Current functional level related to goals / functional outcomes: See above; pt d/c to home program per MD request   Remaining deficits: Unknown; pt received injection so unsure if pain and functional limitations still present.   Education / Equipment: HEP  Plan: Patient agrees to discharge.  Patient goals were partially met. Patient is being discharged due to the physician's request.  ?????      Laureen Abrahams, PT, DPT 08/06/2014 11:37 AM  [facility] [place], [place] [address]  [phone] (office) [phone] (fax)

## 2014-08-06 ENCOUNTER — Ambulatory Visit: Payer: Medicare Other

## 2014-10-16 ENCOUNTER — Other Ambulatory Visit: Payer: Self-pay

## 2014-10-16 DIAGNOSIS — Z1231 Encounter for screening mammogram for malignant neoplasm of breast: Secondary | ICD-10-CM

## 2014-11-19 ENCOUNTER — Ambulatory Visit: Payer: Medicare Other

## 2014-11-28 ENCOUNTER — Ambulatory Visit: Payer: Medicare Other

## 2014-12-09 ENCOUNTER — Other Ambulatory Visit: Payer: Self-pay | Admitting: Physician Assistant

## 2014-12-09 NOTE — H&P (Signed)
This is a pleasant 69 year-old female who presents to our clinic today with recurrent right shoulder pain.  [name] [name] is status post right shoulder arthroscopic decompression and debridement of her rotator cuff.  Date of surgery: May 09, 2014.  [name] [name] has struggled with pain really since operative intervention.  We have tried a subacromial injection for the pain with no relief of symptoms.  It was noted during ultrasound back in June that she had a large calcific tendinopathy within the subscapularis and supraspinatus.  She underwent barbotage which really didn't help her symptoms.  She continues to complain of an ice pick type pain to the right shoulder with external rotation and internal rotation.  She has tried everything she can to decrease her symptoms, but is getting rather frustrated.  She comes in today to discuss definitive treatment.   Past medical, social and family history reviewed in detail on the patient questionnaire and signed.  Review of systems: As detailed in HPI.  All others reviewed and are negative.    EXAMINATION: Well-developed, well-nourished female in no acute distress.  Alert and oriented x 3.  Examination of her right shoulder reveals full active range of motion in all directions.  She does have marked pain with external and internal rotation.  She is neurovascularly intact distally.    IMPRESSION: Right shoulder calcific tendinopathy.    PLAN: Looking back on her MRI from late 2015, it is not noted that there are any calcific deposits.  There were none noted during operative intervention back in January.  We do not feel it is appropriate to get a new MRI of her shoulder.  We are going to proceed with a right shoulder arthroscopic debridement of calcific deposits, as well as a rotator cuff repair if necessary.  Risks, benefits and possible complications reviewed.  Rehab and recovery time discussed.  All questions answered.  Paperwork complete.     Daniel F. Murphy, M.D.  

## 2014-12-09 NOTE — H&P (Signed)
This is a pleasant 69 year-old female who presents to our clinic today with recurrent right shoulder pain.  [name] [name] is status post right shoulder arthroscopic decompression and debridement of her rotator cuff.  Date of surgery: May 09, 2014.  [name] [name] has struggled with pain really since operative intervention.  We have tried a subacromial injection for the pain with no relief of symptoms.  It was noted during ultrasound back in June that she had a large calcific tendinopathy within the subscapularis and supraspinatus.  She underwent barbotage which really didn't help her symptoms.  She continues to complain of an ice pick type pain to the right shoulder with external rotation and internal rotation.  She has tried everything she can to decrease her symptoms, but is getting rather frustrated.  She comes in today to discuss definitive treatment.   Past medical, social and family history reviewed in detail on the patient questionnaire and signed.  Review of systems: As detailed in HPI.  All others reviewed and are negative.    EXAMINATION: Well-developed, well-nourished female in no acute distress.  Alert and oriented x 3.  Examination of her right shoulder reveals full active range of motion in all directions.  She does have marked pain with external and internal rotation.  She is neurovascularly intact distally.    IMPRESSION: Right shoulder calcific tendinopathy.    PLAN: Looking back on her MRI from late 2015, it is not noted that there are any calcific deposits.  There were none noted during operative intervention back in January.  We do not feel it is appropriate to get a new MRI of her shoulder.  We are going to proceed with a right shoulder arthroscopic debridement of calcific deposits, as well as a rotator cuff repair if necessary.  Risks, benefits and possible complications reviewed.  Rehab and recovery time discussed.  All questions answered.  Paperwork complete.     Loreta Ave, M.D.

## 2014-12-10 ENCOUNTER — Other Ambulatory Visit: Payer: Self-pay | Admitting: *Deleted

## 2014-12-10 MED ORDER — OLMESARTAN MEDOXOMIL 40 MG PO TABS: 20.0000 mg | ORAL_TABLET | Freq: Every day | ORAL | Status: DC

## 2014-12-12 ENCOUNTER — Encounter (HOSPITAL_BASED_OUTPATIENT_CLINIC_OR_DEPARTMENT_OTHER): Payer: Self-pay | Admitting: *Deleted

## 2014-12-13 ENCOUNTER — Other Ambulatory Visit: Payer: Self-pay | Admitting: *Deleted

## 2014-12-13 ENCOUNTER — Telehealth: Payer: Self-pay | Admitting: Cardiology

## 2014-12-13 ENCOUNTER — Encounter (HOSPITAL_BASED_OUTPATIENT_CLINIC_OR_DEPARTMENT_OTHER)
Admission: RE | Admit: 2014-12-13 | Discharge: 2014-12-13 | Disposition: A | Discharge status: Home / Self Care | Payer: Medicare Other | Source: Ambulatory Visit | Attending: Orthopedic Surgery | Admitting: Orthopedic Surgery

## 2014-12-13 DIAGNOSIS — M75101 Unspecified rotator cuff tear or rupture of right shoulder, not specified as traumatic: Secondary | ICD-10-CM | POA: Diagnosis present

## 2014-12-13 DIAGNOSIS — Z9889 Other specified postprocedural states: Secondary | ICD-10-CM | POA: Diagnosis not present

## 2014-12-13 DIAGNOSIS — I10 Essential (primary) hypertension: Secondary | ICD-10-CM | POA: Diagnosis not present

## 2014-12-13 DIAGNOSIS — M7591 Shoulder lesion, unspecified, right shoulder: Secondary | ICD-10-CM | POA: Diagnosis not present

## 2014-12-13 DIAGNOSIS — Z79899 Other long term (current) drug therapy: Secondary | ICD-10-CM | POA: Diagnosis not present

## 2014-12-13 LAB — BASIC METABOLIC PANEL
ANION GAP: 8 (ref 5–15)
BUN: 9 mg/dL (ref 6–20)
CO2: 25 mmol/L (ref 22–32)
Calcium: 9.1 mg/dL (ref 8.9–10.3)
Chloride: 102 mmol/L (ref 101–111)
Creatinine, Ser: 0.88 mg/dL (ref 0.44–1.00)
Glucose, Bld: 95 mg/dL (ref 65–99)
POTASSIUM: 4.9 mmol/L (ref 3.5–5.1)
SODIUM: 135 mmol/L (ref 135–145)

## 2014-12-13 MED ORDER — OLMESARTAN MEDOXOMIL 40 MG PO TABS: 20.0000 mg | ORAL_TABLET | Freq: Every day | ORAL | Status: DC

## 2014-12-13 NOTE — Telephone Encounter (Signed)
When pt picked up her Benicar 40 mg,she only received #15. She says she usually get #90 and refills. Please call the correct amount to [phone].

## 2014-12-19 ENCOUNTER — Ambulatory Visit (HOSPITAL_BASED_OUTPATIENT_CLINIC_OR_DEPARTMENT_OTHER): Payer: Medicare Other | Admitting: Anesthesiology

## 2014-12-19 ENCOUNTER — Ambulatory Visit (HOSPITAL_BASED_OUTPATIENT_CLINIC_OR_DEPARTMENT_OTHER)
Admission: RE | Admit: 2014-12-19 | Discharge: 2014-12-19 | Disposition: A | Discharge status: Home / Self Care | Payer: Medicare Other | Source: Ambulatory Visit | Attending: Orthopedic Surgery | Admitting: Orthopedic Surgery

## 2014-12-19 ENCOUNTER — Encounter (HOSPITAL_BASED_OUTPATIENT_CLINIC_OR_DEPARTMENT_OTHER)
Admission: RE | Disposition: A | Discharge status: Home / Self Care | Payer: Self-pay | Source: Ambulatory Visit | Attending: Orthopedic Surgery

## 2014-12-19 ENCOUNTER — Encounter (HOSPITAL_BASED_OUTPATIENT_CLINIC_OR_DEPARTMENT_OTHER): Payer: Self-pay | Admitting: *Deleted

## 2014-12-19 DIAGNOSIS — Z9889 Other specified postprocedural states: Secondary | ICD-10-CM | POA: Insufficient documentation

## 2014-12-19 DIAGNOSIS — Z79899 Other long term (current) drug therapy: Secondary | ICD-10-CM | POA: Insufficient documentation

## 2014-12-19 DIAGNOSIS — M7591 Shoulder lesion, unspecified, right shoulder: Secondary | ICD-10-CM | POA: Insufficient documentation

## 2014-12-19 DIAGNOSIS — M75101 Unspecified rotator cuff tear or rupture of right shoulder, not specified as traumatic: Secondary | ICD-10-CM | POA: Insufficient documentation

## 2014-12-19 DIAGNOSIS — I10 Essential (primary) hypertension: Secondary | ICD-10-CM | POA: Insufficient documentation

## 2014-12-19 HISTORY — PX: SHOULDER ARTHROSCOPY WITH ROTATOR CUFF REPAIR: SHX5685

## 2014-12-19 SURGERY — ARTHROSCOPY, SHOULDER, WITH ROTATOR CUFF REPAIR
Anesthesia: Regional | Site: Shoulder | Laterality: Right

## 2014-12-19 MED ORDER — MIDAZOLAM HCL 2 MG/2ML IJ SOLN
1.0000 mg | INTRAMUSCULAR | Status: DC | PRN
  Administered 2014-12-19: 2 mg via INTRAVENOUS

## 2014-12-19 MED ORDER — FENTANYL CITRATE (PF) 100 MCG/2ML IJ SOLN
50.0000 ug | INTRAMUSCULAR | Status: AC | PRN
  Administered 2014-12-19: 50 ug via INTRAVENOUS
  Administered 2014-12-19: 25 ug via INTRAVENOUS
  Administered 2014-12-19: 100 ug via INTRAVENOUS

## 2014-12-19 MED ORDER — HYDROMORPHONE HCL 1 MG/ML IJ SOLN: 0.2500 mg | INTRAMUSCULAR | Status: DC | PRN

## 2014-12-19 MED ORDER — SCOPOLAMINE 1 MG/3DAYS TD PT72
MEDICATED_PATCH | TRANSDERMAL | Status: AC
Start: 2014-12-19 — End: 2014-12-19
  Filled 2014-12-19: qty 1

## 2014-12-19 MED ORDER — CHLORHEXIDINE GLUCONATE 4 % EX LIQD: 60.0000 mL | Freq: Once | CUTANEOUS | Status: DC

## 2014-12-19 MED ORDER — LACTATED RINGERS IV SOLN
INTRAVENOUS | Status: DC
  Administered 2014-12-19 (×2): via INTRAVENOUS

## 2014-12-19 MED ORDER — SUCCINYLCHOLINE CHLORIDE 20 MG/ML IJ SOLN
INTRAMUSCULAR | Status: DC | PRN
  Administered 2014-12-19: 80 mg via INTRAVENOUS

## 2014-12-19 MED ORDER — PROPOFOL 10 MG/ML IV BOLUS
INTRAVENOUS | Status: DC | PRN
  Administered 2014-12-19: 140 mg via INTRAVENOUS

## 2014-12-19 MED ORDER — CLINDAMYCIN PHOSPHATE 900 MG/50ML IV SOLN
INTRAVENOUS | Status: AC
  Filled 2014-12-19: qty 50

## 2014-12-19 MED ORDER — LACTATED RINGERS IV SOLN: INTRAVENOUS | Status: DC

## 2014-12-19 MED ORDER — PROMETHAZINE HCL 25 MG/ML IJ SOLN
6.2500 mg | Freq: Once | INTRAMUSCULAR | Status: AC
  Administered 2014-12-19: 6.25 mg via INTRAVENOUS

## 2014-12-19 MED ORDER — OXYCODONE HCL 5 MG/5ML PO SOLN: 5.0000 mg | Freq: Once | ORAL | Status: DC | PRN

## 2014-12-19 MED ORDER — SCOPOLAMINE 1 MG/3DAYS TD PT72
1.0000 | MEDICATED_PATCH | Freq: Once | TRANSDERMAL | Status: DC | PRN
  Administered 2014-12-19: 1.5 mg via TRANSDERMAL

## 2014-12-19 MED ORDER — OXYCODONE HCL 5 MG PO TABS: 5.0000 mg | ORAL_TABLET | Freq: Once | ORAL | Status: DC | PRN

## 2014-12-19 MED ORDER — EPHEDRINE SULFATE 50 MG/ML IJ SOLN
INTRAMUSCULAR | Status: DC | PRN
  Administered 2014-12-19 (×2): 10 mg via INTRAVENOUS

## 2014-12-19 MED ORDER — GLYCOPYRROLATE 0.2 MG/ML IJ SOLN: 0.2000 mg | Freq: Once | INTRAMUSCULAR | Status: DC | PRN

## 2014-12-19 MED ORDER — HYDROCODONE-ACETAMINOPHEN 7.5-325 MG PO TABS: 1.0000 | ORAL_TABLET | ORAL | Status: DC | PRN

## 2014-12-19 MED ORDER — ONDANSETRON HCL 4 MG/2ML IJ SOLN
4.0000 mg | Freq: Once | INTRAMUSCULAR | Status: AC
  Administered 2014-12-19: 4 mg via INTRAVENOUS

## 2014-12-19 MED ORDER — PROMETHAZINE HCL 25 MG/ML IJ SOLN
INTRAMUSCULAR | Status: AC
  Filled 2014-12-19: qty 1

## 2014-12-19 MED ORDER — PHENYLEPHRINE 40 MCG/ML (10ML) SYRINGE FOR IV PUSH (FOR BLOOD PRESSURE SUPPORT)
PREFILLED_SYRINGE | INTRAVENOUS | Status: AC
  Filled 2014-12-19: qty 10

## 2014-12-19 MED ORDER — MIDAZOLAM HCL 2 MG/2ML IJ SOLN
INTRAMUSCULAR | Status: AC
  Filled 2014-12-19: qty 2

## 2014-12-19 MED ORDER — DEXAMETHASONE SODIUM PHOSPHATE 4 MG/ML IJ SOLN
INTRAMUSCULAR | Status: DC | PRN
  Administered 2014-12-19: 10 mg via INTRAVENOUS

## 2014-12-19 MED ORDER — CLINDAMYCIN PHOSPHATE 900 MG/50ML IV SOLN
900.0000 mg | INTRAVENOUS | Status: AC
  Administered 2014-12-19: 900 mg via INTRAVENOUS

## 2014-12-19 MED ORDER — HYDROMORPHONE HCL 2 MG PO TABS: ORAL_TABLET | ORAL | Status: DC

## 2014-12-19 MED ORDER — BUPIVACAINE-EPINEPHRINE (PF) 0.5% -1:200000 IJ SOLN
INTRAMUSCULAR | Status: DC | PRN
  Administered 2014-12-19: 25 mL via PERINEURAL

## 2014-12-19 MED ORDER — ONDANSETRON HCL 4 MG/2ML IJ SOLN
INTRAMUSCULAR | Status: AC
  Filled 2014-12-19: qty 2

## 2014-12-19 MED ORDER — ACETAMINOPHEN 160 MG/5ML PO SOLN
325.0000 mg | ORAL | Status: DC | PRN
Start: 2014-12-19 — End: 2014-12-19

## 2014-12-19 MED ORDER — ONDANSETRON HCL 4 MG PO TABS: 4.0000 mg | ORAL_TABLET | Freq: Three times a day (TID) | ORAL | Status: DC | PRN

## 2014-12-19 MED ORDER — PHENYLEPHRINE HCL 10 MG/ML IJ SOLN
INTRAMUSCULAR | Status: DC | PRN
  Administered 2014-12-19: 40 ug via INTRAVENOUS

## 2014-12-19 MED ORDER — LIDOCAINE HCL (CARDIAC) 20 MG/ML IV SOLN
INTRAVENOUS | Status: AC
  Filled 2014-12-19: qty 5

## 2014-12-19 MED ORDER — LIDOCAINE HCL (CARDIAC) 20 MG/ML IV SOLN
INTRAVENOUS | Status: DC | PRN
Start: 2014-12-19 — End: 2014-12-19
  Administered 2014-12-19: 50 mg via INTRAVENOUS

## 2014-12-19 MED ORDER — ACETAMINOPHEN 325 MG PO TABS: 325.0000 mg | ORAL_TABLET | ORAL | Status: DC | PRN

## 2014-12-19 MED ORDER — FENTANYL CITRATE (PF) 100 MCG/2ML IJ SOLN
INTRAMUSCULAR | Status: AC
  Filled 2014-12-19: qty 2

## 2014-12-19 MED ORDER — FENTANYL CITRATE (PF) 100 MCG/2ML IJ SOLN
INTRAMUSCULAR | Status: AC
  Filled 2014-12-19: qty 4

## 2014-12-19 MED ORDER — PROPOFOL 10 MG/ML IV BOLUS
INTRAVENOUS | Status: AC
  Filled 2014-12-19: qty 20

## 2014-12-19 MED ORDER — DEXAMETHASONE SODIUM PHOSPHATE 10 MG/ML IJ SOLN
INTRAMUSCULAR | Status: AC
  Filled 2014-12-19: qty 1

## 2014-12-19 SURGICAL SUPPLY — 75 items
ANCH SUT SWLK 19.1X4.75 (Anchor) ×2 IMPLANT
ANCHOR SUT BIO SW 4.75X19.1 (Anchor) ×6 IMPLANT
APL SKNCLS STERI-STRIP NONHPOA (GAUZE/BANDAGES/DRESSINGS)
BENZOIN TINCTURE PRP APPL 2/3 (GAUZE/BANDAGES/DRESSINGS) IMPLANT
BLADE CUTTER GATOR 3.5 (BLADE) ×3 IMPLANT
BLADE CUTTER MENIS 5.5 (BLADE) IMPLANT
BLADE GREAT WHITE 4.2 (BLADE) ×2 IMPLANT
BLADE GREAT WHITE 4.2MM (BLADE) ×1
BLADE SURG 15 STRL LF DISP TIS (BLADE) IMPLANT
BLADE SURG 15 STRL SS (BLADE)
BUR OVAL 6.0 (BURR) ×3 IMPLANT
CANNULA DRY DOC 8X75 (CANNULA) ×3 IMPLANT
CANNULA TWIST IN 8.25X7CM (CANNULA) IMPLANT
CLOSURE WOUND 1/2 X4 (GAUZE/BANDAGES/DRESSINGS)
DECANTER SPIKE VIAL GLASS SM (MISCELLANEOUS) IMPLANT
DRAPE OEC MINIVIEW 54X84 (DRAPES) ×3 IMPLANT
DRAPE STERI 35X30 U-POUCH (DRAPES) ×3 IMPLANT
DRAPE U-SHAPE 47X51 STRL (DRAPES) ×3 IMPLANT
DRAPE U-SHAPE 76X120 STRL (DRAPES) ×6 IMPLANT
DRSG PAD ABDOMINAL 8X10 ST (GAUZE/BANDAGES/DRESSINGS) ×3 IMPLANT
DURAPREP 26ML APPLICATOR (WOUND CARE) IMPLANT
ELECT MENISCUS 165MM 90D (ELECTRODE) ×3 IMPLANT
ELECT REM PT RETURN 9FT ADLT (ELECTROSURGICAL) ×3
ELECTRODE REM PT RTRN 9FT ADLT (ELECTROSURGICAL) ×1 IMPLANT
GAUZE SPONGE 4X4 12PLY STRL (GAUZE/BANDAGES/DRESSINGS) ×6 IMPLANT
GAUZE XEROFORM 1X8 LF (GAUZE/BANDAGES/DRESSINGS) ×3 IMPLANT
GLOVE BIO SURGEON STRL SZ 6.5 (GLOVE) ×2 IMPLANT
GLOVE BIO SURGEONS STRL SZ 6.5 (GLOVE) ×1
GLOVE BIOGEL PI IND STRL 7.0 (GLOVE) ×3 IMPLANT
GLOVE BIOGEL PI INDICATOR 7.0 (GLOVE) ×6
GLOVE ECLIPSE 7.0 STRL STRAW (GLOVE) ×3 IMPLANT
GLOVE ORTHO TXT STRL SZ7.5 (GLOVE) IMPLANT
GLOVE SURG ORTHO 8.0 STRL STRW (GLOVE) ×3 IMPLANT
GOWN STRL REUS W/ TWL LRG LVL3 (GOWN DISPOSABLE) ×2 IMPLANT
GOWN STRL REUS W/ TWL XL LVL3 (GOWN DISPOSABLE) ×1 IMPLANT
GOWN STRL REUS W/TWL LRG LVL3 (GOWN DISPOSABLE) ×6
GOWN STRL REUS W/TWL XL LVL3 (GOWN DISPOSABLE) ×3
IV NS IRRIG 3000ML ARTHROMATIC (IV SOLUTION) ×12 IMPLANT
MANIFOLD NEPTUNE II (INSTRUMENTS) ×3 IMPLANT
NDL SUT 6 .5 CRC .975X.05 MAYO (NEEDLE) IMPLANT
NEEDLE MAYO TAPER (NEEDLE)
NEEDLE SCORPION MULTI FIRE (NEEDLE) ×3 IMPLANT
NS IRRIG 1000ML POUR BTL (IV SOLUTION) IMPLANT
PACK ARTHROSCOPY DSU (CUSTOM PROCEDURE TRAY) ×3 IMPLANT
PACK BASIN DAY SURGERY FS (CUSTOM PROCEDURE TRAY) ×3 IMPLANT
PASSER SUT SWANSON 36MM LOOP (INSTRUMENTS) IMPLANT
PENCIL BUTTON HOLSTER BLD 10FT (ELECTRODE) ×3 IMPLANT
SET ARTHROSCOPY TUBING (MISCELLANEOUS) ×3
SET ARTHROSCOPY TUBING LN (MISCELLANEOUS) ×1 IMPLANT
SLEEVE SCD COMPRESS KNEE MED (MISCELLANEOUS) IMPLANT
SLING ARM IMMOBILIZER LRG (SOFTGOODS) IMPLANT
SLING ARM IMMOBILIZER MED (SOFTGOODS) IMPLANT
SLING ARM LRG ADULT FOAM STRAP (SOFTGOODS) IMPLANT
SLING ARM MED ADULT FOAM STRAP (SOFTGOODS) IMPLANT
SLING ARM XL FOAM STRAP (SOFTGOODS) IMPLANT
SPONGE LAP 4X18 X RAY DECT (DISPOSABLE) IMPLANT
STRIP CLOSURE SKIN 1/2X4 (GAUZE/BANDAGES/DRESSINGS) IMPLANT
SUCTION FRAZIER TIP 10 FR DISP (SUCTIONS) IMPLANT
SUT ETHIBOND 2 OS 4 DA (SUTURE) IMPLANT
SUT ETHILON 2 0 FS 18 (SUTURE) IMPLANT
SUT ETHILON 3 0 PS 1 (SUTURE) IMPLANT
SUT FIBERWIRE #2 38 T-5 BLUE (SUTURE)
SUT RETRIEVER MED (INSTRUMENTS) IMPLANT
SUT TIGER TAPE 7 IN WHITE (SUTURE) ×3 IMPLANT
SUT VIC AB 0 CT1 27 (SUTURE)
SUT VIC AB 0 CT1 27XBRD ANBCTR (SUTURE) IMPLANT
SUT VIC AB 2-0 SH 27 (SUTURE)
SUT VIC AB 2-0 SH 27XBRD (SUTURE) IMPLANT
SUT VIC AB 3-0 FS2 27 (SUTURE) IMPLANT
SUTURE FIBERWR #2 38 T-5 BLUE (SUTURE) IMPLANT
TAPE FIBER 2MM 7IN #2 BLUE (SUTURE) ×3 IMPLANT
TOWEL OR 17X24 6PK STRL BLUE (TOWEL DISPOSABLE) ×3 IMPLANT
TOWEL OR NON WOVEN STRL DISP B (DISPOSABLE) ×3 IMPLANT
WATER STERILE IRR 1000ML POUR (IV SOLUTION) ×3 IMPLANT
YANKAUER SUCT BULB TIP NO VENT (SUCTIONS) IMPLANT

## 2014-12-19 NOTE — Interval H&P Note (Signed)
History and Physical Interval Note:  12/19/2014 7:29 AM  [name] [name]  has presented today for surgery, with the diagnosis of RIGHT SHOULDER ARTHROSCOPY DEBRIDMENT EXTENTSIVE,ARTHROSCOPY SHOULDER WITH ROTATOR CUFF REPAIR,INCISION REMOVAL OF SUBDELTOID CLCIUM DEPOSIT OPEN  The various methods of treatment have been discussed with the patient and family. After consideration of risks, benefits and other options for treatment, the patient has consented to  Procedure(s): RIGHT SHOULDER ARTHROSCOPY WITH ROTATOR CUFF REPAIR,CALCIFIC DEBRIDEMENT (Right) as a surgical intervention .  The patient's history has been reviewed, patient examined, no change in status, stable for surgery.  I have reviewed the patient's chart and labs.  Questions were answered to the patient's satisfaction.     Loreta Ave

## 2014-12-19 NOTE — Discharge Instructions (Signed)
Shouder arthroscopy, rotator cuff repair, subacromial decompression °Care After Instructions °Refer to this sheet in the next few weeks. These discharge instructions provide you with general information on caring for yourself after you leave the hospital. Your caregiver may also give you specific instructions. Your treatment has been planned according to the most current medical practices available, but unavoidable complications sometimes occur. If you have any problems or questions after discharge, please call your caregiver. °HOME INSTRUCTIONS °You may resume a normal diet and activities as directed.  °Take showers instead of baths until informed otherwise.  °Change bandages (dressings) in 3 days.  Swab wounds daily with betadine.  Wash shoulder with soap and water.  Pat dry.  Cover wounds with bandaids. °Only take over-the-counter or prescription medicines for pain, discomfort, or fever as directed by your caregiver.  °Wear your sling for the next 6 weeks unless otherwise instructed. °Eat a well-balanced diet.  °Avoid lifting or driving until you are instructed otherwise.  °Make an appointment to see your caregiver for stitches (suture) or staple removal one week after surgery.  ° °SEEK MEDICAL CARE IF: °You have swelling of your calf or leg.  °You develop shortness of breath or chest pain.  °You have redness, swelling, or increasing pain in the wound.  °There is pus or any unusual drainage coming from the surgical site.  °You notice a bad smell coming from the surgical site or dressing.  °The surgical site breaks open after sutures or staples have been removed.  °There is persistent bleeding from the suture or staple line.  °You are getting worse or are not improving.  °You have any other questions or concerns.  °SEEK IMMEDIATE MEDICAL CARE IF:  °You have a fever greater than 101 °You develop a rash.  °You have difficulty breathing.  °You develop any reaction or side effects to medicines given.  °Your knee  motion is decreasing rather than improving.  °MAKE SURE YOU:  °Understand these instructions.  °Will watch your condition.  °Will get help right away if you are not doing well or get worse.  ° ° ° °Regional Anesthesia Blocks ° °1. Numbness or the inability to move the "blocked" extremity may last from 3-48 hours after placement. The length of time depends on the medication injected and your individual response to the medication. If the numbness is not going away after 48 hours, call your surgeon. ° °2. The extremity that is blocked will need to be protected until the numbness is gone and the  Strength has returned. Because you cannot feel it, you will need to take extra care to avoid injury. Because it may be weak, you may have difficulty moving it or using it. You may not know what position it is in without looking at it while the block is in effect. ° °3. For blocks in the legs and feet, returning to weight bearing and walking needs to be done carefully. You will need to wait until the numbness is entirely gone and the strength has returned. You should be able to move your leg and foot normally before you try and bear weight or walk. You will need someone to be with you when you first try to ensure you do not fall and possibly risk injury. ° °4. Bruising and tenderness at the needle site are common side effects and will resolve in a few days. ° °5. Persistent numbness or new problems with movement should be communicated to the surgeon or the [facility] (  [phone])/ [facility] ([phone]). ° ° ° ° ° ° °Post Anesthesia Home Care Instructions ° °Activity: °Get plenty of rest for the remainder of the day. A responsible adult should stay with you for 24 hours following the procedure.  °For the next 24 hours, DO NOT: °-Drive a car °-Operate machinery °-Drink alcoholic beverages °-Take any medication unless instructed by your physician °-Make any legal decisions or sign important  papers. ° °Meals: °Start with liquid foods such as gelatin or soup. Progress to regular foods as tolerated. Avoid greasy, spicy, heavy foods. If nausea and/or vomiting occur, drink only clear liquids until the nausea and/or vomiting subsides. Call your physician if vomiting continues. ° °Special Instructions/Symptoms: °Your throat may feel dry or sore from the anesthesia or the breathing tube placed in your throat during surgery. If this causes discomfort, gargle with warm salt water. The discomfort should disappear within 24 hours. ° °If you had a scopolamine patch placed behind your ear for the management of post- operative nausea and/or vomiting: ° °1. The medication in the patch is effective for 72 hours, after which it should be removed.  Wrap patch in a tissue and discard in the trash. Wash hands thoroughly with soap and water. °2. You may remove the patch earlier than 72 hours if you experience unpleasant side effects which may include dry mouth, dizziness or visual disturbances. °3. Avoid touching the patch. Wash your hands with soap and water after contact with the patch. °  ° °

## 2014-12-19 NOTE — Anesthesia Postprocedure Evaluation (Signed)
  Anesthesia Post-op Note  Patient: [name] [name]  Procedure(s) Performed: Procedure(s): RIGHT SHOULDER ARTHROSCOPY WITH ROTATOR CUFF REPAIR,CALCIFIC DEBRIDEMENT EXTENSIVE (Right)  Patient Location: PACU  Anesthesia Type:GA combined with regional for post-op pain  Level of Consciousness: awake  Airway and Oxygen Therapy: Patient Spontanous Breathing  Post-op Pain: none  Post-op Assessment: Post-op Vital signs reviewed, Patient's Cardiovascular Status Stable, Respiratory Function Stable, Patent Airway, No signs of Nausea or vomiting and Pain level controlled              Post-op Vital Signs: Reviewed and stable  Last Vitals:  Filed Vitals:   12/19/14 1411  BP: 155/73  Pulse: 80  Temp: 36.6 C  Resp: 16    Complications: No apparent anesthesia complications

## 2014-12-19 NOTE — Anesthesia Procedure Notes (Addendum)
Anesthesia Regional Block:  Interscalene brachial plexus block  Pre-Anesthetic Checklist: ,, timeout performed, Correct Patient, Correct Site, Correct Laterality, Correct Procedure, Correct Position, site marked, Risks and benefits discussed,  Surgical consent,  Pre-op evaluation,  At surgeon's request and post-op pain management  Laterality: Upper and Right  Prep: chloraprep       Needles:  Injection technique: Single-shot  Needle Type: Echogenic Stimulator Needle          Additional Needles:  Procedures: ultrasound guided (picture in chart) and nerve stimulator Interscalene brachial plexus block  Nerve Stimulator or Paresthesia:  Response: deltoid, 0.5 mA,   Additional Responses:   Narrative:  Injection made incrementally with aspirations every 5 mL.  Performed by: Personally  Anesthesiologist: MOSER, CHRISTOPHER  Additional Notes: H+P and labs reviewed, risks and benefits discussed with patient, procedure tolerated well without complications   Procedure Name: Intubation Performed by: York Grice Pre-anesthesia Checklist: Patient identified, Timeout performed, Emergency Drugs available, Suction available and Patient being monitored Patient Re-evaluated:Patient Re-evaluated prior to inductionOxygen Delivery Method: Circle system utilized Preoxygenation: Pre-oxygenation with 100% oxygen Intubation Type: IV induction Ventilation: Mask ventilation without difficulty Laryngoscope Size: Miller and 2 Grade View: Grade I Tube type: Oral Tube size: 7.0 mm Number of attempts: 1 Airway Equipment and Method: Stylet Placement Confirmation: ETT inserted through vocal cords under direct vision,  positive ETCO2 and breath sounds checked- equal and bilateral Secured at: 22 cm Tube secured with: Tape Dental Injury: Teeth and Oropharynx as per pre-operative assessment

## 2014-12-19 NOTE — Anesthesia Preprocedure Evaluation (Signed)
Anesthesia Evaluation  Patient identified by MRN, date of birth, ID band Patient awake    Reviewed: Allergy & Precautions, NPO status , Patient's Chart, lab work & pertinent test results  History of Anesthesia Complications Negative for: history of anesthetic complications  Airway Mallampati: II  TM Distance: >3 FB Neck ROM: Full    Dental  (+) Teeth Intact   Pulmonary neg pulmonary ROS,    breath sounds clear to auscultation       Cardiovascular hypertension, Pt. on medications (-) angina(-) Past MI and (-) CHF (-) dysrhythmias  Rhythm:Irregular     Neuro/Psych negative neurological ROS  negative psych ROS   GI/Hepatic negative GI ROS, Neg liver ROS,   Endo/Other  negative endocrine ROS  Renal/GU negative Renal ROS     Musculoskeletal  (+) Arthritis ,   Abdominal   Peds  Hematology negative hematology ROS (+)   Anesthesia Other Findings   Reproductive/Obstetrics                            Anesthesia Physical  Anesthesia Plan  ASA: II  Anesthesia Plan: General and Regional   Post-op Pain Management: GA combined w/ Regional for post-op pain   Induction: Intravenous  Airway Management Planned: Oral ETT  Additional Equipment: None  Intra-op Plan:   Post-operative Plan: Extubation in OR  Informed Consent: I have reviewed the patients History and Physical, chart, labs and discussed the procedure including the risks, benefits and alternatives for the proposed anesthesia with the patient or authorized representative who has indicated his/her understanding and acceptance.   Dental advisory given  Plan Discussed with: Surgeon and CRNA  Anesthesia Plan Comments:         Anesthesia Quick Evaluation  

## 2014-12-19 NOTE — Transfer of Care (Signed)
Immediate Anesthesia Transfer of Care Note  Patient: [name] [name]  Procedure(s) Performed: Procedure(s): RIGHT SHOULDER ARTHROSCOPY WITH ROTATOR CUFF REPAIR,CALCIFIC DEBRIDEMENT EXTENSIVE (Right)  Patient Location: PACU  Anesthesia Type:GA combined with regional for post-op pain  Level of Consciousness: awake and patient cooperative  Airway & Oxygen Therapy: Patient Spontanous Breathing and Patient connected to face mask oxygen  Post-op Assessment: Report given to RN and Post -op Vital signs reviewed and stable  Post vital signs: Reviewed and stable  Last Vitals:  Filed Vitals:   12/19/14 1217  BP:   Pulse: 93  Temp:   Resp:     Complications: No apparent anesthesia complications

## 2014-12-19 NOTE — Progress Notes (Signed)
AssistedDr. Moser with right, ultrasound guided, interscalene  block. Side rails up, monitors on throughout procedure. See vital signs in flow sheet. Tolerated Procedure well.  

## 2014-12-20 ENCOUNTER — Encounter (HOSPITAL_BASED_OUTPATIENT_CLINIC_OR_DEPARTMENT_OTHER): Payer: Self-pay | Admitting: Orthopedic Surgery

## 2014-12-20 LAB — POCT HEMOGLOBIN-HEMACUE: Hemoglobin: 13.9 g/dL (ref 12.0–15.0)

## 2014-12-20 NOTE — Op Note (Signed)
[name], [name] NO.:  [id number]  MEDICAL RECORD NO.:  [id number]  LOCATION:                               FACILITY:  [facility]  PHYSICIAN:  Loreta Ave, M.D. DATE OF BIRTH:  [birth year]  DATE OF PROCEDURE:  12/19/2014 DATE OF DISCHARGE:  12/19/2014                              OPERATIVE REPORT   PREOPERATIVE DIAGNOSES:  Right shoulder previous arthroscopic decompression.  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  POSTOPERATIVE DIAGNOSES:  Right shoulder previous arthroscopic decompression.  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  Adequate previous acromioplasty decompression, biceps release.  PROCEDURES:  Right shoulder exam under anesthesia, arthroscopy. Debridement of calcific deposits, subscapularis, supraspinatus tendon above and below.  Debridement and repair of supraspinatus tendon, FiberWeave suture x2, SwiveLock anchors x2.  SURGEON:  Loreta Ave, M.D.  ASSISTANT:  Mikey Kirschner, P.A., present throughout the entire case and necessary for timely completion of procedure.  ANESTHESIA:  General.  BLOOD LOSS:  Minimal.  SPECIMENS:  None.  CULTURES:  None.  COMPLICATIONS:  None.  DRESSINGS:  Soft compressive with shoulder immobilizer.  DESCRIPTION OF PROCEDURE:  The patient was brought to the operating room and placed on the operating table in supine position.  After adequate anesthesia had been obtained, placed in beach-chair position on shoulder positioner, prepped and draped in usual sterile fashion.  Full motion with stable shoulder.  Three portals; anterior, posterior and lateral. Arthroscope was introduced, shoulder was distended and inspected.  There was some  crystalline deposits from cortisone injection in the glenohumeral joint, debrided.  Articular cartilage looked reasonable. Previous biceps release without changes.  Separate calcific deposits in the subscapularis, supraspinatus tendon and debrided throughout.  Marked thinning of the supraspinatus throughout the crescent region.  Cannula redirected subacromially.  Adequacy of bony decompression confirmed.  I then further debrided out all the calcific deposits in the subscapularis and supraspinatus.  Both was left in the crescent region, was paper thin, functional full-thickness tear.  This was debrided, mobilized. Debrided back to healthy tissue.  Tuberosity roughened.  With the cannula laterally, the cuff was captured with two horizontal mattress sutures utilizing Arthrex Scorpion device.  This was then firmly anchored down into the prepared tuberosity with two SwiveLock anchors. At completion, adequacy of decompression, cuff debridement, cuff repair confirmed viewing from all portals.  Instruments and fluids were removed.  Portals were closed with nylon.  Sterile compressive dressing applied.  Shoulder immobilizer applied.  Anesthesia reversed.  Brought to the recovery room.  Tolerated the surgery well.  No complications.     Loreta Ave, M.D.     DFM/MEDQ  D:  12/19/2014  T:  12/20/2014  Job:  [id number]

## 2015-01-01 ENCOUNTER — Encounter: Payer: Self-pay | Admitting: Physical Therapy

## 2015-01-01 ENCOUNTER — Ambulatory Visit: Payer: Medicare Other | Attending: Orthopedic Surgery | Admitting: Physical Therapy

## 2015-01-01 DIAGNOSIS — M25611 Stiffness of right shoulder, not elsewhere classified: Secondary | ICD-10-CM | POA: Diagnosis present

## 2015-01-01 DIAGNOSIS — M25511 Pain in right shoulder: Secondary | ICD-10-CM | POA: Insufficient documentation

## 2015-01-01 DIAGNOSIS — R29898 Other symptoms and signs involving the musculoskeletal system: Secondary | ICD-10-CM | POA: Insufficient documentation

## 2015-01-01 NOTE — Therapy (Signed)
[facility] Outpatient Rehabilitation [facility] [address]  [address] [place], [place], [address] Phone: [phone]   Fax:  [phone]  Physical Therapy Evaluation  Patient Details  Name: [name] [name] MRN: [id number] Date of Birth: 1945-12-10 Referring Provider:  Loreta Ave, MD  Encounter Date: 01/01/2015      PT End of Session - 01/01/15 1105    Visit Number 1   Number of Visits 20   Date for PT Re-Evaluation 03/12/15   PT Start Time 1104   PT Stop Time 1144   PT Time Calculation (min) 40 min      Past Medical History  Diagnosis Date  . Allergy   . Hypertension   . Hyperlipidemia   . IBS (irritable bowel syndrome)   . Arthritis   . Full dentures   . Wears glasses     readers  . Asthma     no inhalers  . PONV (postoperative nausea and vomiting)     Past Surgical History  Procedure Laterality Date  . Shoulder surgery      left  . Elbow surgery    . Knee surgery      lt  . Carpal tunnel release      rt  . Varicose vein surgery    . Nasal septum surgery    . Abdominal hysterectomy    . Cholecystectomy    . Appendectomy    . Tonsillectomy and adenoidectomy    . Breast lumpectomy  1985    lt  . Eye surgery      both cataracts  . Wrist arthroscopy Right 09/20/2013    Procedure: RIGHT ARTHROSCOPY WRIST EXCISION PISIFORM;  Surgeon: Wyn Forster, MD;  Location: [facility];  Service: Orthopedics;  Laterality: Right;  . Shoulder arthroscopy with bicepstenotomy Right 05/09/2014    Procedure: SHOULDER ARTHROSCOPY WITH BICEPSTENOTOMY;  Surgeon: Loreta Ave, MD;  Location: [facility];  Service: Orthopedics;  Laterality: Right;  Debridement Rotator Cuff Tear  . Shoulder acromioplasty Right 05/09/2014    Procedure: SHOULDER ACROMIOPLASTY;  Surgeon: Loreta Ave, MD;  Location: [facility];  Service: Orthopedics;  Laterality: Right;  . Shoulder arthroscopy with rotator cuff repair Right  12/19/2014    Procedure: RIGHT SHOULDER ARTHROSCOPY WITH ROTATOR CUFF REPAIR,CALCIFIC DEBRIDEMENT EXTENSIVE;  Surgeon: Loreta Ave, MD;  Location: [facility];  Service: Orthopedics;  Laterality: Right;    There were no vitals filed for this visit.  Visit Diagnosis:  Pain in joint, shoulder region, right - Plan: PT plan of care cert/re-cert  Shoulder weakness - Plan: PT plan of care cert/re-cert  Shoulder stiffness, right - Plan: PT plan of care cert/re-cert      Subjective Assessment - 01/01/15 1111    Subjective Pt s/p R shoulder RCR on 12/19/14.  She typically notes throbbing, dull pain throughout the day rating 6-7/10 and notes short duration sharp pain up to 10/10 with accidental.  She states surgery was more extensive than surgeon had initially thought.  She is currently wearing shoudler sling with adduction pillow 24/7 other than for bathing.  She is allowed to remove adduction pillow this Friday (2 days) but will continue with sling for total of 6 weeks.  Pt with cc of difficulty sleeping.  She states she wakes every 1-2 hours due to shoulder pain and states only get around 5 hours of "broken" sleep a night.   Patient Stated Goals "get back  to normal"   Currently in Pain? Yes   Pain Score --  R Shoulder AVG pain 6-7/10 and worst pain up to 10/10   Pain Location Shoulder   Pain Orientation Right   Pain Descriptors / Indicators Dull;Aching;Throbbing;Sharp   Pain Frequency Constant   Aggravating Factors  accidental movement   Pain Relieving Factors medication; sling; rest            [facility] PT Assessment - 01/01/15 0001    Assessment   Medical Diagnosis s/p R RCR   Onset Date/Surgical Date 12/19/14   Hand Dominance Right   Next MD Visit 01/31/15   Precautions   Precautions --   Balance Screen   Has the patient fallen in the past 6 months No   Has the patient had a decrease in activity level because of a fear of falling?  No   Is the patient reluctant to  leave their home because of a fear of falling?  No   ROM / Strength   AROM / PROM / Strength PROM   PROM   PROM Assessment Site Shoulder   Right/Left Shoulder Right   Right Shoulder Flexion 80 Degrees   Right Shoulder ABduction 45 Degrees   Right Shoulder Internal Rotation --  to abdomen in 30 ABD   Right Shoulder External Rotation 10 Degrees  at 30 ABD          TODAY'S TREATMENT Manual - R GH grade 2 AP and caudal glides along with gentle PROM all planes per RCR protocol                 PT Education - 01/01/15 1159    Education provided Yes   Education Details wrist and hand movements/HEP in and out of sling   Person(s) Educated Patient   Methods Explanation;Demonstration   Comprehension Verbalized understanding          PT Short Term Goals - 01/01/15 1206    PT SHORT TERM GOAL #1   Title pt independent with initial HEP by 01/10/15   Status New   PT SHORT TERM GOAL #2   Title R Shoulder PROM Flexion to 150, ABD to 120, ER to 60 at 80 ABD by 02/22/15   Status New           PT Long Term Goals - 01/01/15 1208    PT LONG TERM GOAL #1   Title independent with advanced HEP as necessary by 03/12/15   Status New   PT LONG TERM GOAL #2   Title R shoulder AROM WFL and MMT 4/5 or better all planes by 03/12/15   Status New   PT LONG TERM GOAL #3   Title pt able to perform all ADLs and chores without limitation by R shoulder pain or weakness by 03/12/15   Status New   PT LONG TERM GOAL #4   Title pt able to return recreational activities without restriction by R shoulder pain or weakness by 03/12/15   Status New               Plan - 01/01/15 1155    Clinical Impression Statement pt is 2 wks s/p R RCR.  She states she notes constant pain to R shoulder which she rates 6/10 on average and states can get up to 10/10 for durations with accidental movement/use of R Shoulder.  She tolerated gentle manual therapy and PROM quite well today displaying a 25  degree improvement in Flexion PROM from start  to end of treatment (initiall was limited to 55dg due to pain but progressed to 80 after several minutes of manual).  Other PROM values are ABD to 45, ER to 10 at 30 ABD, and IR to abdomen at 30 ABD.  Pt's chief complaint at this time is difficulty wtih sleeping due to pain.  She states she likely sleeps 5 hours/nigh but wakes every 1-2 hours due to shoulder pain.  Pt was advised to use meds more for better control of pain as she is reluctant to take pain medications.  Ms. Sweatt is familiar to me as I have worked with her in the past with other issues and I am confident she will progress quite well.  We discussed importance of taking progressions slowly.   Pt will benefit from skilled therapeutic intervention in order to improve on the following deficits Pain;Decreased strength;Decreased mobility;Decreased range of motion;Impaired UE functional use   Rehab Potential Good   PT Frequency 2x / week   PT Duration --  10 wks   PT Treatment/Interventions Manual techniques;Vasopneumatic Device;Taping;Passive range of motion;Patient/family education;Therapeutic exercise;Therapeutic activities;Cryotherapy;Electrical Stimulation   PT Next Visit Plan add pendulums and scapular movements to HEP; R Shoulder manual and PROM within wk 2 protocol limits (ABD 70, flexion no limits, ER to 30 at 30 ABD)   Consulted and Agree with Plan of Care Patient          G-Codes - 01-19-2015 1151    Functional Assessment Tool Used FOTO 93% limitation   Functional Limitation Carrying, moving and handling objects   Carrying, Moving and Handling Objects Current Status (Z6109) At least 80 percent but less than 100 percent impaired, limited or restricted   Carrying, Moving and Handling Objects Goal Status (U0454) At least 40 percent but less than 60 percent impaired, limited or restricted       Problem List Patient Active Problem List   Diagnosis Date Noted  . Chest pain 05/09/2013   . Hypertension 02/23/2012  . Hyperlipidemia 02/23/2012  . Right shoulder pain 01/25/2012  . Left wrist pain 01/16/2012  . Neck pain 12/26/2011    HALL,RALPH PT, OCS 2015/01/19, 12:13 PM  [facility] [address]  [address] [place], [place], [address] Phone: [phone]   Fax:  [phone]

## 2015-01-07 ENCOUNTER — Ambulatory Visit: Payer: Medicare Other | Admitting: Rehabilitation

## 2015-01-07 DIAGNOSIS — M25511 Pain in right shoulder: Secondary | ICD-10-CM

## 2015-01-07 DIAGNOSIS — R29898 Other symptoms and signs involving the musculoskeletal system: Secondary | ICD-10-CM

## 2015-01-07 DIAGNOSIS — M25611 Stiffness of right shoulder, not elsewhere classified: Secondary | ICD-10-CM

## 2015-01-07 NOTE — Therapy (Signed)
[facility] Outpatient Rehabilitation [facility] [address]  [address] [place], [place], [address] Phone: [phone]   Fax:  [phone]  Physical Therapy Treatment  Patient Details  Name: [name] [name] MRN: [id number] Date of Birth: 06/08/1945 Referring Provider:  Loreta Ave, MD  Encounter Date: 01/07/2015      PT End of Session - 01/07/15 1019    Visit Number 2   Number of Visits 20   Date for PT Re-Evaluation 03/12/15   PT Start Time 1016   PT Stop Time 1110   PT Time Calculation (min) 54 min      Past Medical History  Diagnosis Date  . Allergy   . Hypertension   . Hyperlipidemia   . IBS (irritable bowel syndrome)   . Arthritis   . Full dentures   . Wears glasses     readers  . Asthma     no inhalers  . PONV (postoperative nausea and vomiting)     Past Surgical History  Procedure Laterality Date  . Shoulder surgery      left  . Elbow surgery    . Knee surgery      lt  . Carpal tunnel release      rt  . Varicose vein surgery    . Nasal septum surgery    . Abdominal hysterectomy    . Cholecystectomy    . Appendectomy    . Tonsillectomy and adenoidectomy    . Breast lumpectomy  1985    lt  . Eye surgery      both cataracts  . Wrist arthroscopy Right 09/20/2013    Procedure: RIGHT ARTHROSCOPY WRIST EXCISION PISIFORM;  Surgeon: Wyn Forster, MD;  Location: [facility];  Service: Orthopedics;  Laterality: Right;  . Shoulder arthroscopy with bicepstenotomy Right 05/09/2014    Procedure: SHOULDER ARTHROSCOPY WITH BICEPSTENOTOMY;  Surgeon: Loreta Ave, MD;  Location: [facility];  Service: Orthopedics;  Laterality: Right;  Debridement Rotator Cuff Tear  . Shoulder acromioplasty Right 05/09/2014    Procedure: SHOULDER ACROMIOPLASTY;  Surgeon: Loreta Ave, MD;  Location: [facility];  Service: Orthopedics;  Laterality: Right;  . Shoulder arthroscopy with rotator cuff repair Right  12/19/2014    Procedure: RIGHT SHOULDER ARTHROSCOPY WITH ROTATOR CUFF REPAIR,CALCIFIC DEBRIDEMENT EXTENSIVE;  Surgeon: Loreta Ave, MD;  Location:  [facility]ERY [facility];  Service: Orthopedics;  Laterality: Right;    There were no vitals filed for this visit.  Visit Diagnosis:  Pain in joint, shoulder region, right  Shoulder weakness  Shoulder stiffness, right      Subjective Assessment - 01/07/15 1018    Subjective Reports taking the pillow off of the sling Friday and had intense pain. She called the MD office to check and make sure she was supposed to keep it off.    Currently in Pain? Yes   Pain Score 7    Pain Location Shoulder   Pain Orientation Right   Pain Descriptors / Indicators Aching;Dull;Sharp      TODAY'S TREATMENT Manual - R GH grade 2 AP and caudal glides along with gentle PROM all planes per RCR protocol and to pt tolerance Gentle distraction with oscillations to reduce pain/guarding  Gentle STM to Rt pec/lateral shoulder  TherEx - Pendulums (fwd/bwd, side/side, circles) Scapular squeezes 10x3"  Vasopneumatic low, 38 degrees, 15 minutes, to Rt shoulder            PT Short Term  Goals - 01/07/15 1020    PT SHORT TERM GOAL #1   Title pt independent with initial HEP by 01/10/15   Status On-going   PT SHORT TERM GOAL #2   Title R Shoulder PROM Flexion to 150, ABD to 120, ER to 60 at 80 ABD by 02/22/15   Status On-going           PT Long Term Goals - 01/07/15 1020    PT LONG TERM GOAL #1   Title (p) independent with advanced HEP as necessary by 03/12/15   Status (p) On-going   PT LONG TERM GOAL #2   Title (p) R shoulder AROM WFL and MMT 4/5 or better all planes by 03/12/15   Status (p) On-going   PT LONG TERM GOAL #3   Title (p) pt able to perform all ADLs and chores without limitation by R shoulder pain or weakness by 03/12/15   Status (p) On-going   PT LONG TERM GOAL #4   Title (p) pt able to return recreational activities without  restriction by R shoulder pain or weakness by 03/12/15   Status (p) On-going   PT LONG TERM GOAL #5   Title (p) pt demonstate/verbalize info on physical activity to reduce risk of re-injury by 07/16/14   Status (p) On-going               Plan - 01/07/15 1055    Clinical Impression Statement Very tight and tender with PROM but pt able to relax more after manual work. Did perform gentle STM to Rt pec and lateral shoulder with most tenderness noted along lateral incision.   PT Next Visit Plan R Shoulder manual and PROM within wk 2 protocol limits (ABD 70, flexion no limits, ER to 30 at 30 ABD)   Consulted and Agree with Plan of Care Patient        Problem List Patient Active Problem List   Diagnosis Date Noted  . Chest pain 05/09/2013  . Hypertension 02/23/2012  . Hyperlipidemia 02/23/2012  . Right shoulder pain 01/25/2012  . Left wrist pain 01/16/2012  . Neck pain 12/26/2011    Ronney Lion, PTA 01/07/2015, 11:03 AM  [facility] [address]  [address] [place], [place], [address] Phone: [phone]   Fax:  [phone]

## 2015-01-09 ENCOUNTER — Ambulatory Visit: Payer: Medicare Other | Admitting: Physical Therapy

## 2015-01-09 DIAGNOSIS — M25511 Pain in right shoulder: Secondary | ICD-10-CM

## 2015-01-09 DIAGNOSIS — R29898 Other symptoms and signs involving the musculoskeletal system: Secondary | ICD-10-CM

## 2015-01-09 DIAGNOSIS — M25611 Stiffness of right shoulder, not elsewhere classified: Secondary | ICD-10-CM

## 2015-01-09 NOTE — Therapy (Signed)
[facility] Outpatient Rehabilitation [facility] [address]  [address] [place], [place], [address] Phone: [phone]   Fax:  [phone]  Physical Therapy Treatment  Patient Details  Name: [name] [name] MRN: [id number] Date of Birth: [birth year] Referring Provider:  Loreta Ave, MD  Encounter Date: 01/09/2015      PT End of Session - 01/09/15 1709    Visit Number 3   Number of Visits 20   Date for PT Re-Evaluation 03/12/15   PT Start Time 1708   PT Stop Time 1742   PT Time Calculation (min) 34 min      Past Medical History  Diagnosis Date  . Allergy   . Hypertension   . Hyperlipidemia   . IBS (irritable bowel syndrome)   . Arthritis   . Full dentures   . Wears glasses     readers  . Asthma     no inhalers  . PONV (postoperative nausea and vomiting)     Past Surgical History  Procedure Laterality Date  . Shoulder surgery      left  . Elbow surgery    . Knee surgery      lt  . Carpal tunnel release      rt  . Varicose vein surgery    . Nasal septum surgery    . Abdominal hysterectomy    . Cholecystectomy    . Appendectomy    . Tonsillectomy and adenoidectomy    . Breast lumpectomy  1985    lt  . Eye surgery      both cataracts  . Wrist arthroscopy Right 09/20/2013    Procedure: RIGHT ARTHROSCOPY WRIST EXCISION PISIFORM;  Surgeon: Wyn Forster, MD;  Location: [facility];  Service: Orthopedics;  Laterality: Right;  . Shoulder arthroscopy with bicepstenotomy Right 05/09/2014    Procedure: SHOULDER ARTHROSCOPY WITH BICEPSTENOTOMY;  Surgeon: Loreta Ave, MD;  Location: [facility];  Service: Orthopedics;  Laterality: Right;  Debridement Rotator Cuff Tear  . Shoulder acromioplasty Right 05/09/2014    Procedure: SHOULDER ACROMIOPLASTY;  Surgeon: Loreta Ave, MD;  Location: [facility];  Service: Orthopedics;  Laterality: Right;  . Shoulder arthroscopy with rotator cuff repair Right  12/19/2014    Procedure: RIGHT SHOULDER ARTHROSCOPY WITH ROTATOR CUFF REPAIR,CALCIFIC DEBRIDEMENT EXTENSIVE;  Surgeon: Loreta Ave, MD;  Location: [facility];  Service: Orthopedics;  Laterality: Right;    There were no vitals filed for this visit.  Visit Diagnosis:  Pain in joint, shoulder region, right  Shoulder weakness  Shoulder stiffness, right  Limitation of joint motion of right shoulder      Subjective Assessment - 01/09/15 1710    Subjective states pain tends to increase throughout the day and is currently 8/10.  Still not sleeping well.   Currently in Pain? Yes   Pain Score 8    Pain Location Shoulder   Pain Orientation Right            [facility] PT Assessment - 01/09/15 0001    PROM   Right Shoulder Flexion 100 Degrees   Right Shoulder ABduction 62 Degrees   Right Shoulder External Rotation 25 Degrees  30 ABD          TODAY'S TREATMENT Manual - R GH Grade 2 AP and Caudal glides; TPR R pectorals; gentle GH distraction with PROM into ABD, ER, IR, and Flexion (all values improved). Reviewed and practiced scap retraction isometric as  part of HEP.  No vaso today, another pt on unit                     PT Short Term Goals - 01/09/15 1748    PT SHORT TERM GOAL #1   Title pt independent with initial HEP by 01/10/15   Status Achieved   PT SHORT TERM GOAL #2   Title R Shoulder PROM Flexion to 150, ABD to 120, ER to 60 at 80 ABD by 02/22/15   Status On-going           PT Long Term Goals - 01/09/15 1747    PT LONG TERM GOAL #1   Title independent with advanced HEP as necessary by 03/12/15   Status On-going   PT LONG TERM GOAL #2   Title R shoulder AROM WFL and MMT 4/5 or better all planes by 03/12/15   Status On-going   PT LONG TERM GOAL #3   Title pt able to perform all ADLs and chores without limitation by R shoulder pain or weakness by 03/12/15   Status On-going   PT LONG TERM GOAL #4   Title pt able to return  recreational activities without restriction by R shoulder pain or weakness by 03/12/15   Status On-going               Plan - 01/09/15 1748    Clinical Impression Statement Good improvment with PROM today but still very painful and pt still reluctant to take pain medication.  Not sleeping well due to pain.  R pectoral responded well to TPR today.   PT Next Visit Plan R Shoulder manual and PROM within wk 3 protocol limits (ABD 90, flexion no limits, ER to 45 at 45 ABD); begin pulleys and wand if able   Consulted and Agree with Plan of Care Patient        Problem List Patient Active Problem List   Diagnosis Date Noted  . Chest pain 05/09/2013  . Hypertension 02/23/2012  . Hyperlipidemia 02/23/2012  . Right shoulder pain 01/25/2012  . Left wrist pain 01/16/2012  . Neck pain 12/26/2011    HALL,RALPH PT, OCS 01/09/2015, 5:54 PM  [facility] [address]  [address] [place], [place], [address] Phone: [phone]   Fax:  [phone]

## 2015-01-14 ENCOUNTER — Ambulatory Visit: Payer: Medicare Other | Attending: Orthopedic Surgery | Admitting: Physical Therapy

## 2015-01-14 DIAGNOSIS — M25611 Stiffness of right shoulder, not elsewhere classified: Secondary | ICD-10-CM | POA: Insufficient documentation

## 2015-01-14 DIAGNOSIS — M25511 Pain in right shoulder: Secondary | ICD-10-CM | POA: Diagnosis not present

## 2015-01-14 DIAGNOSIS — R29898 Other symptoms and signs involving the musculoskeletal system: Secondary | ICD-10-CM | POA: Diagnosis present

## 2015-01-14 NOTE — Therapy (Signed)
[facility] [address]  [address] [place], [place], [address] Phone: [phone]   Fax:  [phone]  Physical Therapy Treatment  Patient Details  Name: [name] [name] MRN: [id number] Date of Birth: 1946-01-20 Referring Provider:  Loreta Ave, MD  Encounter Date: 01/14/2015      PT End of Session - 01/14/15 0941    Visit Number 4   Number of Visits 20   Date for PT Re-Evaluation 03/12/15   PT Start Time 0939   PT Stop Time 1025   PT Time Calculation (min) 46 min      Past Medical History  Diagnosis Date  . Allergy   . Hypertension   . Hyperlipidemia   . IBS (irritable bowel syndrome)   . Arthritis   . Full dentures   . Wears glasses     readers  . Asthma     no inhalers  . PONV (postoperative nausea and vomiting)     Past Surgical History  Procedure Laterality Date  . Shoulder surgery      left  . Elbow surgery    . Knee surgery      lt  . Carpal tunnel release      rt  . Varicose vein surgery    . Nasal septum surgery    . Abdominal hysterectomy    . Cholecystectomy    . Appendectomy    . Tonsillectomy and adenoidectomy    . Breast lumpectomy  1985    lt  . Eye surgery      both cataracts  . Wrist arthroscopy Right 09/20/2013    Procedure: RIGHT ARTHROSCOPY WRIST EXCISION PISIFORM;  Surgeon: Wyn Forster, MD;  Location: [facility];  Service: Orthopedics;  Laterality: Right;  . Shoulder arthroscopy with bicepstenotomy Right 05/09/2014    Procedure: SHOULDER ARTHROSCOPY WITH BICEPSTENOTOMY;  Surgeon: Loreta Ave, MD;  Location: [facility];  Service: Orthopedics;  Laterality: Right;  Debridement Rotator Cuff Tear  . Shoulder acromioplasty Right 05/09/2014    Procedure: SHOULDER ACROMIOPLASTY;  Surgeon: Loreta Ave, MD;  Location: [facility];  Service: Orthopedics;  Laterality: Right;  . Shoulder arthroscopy with rotator cuff repair Right  12/19/2014    Procedure: RIGHT SHOULDER ARTHROSCOPY WITH ROTATOR CUFF REPAIR,CALCIFIC DEBRIDEMENT EXTENSIVE;  Surgeon: Loreta Ave, MD;  Location: [facility];  Service: Orthopedics;  Laterality: Right;    There were no vitals filed for this visit.  Visit Diagnosis:  Pain in joint, shoulder region, right  Shoulder weakness  Shoulder stiffness, right  Limitation of joint motion of right shoulder      Subjective Assessment - 01/14/15 1011    Subjective is performing HEP but states still having difficulty relaxing shoulder.  Pain is decreasing with pain at 5-6/10 today.   Currently in Pain? Yes   Pain Score 6   5-6/10   Pain Location Shoulder   Pain Orientation Right;Anterior            [facility] PT Assessment - 01/14/15 0001    PROM   Right Shoulder Flexion 110 Degrees   Right Shoulder ABduction 77 Degrees   Right Shoulder External Rotation 37 Degrees  at 45 ABD           TODAY'S TREATMENT Manual - R GH Grade 2 AP and Caudal glides; TPR R anterior deltoid and pectorals; gentle GH distraction with PROM into ABD, ER, IR, and Flexion.  TherEx - Pulleys Scaption 15x, gentle horiz ABD/ADD with increasing scaption 1', Flexion 15x  Vasopneumatic Compression, Low Pressure, 38dg, 15' R Shoulder                  PT Short Term Goals - 01/09/15 1748    PT SHORT TERM GOAL #1   Title pt independent with initial HEP by 01/10/15   Status Achieved   PT SHORT TERM GOAL #2   Title R Shoulder PROM Flexion to 150, ABD to 120, ER to 60 at 80 ABD by 02/22/15   Status On-going           PT Long Term Goals - 01/09/15 1747    PT LONG TERM GOAL #1   Title independent with advanced HEP as necessary by 03/12/15   Status On-going   PT LONG TERM GOAL #2   Title R shoulder AROM WFL and MMT 4/5 or better all planes by 03/12/15   Status On-going   PT LONG TERM GOAL #3   Title pt able to perform all ADLs and chores without limitation by R shoulder pain or  weakness by 03/12/15   Status On-going   PT LONG TERM GOAL #4   Title pt able to return recreational activities without restriction by R shoulder pain or weakness by 03/12/15   Status On-going               Plan - 01/14/15 1013    Clinical Impression Statement in week 3 of RC protocol and good PROM improvements today.  Flexion still behind ideal at this point but is improving.  Chief limitation with Flexion is a "catch" in anterior shoulder which seems trigger point in anterior delt.  Initial performance on pulleys seemed well tolerated today but pt requires intermittent VC to limit scapular elevation.   PT Next Visit Plan R Shoulder manual and PROM within wk 3 protocol limits (ABD 90, flexion no limits, ER to 45 at 45 ABD); begin wand when able   Consulted and Agree with Plan of Care Patient        Problem List Patient Active Problem List   Diagnosis Date Noted  . Chest pain 05/09/2013  . Hypertension 02/23/2012  . Hyperlipidemia 02/23/2012  . Right shoulder pain 01/25/2012  . Left wrist pain 01/16/2012  . Neck pain 12/26/2011    Latronda Spink PT, OCS 01/14/2015, 10:28 AM  [facility] [address]  [address] [place], [place], [address] Phone: [phone]   Fax:  [phone]

## 2015-01-16 ENCOUNTER — Ambulatory Visit: Payer: Medicare Other | Admitting: Physical Therapy

## 2015-01-16 DIAGNOSIS — M25511 Pain in right shoulder: Secondary | ICD-10-CM | POA: Diagnosis not present

## 2015-01-16 DIAGNOSIS — M25611 Stiffness of right shoulder, not elsewhere classified: Secondary | ICD-10-CM

## 2015-01-16 DIAGNOSIS — R29898 Other symptoms and signs involving the musculoskeletal system: Secondary | ICD-10-CM

## 2015-01-16 NOTE — Therapy (Signed)
[facility] [address]  [address] [place], [place], [address] Phone: [phone]   Fax:  [phone]  Physical Therapy Treatment  Patient Details  Name: [name] [name] MRN: [id number] Date of Birth: 12-Aug-1945 Referring Provider:  Loreta Ave, MD  Encounter Date: 01/16/2015      PT End of Session - 01/16/15 1318    Visit Number 5   Number of Visits 20   Date for PT Re-Evaluation 03/12/15   PT Start Time 1316   PT Stop Time 1409   PT Time Calculation (min) 53 min      Past Medical History  Diagnosis Date  . Allergy   . Hypertension   . Hyperlipidemia   . IBS (irritable bowel syndrome)   . Arthritis   . Full dentures   . Wears glasses     readers  . Asthma     no inhalers  . PONV (postoperative nausea and vomiting)     Past Surgical History  Procedure Laterality Date  . Shoulder surgery      left  . Elbow surgery    . Knee surgery      lt  . Carpal tunnel release      rt  . Varicose vein surgery    . Nasal septum surgery    . Abdominal hysterectomy    . Cholecystectomy    . Appendectomy    . Tonsillectomy and adenoidectomy    . Breast lumpectomy  1985    lt  . Eye surgery      both cataracts  . Wrist arthroscopy Right 09/20/2013    Procedure: RIGHT ARTHROSCOPY WRIST EXCISION PISIFORM;  Surgeon: Wyn Forster, MD;  Location: [facility];  Service: Orthopedics;  Laterality: Right;  . Shoulder arthroscopy with bicepstenotomy Right 05/09/2014    Procedure: SHOULDER ARTHROSCOPY WITH BICEPSTENOTOMY;  Surgeon: Loreta Ave, MD;  Location: [facility];  Service: Orthopedics;  Laterality: Right;  Debridement Rotator Cuff Tear  . Shoulder acromioplasty Right 05/09/2014    Procedure: SHOULDER ACROMIOPLASTY;  Surgeon: Loreta Ave, MD;  Location: [facility];  Service: Orthopedics;  Laterality: Right;  . Shoulder arthroscopy with rotator cuff repair Right  12/19/2014    Procedure: RIGHT SHOULDER ARTHROSCOPY WITH ROTATOR CUFF REPAIR,CALCIFIC DEBRIDEMENT EXTENSIVE;  Surgeon: Loreta Ave, MD;  Location: [facility];  Service: Orthopedics;  Laterality: Right;    There were no vitals filed for this visit.  Visit Diagnosis:  Pain in joint, shoulder region, right  Shoulder weakness  Shoulder stiffness, right  Limitation of joint motion of right shoulder      Subjective Assessment - 01/16/15 1317    Subjective states shoulder pain tends to be best in AM rating 3/10 and progresses to 7-8/10 by end of day.  Currently pain is 5-6/10.   Currently in Pain? Yes   Pain Score 6   5-6/10   Pain Location Shoulder   Pain Orientation Right;Anterior            [facility] PT Assessment - 01/16/15 0001    PROM   Right Shoulder Flexion 110 Degrees   Right Shoulder ABduction 80 Degrees   Right Shoulder External Rotation 40 Degrees        TODAY'S TREATMENT Manual - R GH Grade 2 AP and Caudal glides; gentle GH distraction with PROM into ABD, ER, IR, and Flexion.  TherEx - Pulleys Scaption 15x, Flexion 15x Standing  Wand gentle stretching/PROM into B Ext 8x, IR 8x, and ER 8x (not AAROM) HEP instruct (supine wand exercises and self mobes)  Vasopneumatic Compression, Low Pressure, 38dg, 15' R Shoulder                     PT Education - 01/16/15 1411    Education provided Yes   Education Details HEP Progression   Person(s) Educated Patient   Methods Explanation;Demonstration;Handout   Comprehension Verbalized understanding          PT Short Term Goals - 01/16/15 1423    PT SHORT TERM GOAL #2   Title R Shoulder PROM Flexion to 150, ABD to 120, ER to 60 at 80 ABD by 02/22/15   Status On-going           PT Long Term Goals - 01/16/15 1423    PT LONG TERM GOAL #1   Title independent with advanced HEP as necessary by 03/12/15   Status On-going   PT LONG TERM GOAL #2   Title R shoulder AROM WFL and MMT 4/5  or better all planes by 03/12/15   Status On-going   PT LONG TERM GOAL #3   Title pt able to perform all ADLs and chores without limitation by R shoulder pain or weakness by 03/12/15   Status On-going   PT LONG TERM GOAL #4   Title pt able to return recreational activities without restriction by R shoulder pain or weakness by 03/12/15   Status On-going   PT LONG TERM GOAL #5   Title pt demonstate/verbalize info on physical activity to reduce risk of re-injury by 07/16/14   Status On-going               Plan - 01/16/15 1418    Clinical Impression Statement pt reporting daily pain values range 3/10 in AM and progress throughout the day to 7-8/10 by end of day.  Her PROM values remain fair to good at this point in her protocol.  She progresses to 4 wk portion of protocol next week which allows unlimited ABD but her current ABD is limited to 80 degrees due to pain.  Her flexion still limited to 100-110 and is less than this with pulleys.  Did add supine wand to HEP with hope that she can make some progress but advised her to not push too hard with HEP since her pain values are already quite high.   PT Next Visit Plan R Shoulder manual and PROM/AAROM within wk 4 protocol limits (ABD no limits, flexion no limits, ER to no limits at 0-80 ABD); add isometrics to HEP; add yellow TB ER, IR, Ext, and scap retraction all at 0 ABD to clinic exercises next week   Consulted and Agree with Plan of Care Patient        Problem List Patient Active Problem List   Diagnosis Date Noted  . Chest pain 05/09/2013  . Hypertension 02/23/2012  . Hyperlipidemia 02/23/2012  . Right shoulder pain 01/25/2012  . Left wrist pain 01/16/2012  . Neck pain 12/26/2011    Lyndi Holbein PT, OCS 01/16/2015, 2:24 PM  [facility] [address]  [address] [place], [place], [address] Phone: [phone]   Fax:  [phone]

## 2015-01-21 ENCOUNTER — Ambulatory Visit: Payer: Medicare Other | Admitting: Rehabilitation

## 2015-01-21 DIAGNOSIS — R29898 Other symptoms and signs involving the musculoskeletal system: Secondary | ICD-10-CM

## 2015-01-21 DIAGNOSIS — M25611 Stiffness of right shoulder, not elsewhere classified: Secondary | ICD-10-CM

## 2015-01-21 DIAGNOSIS — M25511 Pain in right shoulder: Secondary | ICD-10-CM

## 2015-01-21 NOTE — Therapy (Signed)
[facility] [address]  [address] [place], [place], [address] Phone: [phone]   Fax:  [phone]  Physical Therapy Treatment  Patient Details  Name: [name] [name] MRN: [id number] Date of Birth: 05/11/1945 Referring Provider:  Loreta Ave, MD  Encounter Date: 01/21/2015      PT End of Session - 01/21/15 1017    Visit Number 6   Number of Visits 20   Date for PT Re-Evaluation 03/12/15   PT Start Time 1018   PT Stop Time 1111   PT Time Calculation (min) 53 min      Past Medical History  Diagnosis Date  . Allergy   . Hypertension   . Hyperlipidemia   . IBS (irritable bowel syndrome)   . Arthritis   . Full dentures   . Wears glasses     readers  . Asthma     no inhalers  . PONV (postoperative nausea and vomiting)     Past Surgical History  Procedure Laterality Date  . Shoulder surgery      left  . Elbow surgery    . Knee surgery      lt  . Carpal tunnel release      rt  . Varicose vein surgery    . Nasal septum surgery    . Abdominal hysterectomy    . Cholecystectomy    . Appendectomy    . Tonsillectomy and adenoidectomy    . Breast lumpectomy  1985    lt  . Eye surgery      both cataracts  . Wrist arthroscopy Right 09/20/2013    Procedure: RIGHT ARTHROSCOPY WRIST EXCISION PISIFORM;  Surgeon: Wyn Forster, MD;  Location: [facility];  Service: Orthopedics;  Laterality: Right;  . Shoulder arthroscopy with bicepstenotomy Right 05/09/2014    Procedure: SHOULDER ARTHROSCOPY WITH BICEPSTENOTOMY;  Surgeon: Loreta Ave, MD;  Location: [facility];  Service: Orthopedics;  Laterality: Right;  Debridement Rotator Cuff Tear  . Shoulder acromioplasty Right 05/09/2014    Procedure: SHOULDER ACROMIOPLASTY;  Surgeon: Loreta Ave, MD;  Location: [facility];  Service: Orthopedics;  Laterality: Right;  . Shoulder arthroscopy with rotator cuff repair Right  12/19/2014    Procedure: RIGHT SHOULDER ARTHROSCOPY WITH ROTATOR CUFF REPAIR,CALCIFIC DEBRIDEMENT EXTENSIVE;  Surgeon: Loreta Ave, MD;  Location: [facility];  Service: Orthopedics;  Laterality: Right;    There were no vitals filed for this visit.  Visit Diagnosis:  Pain in joint, shoulder region, right  Shoulder weakness  Shoulder stiffness, right  Limitation of joint motion of right shoulder      Subjective Assessment - 01/21/15 1020    Subjective Reports shoulder feels terrible and the exercises hurt but she is doing them.    Currently in Pain? Yes   Pain Score --  4-5/10   Pain Location Shoulder   Pain Orientation Right;Anterior      TODAY'S TREATMENT Manual - R GH Grade 2 AP and Caudal glides; gentle GH distraction with PROM into ABD, ER, IR, and Flexion.  TherEx - Standing Shoulder Isometrics (flexion, abduction, ER, IR, Ext) 10x3" Pulleys Scaption 15x, Flexion 15x Standing Wand gentle stretching/PROM into B Ext 10x, IR 10x, ER 10x, Flexion 10x (not AAROM) HEP instruct (Shoulder isometrics)  Vasopneumatic Compression, Low Pressure, 38dg, 15' R Shoulder       PT Short Term Goals - 01/16/15 1423    PT SHORT TERM  GOAL #2   Title R Shoulder PROM Flexion to 150, ABD to 120, ER to 60 at 80 ABD by 02/22/15   Status On-going           PT Long Term Goals - 01/16/15 1423    PT LONG TERM GOAL #1   Title independent with advanced HEP as necessary by 03/12/15   Status On-going   PT LONG TERM GOAL #2   Title R shoulder AROM WFL and MMT 4/5 or better all planes by 03/12/15   Status On-going   PT LONG TERM GOAL #3   Title pt able to perform all ADLs and chores without limitation by R shoulder pain or weakness by 03/12/15   Status On-going   PT LONG TERM GOAL #4   Title pt able to return recreational activities without restriction by R shoulder pain or weakness by 03/12/15   Status On-going   PT LONG TERM GOAL #5   Title pt demonstate/verbalize info  on physical activity to reduce risk of re-injury by 07/16/14   Status On-going               Plan - 01/21/15 1056    Clinical Impression Statement Started and gave isometric exercises today. Did not add TB exercies yet but will at next appointment. Good tolerance to flexion pulleys today but still very limited with scaption. Increased PROM to pt tolerance and with increased protocol limits but pt very painful into abduction and ER.    PT Next Visit Plan R Shoulder manual and PROM/AAROM within wk 4 protocol limits (ABD no limits, flexion no limits, ER to no limits at 0-80 ABD); add isometrics to HEP; add yellow TB ER, IR, Ext, and scap retraction all at 0 ABD to clinic exercises next week   Consulted and Agree with Plan of Care Patient        Problem List Patient Active Problem List   Diagnosis Date Noted  . Chest pain 05/09/2013  . Hypertension 02/23/2012  . Hyperlipidemia 02/23/2012  . Right shoulder pain 01/25/2012  . Left wrist pain 01/16/2012  . Neck pain 12/26/2011    Ronney Lion, PTA 01/21/2015, 10:58 AM  [facility] [address]  [address] [place], [place], [address] Phone: [phone]   Fax:  [phone]

## 2015-01-23 ENCOUNTER — Ambulatory Visit: Payer: Medicare Other | Admitting: Rehabilitation

## 2015-01-23 DIAGNOSIS — R29898 Other symptoms and signs involving the musculoskeletal system: Secondary | ICD-10-CM

## 2015-01-23 DIAGNOSIS — M25611 Stiffness of right shoulder, not elsewhere classified: Secondary | ICD-10-CM

## 2015-01-23 DIAGNOSIS — M25511 Pain in right shoulder: Secondary | ICD-10-CM | POA: Diagnosis not present

## 2015-01-23 NOTE — Therapy (Signed)
[facility] [address]  [address] [place], [place], [address] Phone: [phone]   Fax:  [phone]  Physical Therapy Treatment  Patient Details  Name: [name] [name] MRN: [id number] Date of Birth: 10-14-1945 Referring Provider:  Loreta Ave, MD  Encounter Date: 01/23/2015      PT End of Session - 01/23/15 1319    Visit Number 7   Number of Visits 20   Date for PT Re-Evaluation 03/12/15   PT Start Time 1318   PT Stop Time 1415   PT Time Calculation (min) 57 min      Past Medical History  Diagnosis Date  . Allergy   . Hypertension   . Hyperlipidemia   . IBS (irritable bowel syndrome)   . Arthritis   . Full dentures   . Wears glasses     readers  . Asthma     no inhalers  . PONV (postoperative nausea and vomiting)     Past Surgical History  Procedure Laterality Date  . Shoulder surgery      left  . Elbow surgery    . Knee surgery      lt  . Carpal tunnel release      rt  . Varicose vein surgery    . Nasal septum surgery    . Abdominal hysterectomy    . Cholecystectomy    . Appendectomy    . Tonsillectomy and adenoidectomy    . Breast lumpectomy  1985    lt  . Eye surgery      both cataracts  . Wrist arthroscopy Right 09/20/2013    Procedure: RIGHT ARTHROSCOPY WRIST EXCISION PISIFORM;  Surgeon: Wyn Forster, MD;  Location: [facility];  Service: Orthopedics;  Laterality: Right;  . Shoulder arthroscopy with bicepstenotomy Right 05/09/2014    Procedure: SHOULDER ARTHROSCOPY WITH BICEPSTENOTOMY;  Surgeon: Loreta Ave, MD;  Location: [facility];  Service: Orthopedics;  Laterality: Right;  Debridement Rotator Cuff Tear  . Shoulder acromioplasty Right 05/09/2014    Procedure: SHOULDER ACROMIOPLASTY;  Surgeon: Loreta Ave, MD;  Location: [facility];  Service: Orthopedics;  Laterality: Right;  . Shoulder arthroscopy with rotator cuff repair Right  12/19/2014    Procedure: RIGHT SHOULDER ARTHROSCOPY WITH ROTATOR CUFF REPAIR,CALCIFIC DEBRIDEMENT EXTENSIVE;  Surgeon: Loreta Ave, MD;  Location: [facility];  Service: Orthopedics;  Laterality: Right;    There were no vitals filed for this visit.  Visit Diagnosis:  Pain in joint, shoulder region, right  Shoulder stiffness, right  Shoulder weakness  Limitation of joint motion of right shoulder      Subjective Assessment - 01/23/15 1321    Subjective Reports new exercises hurt but she is able to do them. Shoulder still feels terrible.    Currently in Pain? Yes   Pain Score 6    Pain Location Shoulder   Pain Orientation Right;Anterior   Pain Descriptors / Indicators Aching         TODAY'S TREATMENT Manual - R GH Grade 2 AP and Caudal glides; gentle GH distraction with PROM into ABD, ER, IR, and Flexion.  TherEx - Supine Wand gentle stretching into Flexion 10x Pulleys Scaption 15x, Flexion 15x Standing Rt IR/ER/Row/Ext Yellow TB 10x each  Standing Wand gentle stretching/PROM into B Ext 10x, IR 10x, ER 10x, Flexion 10x (not AAROM)  Vasopneumatic Compression, Low Pressure, 38dg, 15' R Shoulder  PT Short Term Goals - 01/16/15 1423    PT SHORT TERM GOAL #2   Title R Shoulder PROM Flexion to 150, ABD to 120, ER to 60 at 80 ABD by 02/22/15   Status On-going           PT Long Term Goals - 01/16/15 1423    PT LONG TERM GOAL #1   Title independent with advanced HEP as necessary by 03/12/15   Status On-going   PT LONG TERM GOAL #2   Title R shoulder AROM WFL and MMT 4/5 or better all planes by 03/12/15   Status On-going   PT LONG TERM GOAL #3   Title pt able to perform all ADLs and chores without limitation by R shoulder pain or weakness by 03/12/15   Status On-going   PT LONG TERM GOAL #4   Title pt able to return recreational activities without restriction by R shoulder pain or weakness by 03/12/15   Status On-going   PT LONG TERM GOAL #5    Title pt demonstate/verbalize info on physical activity to reduce risk of re-injury by 07/16/14   Status On-going               Plan - 01/23/15 1354    Clinical Impression Statement Began TB exercises today with good tolerance, noted most difficulty/pain with ER and Rows. Improvements movement noted with Abduction and ER PROM while pt was able to improve pulley motion on her own.    PT Next Visit Plan R Shoulder manual and PROM/AAROM within wk 5 protocol limits, measure PROM    Consulted and Agree with Plan of Care Patient        Problem List Patient Active Problem List   Diagnosis Date Noted  . Chest pain 05/09/2013  . Hypertension 02/23/2012  . Hyperlipidemia 02/23/2012  . Right shoulder pain 01/25/2012  . Left wrist pain 01/16/2012  . Neck pain 12/26/2011    Ronney LionShelby J Ashkan Chamberland, PTA 01/23/2015, 2:00 PM  [facility] [address]  [address] [place], [place], [address] Phone: [phone]   Fax:  [phone]

## 2015-01-28 ENCOUNTER — Ambulatory Visit: Payer: Medicare Other | Admitting: Physical Therapy

## 2015-01-28 DIAGNOSIS — R29898 Other symptoms and signs involving the musculoskeletal system: Secondary | ICD-10-CM

## 2015-01-28 DIAGNOSIS — M25511 Pain in right shoulder: Secondary | ICD-10-CM | POA: Diagnosis not present

## 2015-01-28 DIAGNOSIS — M25611 Stiffness of right shoulder, not elsewhere classified: Secondary | ICD-10-CM

## 2015-01-28 NOTE — Therapy (Signed)
[facility] [address]  [address] [place], [place], [address] Phone: [phone]   Fax:  [phone]  Physical Therapy Treatment  Patient Details  Name: [name] [name] MRN: [id number] Date of Birth: 1946-01-25 Referring Provider: Mckinley Jewel  Encounter Date: 01/28/2015      PT End of Session - 01/28/15 0940    Visit Number 8   Number of Visits 20   Date for PT Re-Evaluation 03/12/15   PT Start Time 0938   PT Stop Time 1036   PT Time Calculation (min) 58 min      Past Medical History  Diagnosis Date  . Allergy   . Hypertension   . Hyperlipidemia   . IBS (irritable bowel syndrome)   . Arthritis   . Full dentures   . Wears glasses     readers  . Asthma     no inhalers  . PONV (postoperative nausea and vomiting)     Past Surgical History  Procedure Laterality Date  . Shoulder surgery      left  . Elbow surgery    . Knee surgery      lt  . Carpal tunnel release      rt  . Varicose vein surgery    . Nasal septum surgery    . Abdominal hysterectomy    . Cholecystectomy    . Appendectomy    . Tonsillectomy and adenoidectomy    . Breast lumpectomy  1985    lt  . Eye surgery      both cataracts  . Wrist arthroscopy Right 09/20/2013    Procedure: RIGHT ARTHROSCOPY WRIST EXCISION PISIFORM;  Surgeon: Wyn Forster, MD;  Location: [facility];  Service: Orthopedics;  Laterality: Right;  . Shoulder arthroscopy with bicepstenotomy Right 05/09/2014    Procedure: SHOULDER ARTHROSCOPY WITH BICEPSTENOTOMY;  Surgeon: Loreta Ave, MD;  Location: [facility];  Service: Orthopedics;  Laterality: Right;  Debridement Rotator Cuff Tear  . Shoulder acromioplasty Right 05/09/2014    Procedure: SHOULDER ACROMIOPLASTY;  Surgeon: Loreta Ave, MD;  Location: [facility];  Service: Orthopedics;  Laterality: Right;  . Shoulder arthroscopy with rotator cuff repair Right 12/19/2014     Procedure: RIGHT SHOULDER ARTHROSCOPY WITH ROTATOR CUFF REPAIR,CALCIFIC DEBRIDEMENT EXTENSIVE;  Surgeon: Loreta Ave, MD;  Location: [facility];  Service: Orthopedics;  Laterality: Right;    There were no vitals filed for this visit.  Visit Diagnosis:  Pain in joint, shoulder region, right  Shoulder stiffness, right  Shoulder weakness  Limitation of joint motion of right shoulder      Subjective Assessment - 01/28/15 0938    Subjective States has noted sharp pain with wand ER HEP so stopped performing over the weekend; advised pt this is not the intent of HEP or ROM activities in general.  Reports continued difficulty with sleeping in part due to difficulty with sling and some due to shoulder pain.   Currently in Pain? Yes   Pain Score 4   rates pain 3-4/10 on AVG and up to 6-7/10 at worst   Pain Location Shoulder   Pain Orientation Right;Anterior            [facility] PT Assessment - 01/28/15 0001    Assessment   Referring Provider Mckinley Jewel   ROM / Strength   AROM / PROM / Strength PROM   PROM   Overall PROM Comments values still limited  by pain   Right Shoulder Flexion 103 Degrees   Right Shoulder ABduction 80 Degrees   Right Shoulder Internal Rotation 60 Degrees   Right Shoulder External Rotation 35 Degrees            TODAY'S TREATMENT Manual - R GH grade 2 AP and Caudal glides with some STM/TPR to R lateral pectoral due to high tone/guarding in this area PROM Assessment (no improvements noted)  TherEx - L Side-Lying R hand on beach ball with UE rolling into Shoulder Flexion 2x15 L Side-Lying R shoulder ER AROM 2x10 Standing hand on wall shoulder flexion slide AAROM 10x Finger Ladder Shoulder Flexion AAROM 10x Standing B Shoulder Ext with Scap retraction Yellow TB 15x3"  Kinesiotape o R Shoulder - 4 strips: 30% ant and post delt, 75% subacromial space, 50% lateral delt into UT  Vasopneumatic compression - low pressure, 38dg, 15', R  Shoulder                     PT Short Term Goals - 01/16/15 1423    PT SHORT TERM GOAL #2   Title R Shoulder PROM Flexion to 150, ABD to 120, ER to 60 at 80 ABD by 02/22/15   Status On-going           PT Long Term Goals - 01/16/15 1423    PT LONG TERM GOAL #1   Title independent with advanced HEP as necessary by 03/12/15   Status On-going   PT LONG TERM GOAL #2   Title R shoulder AROM WFL and MMT 4/5 or better all planes by 03/12/15   Status On-going   PT LONG TERM GOAL #3   Title pt able to perform all ADLs and chores without limitation by R shoulder pain or weakness by 03/12/15   Status On-going   PT LONG TERM GOAL #4   Title pt able to return recreational activities without restriction by R shoulder pain or weakness by 03/12/15   Status On-going   PT LONG TERM GOAL #5   Title pt demonstate/verbalize info on physical activity to reduce risk of re-injury by 07/16/14   Status On-going               Plan - 01/28/15 0942    Clinical Impression Statement Pt will be 6 wks out from R RCR in 2 days and has f/u with MD in 3 days and ROM values have not improved over the past 2 weeks.  ROM limited by pain and guarding and pt admits to being "terrified" of going too far and re-injuring shoulder which seems to add to level of guarding.  Trial of kinesiotape to shoulder today to see if can decrease pain.   PT Next Visit Plan R Shoulder manual and PROM/AAROM within wk 6 protocol limits, PR to MD   Consulted and Agree with Plan of Care Patient        Problem List Patient Active Problem List   Diagnosis Date Noted  . Chest pain 05/09/2013  . Hypertension 02/23/2012  . Hyperlipidemia 02/23/2012  . Right shoulder pain 01/25/2012  . Left wrist pain 01/16/2012  . Neck pain 12/26/2011    Paityn Balsam PT, OCS 01/28/2015, 11:14 AM  [facility] [address]  [address] [place], [place], [address] Phone:  [phone]   Fax:  [phone]  Name: Wellington HampshireLynn C Ozdemir MRN: [id number] Date of Birth: 1945-12-24

## 2015-01-30 ENCOUNTER — Ambulatory Visit: Payer: Medicare Other | Admitting: Physical Therapy

## 2015-01-30 DIAGNOSIS — R29898 Other symptoms and signs involving the musculoskeletal system: Secondary | ICD-10-CM

## 2015-01-30 DIAGNOSIS — M25611 Stiffness of right shoulder, not elsewhere classified: Secondary | ICD-10-CM

## 2015-01-30 DIAGNOSIS — M25511 Pain in right shoulder: Secondary | ICD-10-CM | POA: Diagnosis not present

## 2015-01-30 NOTE — Therapy (Signed)
[facility] [address]  [address] [place], [place], [address] Phone: [phone]   Fax:  [phone]  Physical Therapy Treatment  Patient Details  Name: [name] [name] MRN: [id number] Date of Birth: 04-01-1946 Referring Provider: [name]  Encounter Date: 01/30/2015      PT End of Session - 01/30/15 1325    Visit Number 9   Number of Visits 20   Date for PT Re-Evaluation 03/12/15   PT Start Time 1321   PT Stop Time 1422   PT Time Calculation (min) 61 min       There were no vitals filed for this visit.  Visit Diagnosis:  Pain in joint, shoulder region, right  Shoulder stiffness, right  Shoulder weakness  Limitation of joint motion of right shoulder      Subjective Assessment - 01/30/15 1324    Subjective States tape from last treatment seems to help with improved mobility with less pain.  She states she continues to have difficulty sleeping due to pain and she continues to note significant pain with AAROM aspect of HEP.   Currently in Pain? Yes   Pain Score 4    Pain Location Shoulder   Pain Orientation Right;Anterior            [facility] PT Assessment - 01/30/15 0001    PROM   Right Shoulder Flexion 117 Degrees   Right Shoulder ABduction 80 Degrees   Right Shoulder External Rotation 38 Degrees  at 45 ABD         TODAY'S TREATMENT TherEx - Pulleys Flexion and Scaption 20x each Seated Hands on PBall Flexion rollout 15x then 15x to L diagonal  Manual - R GH grade 2 AP and Caudal glides with PROM into Flexion, ABD, ER, and IR (still painful, guarded, and poor tolerance all directions)  TherEx -  L Side-Lying R hand on 8" Foam Roller with UE rolling into Shoulder Flexion 15x L Side-Lying R shoulder ER AROM 15x Supine serratus press 3# 15x Supine rhythmic stab 30" all planes Supine AAROM with PT Assist into Flexion 2x10, into ER/IR at 45 and at 60 ABD 10x each Standing hand on wall shoulder  flexion slide AAROM 15x  Vasopneumatic compression - low pressure, 38dg, 15', R Shoulder         PT Short Term Goals - 01/30/15 1722    PT SHORT TERM GOAL #2   Title R Shoulder PROM Flexion to 150, ABD to 120, ER to 60 at 80 ABD by 02/22/15   Status On-going           PT Long Term Goals - 01/30/15 1722    PT LONG TERM GOAL #1   Title independent with advanced HEP as necessary by 03/12/15   Status On-going   PT LONG TERM GOAL #2   Title R shoulder AROM WFL and MMT 4/5 or better all planes by 03/12/15   Status On-going   PT LONG TERM GOAL #3   Title pt able to perform all ADLs and chores without limitation by R shoulder pain or weakness by 03/12/15   Status On-going   PT LONG TERM GOAL #4   Title pt able to return recreational activities without restriction by R shoulder pain or weakness by 03/12/15   Status On-going               Plan - 01/30/15 1722    Clinical Impression Statement pt is 6 wks s/p R  RCR.  Her PROM values have improved but are still quite restricted and all planes are limited due to c/o pain.  Pain typically noted in subacromial area and pt rates pain 3-4/10 on AVG and up to 6-7/10 at worst lately.  Her PROM values are as follows: Flexion 117, ABD 80, ER 38 (at 45 ABD), and IR 60 (at 45 ABD). She performs well during treatments and is very motivated; however, she is also very fearful of re-injuring shoulder which seems to add to her guarding.  Overall, pretty good progress to date and am hopeful that she will meet all goals within current POC timeframe.   Pt will benefit from skilled therapeutic intervention in order to improve on the following deficits Pain;Decreased strength;Decreased mobility;Decreased range of motion;Impaired UE functional use   Rehab Potential Good   PT Next Visit Plan R Shoulder manual, ROM ,and exercises within wk 6 protocol limits   Consulted and Agree with Plan of Care Patient        Problem List Patient Active Problem  List   Diagnosis Date Noted  . Chest pain 05/09/2013  . Hypertension 02/23/2012  . Hyperlipidemia 02/23/2012  . Right shoulder pain 01/25/2012  . Left wrist pain 01/16/2012  . Neck pain 12/26/2011    [name] PT, OCS 01/30/2015, 5:28 PM  [facility] [address]  [address] [place], [place], [address] Phone: [phone]   Fax:  [phone]  Name: [name] [name] MRN: [id number] Date of Birth: 11/10/1945

## 2015-02-04 ENCOUNTER — Ambulatory Visit: Payer: Medicare Other | Admitting: Physical Therapy

## 2015-02-06 ENCOUNTER — Ambulatory Visit: Payer: Medicare Other | Admitting: Physical Therapy

## 2015-02-06 DIAGNOSIS — M25611 Stiffness of right shoulder, not elsewhere classified: Secondary | ICD-10-CM

## 2015-02-06 DIAGNOSIS — R29898 Other symptoms and signs involving the musculoskeletal system: Secondary | ICD-10-CM

## 2015-02-06 DIAGNOSIS — M25511 Pain in right shoulder: Secondary | ICD-10-CM | POA: Diagnosis not present

## 2015-02-06 NOTE — Therapy (Signed)
[facility] [address]  [address] [place], [place], [address] Phone: [phone]   Fax:  [phone]  Physical Therapy Treatment  Patient Details  Name: [name] HampshireLynn C [name] MRN: [id number] Date of Birth: 1945/10/16 Referring Provider: [name]  Encounter Date: 02/06/2015      PT End of Session - 02/06/15 0940    Visit Number 10   Number of Visits 20   Date for PT Re-Evaluation 03/12/15   PT Start Time 0938   PT Stop Time 1029   PT Time Calculation (min) 51 min      Past Medical History  Diagnosis Date  . Allergy   . Hypertension   . Hyperlipidemia   . IBS (irritable bowel syndrome)   . Arthritis   . Full dentures   . Wears glasses     readers  . Asthma     no inhalers  . PONV (postoperative nausea and vomiting)     Past Surgical History  Procedure Laterality Date  . Shoulder surgery      left  . Elbow surgery    . Knee surgery      lt  . Carpal tunnel release      rt  . Varicose vein surgery    . Nasal septum surgery    . Abdominal hysterectomy    . Cholecystectomy    . Appendectomy    . Tonsillectomy and adenoidectomy    . Breast lumpectomy  1985    lt  . Eye surgery      both cataracts  . Wrist arthroscopy Right 09/20/2013    Procedure: RIGHT ARTHROSCOPY WRIST EXCISION PISIFORM;  Surgeon: [name], MD;  Location: [facility];  Service: Orthopedics;  Laterality: Right;  . Shoulder arthroscopy with bicepstenotomy Right 05/09/2014    Procedure: SHOULDER ARTHROSCOPY WITH BICEPSTENOTOMY;  Surgeon: [name], MD;  Location: [facility];  Service: Orthopedics;  Laterality: Right;  Debridement Rotator Cuff Tear  . Shoulder acromioplasty Right 05/09/2014    Procedure: SHOULDER ACROMIOPLASTY;  Surgeon: [name], MD;  Location: [facility];  Service: Orthopedics;  Laterality: Right;  . Shoulder arthroscopy with rotator cuff repair Right 12/19/2014     Procedure: RIGHT SHOULDER ARTHROSCOPY WITH ROTATOR CUFF REPAIR,CALCIFIC DEBRIDEMENT EXTENSIVE;  Surgeon: [name], MD;  Location: [facility];  Service: Orthopedics;  Laterality: Right;    There were no vitals filed for this visit.  Visit Diagnosis:  Pain in joint, shoulder region, right  Shoulder stiffness, right  Shoulder weakness  Limitation of joint motion of right shoulder      Subjective Assessment - 02/06/15 0941    Subjective notes 9/10 with some movements but cc is persistent dull, ache rating 5/10   Currently in Pain? Yes   Pain Score 5    Pain Location Shoulder   Pain Orientation Right;Anterior   Pain Descriptors / Indicators Aching;Dull   Pain Onset More than a month ago   Pain Frequency Intermittent   Aggravating Factors  reaching            [facility] - 02/06/15 0001    PROM   Right Shoulder Flexion 120 Degrees   Right Shoulder External Rotation 51 Degrees  at 60 aBD            TODAY'S TREATMENT TherEx -  UBE, no resistance 1'/1'  Manual - R GH grade 2 AP and Caudal glides  with PROM into Flexion, ABD, ER, and IR (still painful, guarded, and poor tolerance all directions)  TherEx -  Supine SPC Chest Press 10x Supine SPC Chest Press with Pullover combo 10x Supine SPC Horiz ABD/ADD in 90 Flexion 10x L Side-Lying R shoulder ER AROM 15x L Side-Lying R hand on 8" Foam Roller with UE rolling into Shoulder Flexion 10x then 10x with Horiz ABD when to 90 Flexion Seated Hands on PBall Flexion rollout 10x then 10x to L diagonal Low Row 10# 10x Pulleys Flexion and Scaption 15x each Finger Ladder Shoulder Flexion 8x  Vasopneumatic compression - low pressure, 38dg, 15', R Shoulder                   PT Short Term Goals - 01/30/15 1722    PT SHORT TERM GOAL #2   Title R Shoulder PROM Flexion to 150, ABD to 120, ER to 60 at 80 ABD by 02/22/15   Status On-going           PT Long Term Goals - 01/30/15  1722    PT LONG TERM GOAL #1   Title independent with advanced HEP as necessary by 03/12/15   Status On-going   PT LONG TERM GOAL #2   Title R shoulder AROM WFL and MMT 4/5 or better all planes by 03/12/15   Status On-going   PT LONG TERM GOAL #3   Title pt able to perform all ADLs and chores without limitation by R shoulder pain or weakness by 03/12/15   Status On-going   PT LONG TERM GOAL #4   Title pt able to return recreational activities without restriction by R shoulder pain or weakness by 03/12/15   Status On-going               Plan - 2015/02/19 1034    Clinical Impression Statement pt is 7 wks s/p R RCR.  Improvement noted in ER and flexion PROM today and improved tolerance with AAROM exercises also noted but pt continues to note significant subacromial pain.   PT Next Visit Plan R Shoulder manual, ROM ,and exercises within wk 7 protocol limits   Consulted and Agree with Plan of Care Patient          G-Codes - 02-19-2015 0958    Functional Assessment Tool Used FOTO 63% limitation   Functional Limitation Carrying, moving and handling objects   Carrying, Moving and Handling Objects Current Status [phone]) At least 60 percent but less than 80 percent impaired, limited or restricted   Carrying, Moving and Handling Objects Goal Status (U0454) At least 40 percent but less than 60 percent impaired, limited or restricted      Problem List Patient Active Problem List   Diagnosis Date Noted  . Chest pain 05/09/2013  . Hypertension 02/23/2012  . Hyperlipidemia 02/23/2012  . Right shoulder pain 01/25/2012  . Left wrist pain 01/16/2012  . Neck pain 12/26/2011    Recia Sons PT, OCS 19-Feb-2015, 10:57 AM  [facility] [address]  [address] [place], [place], [address] Phone: [phone]   Fax:  [phone]  Name: [name] [name] MRN: [id number] Date of Birth: 1946-03-05

## 2015-02-11 ENCOUNTER — Ambulatory Visit: Payer: Medicare Other | Attending: Orthopedic Surgery | Admitting: Physical Therapy

## 2015-02-11 DIAGNOSIS — R29898 Other symptoms and signs involving the musculoskeletal system: Secondary | ICD-10-CM | POA: Insufficient documentation

## 2015-02-11 DIAGNOSIS — M25611 Stiffness of right shoulder, not elsewhere classified: Secondary | ICD-10-CM | POA: Insufficient documentation

## 2015-02-11 DIAGNOSIS — M25511 Pain in right shoulder: Secondary | ICD-10-CM | POA: Insufficient documentation

## 2015-02-11 NOTE — Therapy (Signed)
[facility] [address]  [address] [place], [place], [address] Phone: [phone]   Fax:  [phone]  Physical Therapy Treatment  Patient Details  Name: [name] HampshireLynn C Montalban MRN: [id number] Date of Birth: [birth year] Referring Provider: [name]  Encounter Date: 02/11/2015      PT End of Session - 02/11/15 1019    Visit Number 11   Number of Visits 20   Date for PT Re-Evaluation 03/12/15   PT Start Time 0940   PT Stop Time 1033   PT Time Calculation (min) 53 min      Past Medical History  Diagnosis Date  . Allergy   . Hypertension   . Hyperlipidemia   . IBS (irritable bowel syndrome)   . Arthritis   . Full dentures   . Wears glasses     readers  . Asthma     no inhalers  . PONV (postoperative nausea and vomiting)     Past Surgical History  Procedure Laterality Date  . Shoulder surgery      left  . Elbow surgery    . Knee surgery      lt  . Carpal tunnel release      rt  . Varicose vein surgery    . Nasal septum surgery    . Abdominal hysterectomy    . Cholecystectomy    . Appendectomy    . Tonsillectomy and adenoidectomy    . Breast lumpectomy  1985    lt  . Eye surgery      both cataracts  . Wrist arthroscopy Right 09/20/2013    Procedure: RIGHT ARTHROSCOPY WRIST EXCISION PISIFORM;  Surgeon: [name], MD;  Location: [facility];  Service: Orthopedics;  Laterality: Right;  . Shoulder arthroscopy with bicepstenotomy Right 05/09/2014    Procedure: SHOULDER ARTHROSCOPY WITH BICEPSTENOTOMY;  Surgeon: [name], MD;  Location: [facility];  Service: Orthopedics;  Laterality: Right;  Debridement Rotator Cuff Tear  . Shoulder acromioplasty Right 05/09/2014    Procedure: SHOULDER ACROMIOPLASTY;  Surgeon: [name], MD;  Location: [facility];  Service: Orthopedics;  Laterality: Right;  . Shoulder arthroscopy with rotator cuff repair Right 12/19/2014     Procedure: RIGHT SHOULDER ARTHROSCOPY WITH ROTATOR CUFF REPAIR,CALCIFIC DEBRIDEMENT EXTENSIVE;  Surgeon: [name], MD;  Location: [facility];  Service: Orthopedics;  Laterality: Right;    There were no vitals filed for this visit.  Visit Diagnosis:  Pain in joint, shoulder region, right  Shoulder stiffness, right  Shoulder weakness  Limitation of joint motion of right shoulder      Subjective Assessment - 02/11/15 0940    Subjective States has been doing more with UE lately but not with large increase in pain. States pain seems to run 4-6/10 lately.  However, states shoulder was aching last night and had difficulty sleeping due to this.   Currently in Pain? Yes   Pain Score 4    Pain Location Shoulder   Pain Orientation Right;Anterior             TODAY'S TREATMENT TherEx -  UBE, no resistance 1'/1'  Manual - R GH grade 2 AP and Caudal glides with PROM into Flexion, ABD, ER, and IR (still painful, but seemed quite well tolerated today, all planes limited by pain however)  TherEx -  Supine SPC Chest Press with serratus press 12x Supine SPC Chest Press with Pullover combo 12x  Supine SPC Horiz ABD/ADD in 90 Flexion 12x L Side-Lying R shoulder ER AROM 15x L Sdie-Lying R [place] ABD AAROM 10x Seated Hands on PBall Flexion rollout 12x then 10x to L diagonal Hand on Wall Shoulder Flexion slide 15x Low Row 10# 15x, 15# 10x Standing Back to Wall with Foam Roll along spine: B Scap retraction with Yellow TB 10x3"; B ER Yellow TB 12x  Vasopneumatic compression - low pressure, 40dg, 15', R Shoulder                       PT Short Term Goals - 01/30/15 1722    PT SHORT TERM GOAL #2   Title R Shoulder PROM Flexion to 150, ABD to 120, ER to 60 at 80 ABD by 02/22/15   Status On-going           PT Long Term Goals - 01/30/15 1722    PT LONG TERM GOAL #1   Title independent with advanced HEP as necessary by 03/12/15   Status  On-going   PT LONG TERM GOAL #2   Title R shoulder AROM WFL and MMT 4/5 or better all planes by 03/12/15   Status On-going   PT LONG TERM GOAL #3   Title pt able to perform all ADLs and chores without limitation by R shoulder pain or weakness by 03/12/15   Status On-going   PT LONG TERM GOAL #4   Title pt able to return recreational activities without restriction by R shoulder pain or weakness by 03/12/15   Status On-going               Plan - 02/11/15 1025    Clinical Impression Statement did not re-assess ROM today but seems to show signficant improvements in PROM and AAROM activities today.  She reports trying to use arm more without increases in pain but continues to note 4-6/10 pain and has difficulty sleeping due to pain.   PT Next Visit Plan R Shoulder manual, ROM ,and exercises within protocol limits   Consulted and Agree with Plan of Care Patient        Problem List Patient Active Problem List   Diagnosis Date Noted  . Chest pain 05/09/2013  . Hypertension 02/23/2012  . Hyperlipidemia 02/23/2012  . Right shoulder pain 01/25/2012  . Left wrist pain 01/16/2012  . Neck pain 12/26/2011    Shekita Boyden PT, OCS 02/11/2015, 10:28 AM  [facility] [address]  [address] [place], [place], [address] Phone: [phone]   Fax:  [phone]  Name: [name] [name] MRN: [id number] Date of Birth: Oct 01, 1945

## 2015-02-13 ENCOUNTER — Ambulatory Visit: Payer: Medicare Other | Admitting: Physical Therapy

## 2015-02-13 DIAGNOSIS — R29898 Other symptoms and signs involving the musculoskeletal system: Secondary | ICD-10-CM

## 2015-02-13 DIAGNOSIS — M25611 Stiffness of right shoulder, not elsewhere classified: Secondary | ICD-10-CM

## 2015-02-13 DIAGNOSIS — M25511 Pain in right shoulder: Secondary | ICD-10-CM | POA: Diagnosis not present

## 2015-02-13 NOTE — Therapy (Signed)
[facility] [address]  [address] [place], [place], [address] Phone: [phone]   Fax:  [phone]  Physical Therapy Treatment  Patient Details  Name: [name] [name] MRN: [id number] Date of Birth: 1945-05-09 Referring Provider: Mckinley Jewel  Encounter Date: 02/13/2015      PT End of Session - 02/13/15 0843    Visit Number 12   Number of Visits 20   Date for PT Re-Evaluation 03/12/15   PT Start Time 0842   PT Stop Time 0953   PT Time Calculation (min) 71 min      Past Medical History  Diagnosis Date  . Allergy   . Hypertension   . Hyperlipidemia   . IBS (irritable bowel syndrome)   . Arthritis   . Full dentures   . Wears glasses     readers  . Asthma     no inhalers  . PONV (postoperative nausea and vomiting)     Past Surgical History  Procedure Laterality Date  . Shoulder surgery      left  . Elbow surgery    . Knee surgery      lt  . Carpal tunnel release      rt  . Varicose vein surgery    . Nasal septum surgery    . Abdominal hysterectomy    . Cholecystectomy    . Appendectomy    . Tonsillectomy and adenoidectomy    . Breast lumpectomy  1985    lt  . Eye surgery      both cataracts  . Wrist arthroscopy Right 09/20/2013    Procedure: RIGHT ARTHROSCOPY WRIST EXCISION PISIFORM;  Surgeon: Wyn Forster, MD;  Location: [facility];  Service: Orthopedics;  Laterality: Right;  . Shoulder arthroscopy with bicepstenotomy Right 05/09/2014    Procedure: SHOULDER ARTHROSCOPY WITH BICEPSTENOTOMY;  Surgeon: Loreta Ave, MD;  Location: [facility];  Service: Orthopedics;  Laterality: Right;  Debridement Rotator Cuff Tear  . Shoulder acromioplasty Right 05/09/2014    Procedure: SHOULDER ACROMIOPLASTY;  Surgeon: Loreta Ave, MD;  Location: [facility];  Service: Orthopedics;  Laterality: Right;  . Shoulder arthroscopy with rotator cuff repair Right 12/19/2014     Procedure: RIGHT SHOULDER ARTHROSCOPY WITH ROTATOR CUFF REPAIR,CALCIFIC DEBRIDEMENT EXTENSIVE;  Surgeon: Loreta Ave, MD;  Location: [facility];  Service: Orthopedics;  Laterality: Right;    There were no vitals filed for this visit.  Visit Diagnosis:  Pain in joint, shoulder region, right  Shoulder stiffness, right  Shoulder weakness  Limitation of joint motion of right shoulder      Subjective Assessment - 02/13/15 0843    Subjective states notes pain with reaching during ADLs and with driving at times but overall states is pleased with improved ability to take care of herself.   Currently in Pain? Yes   Pain Score 4   states pain is typically 4/10 in AM but increases throughout the day up to 8/10   Pain Location Shoulder   Pain Orientation Right;Anterior            [facility] PT Assessment - 02/13/15 0001    PROM   Right Shoulder Flexion 126 Degrees   Right Shoulder ABduction 93 Degrees   Right Shoulder Internal Rotation 62 Degrees  at 70 ABD   Right Shoulder External Rotation 56 Degrees  at 70 ABD         TODAY'S TREATMENT TherEx -  UBE lvl 1.0 1'/1' Supine SPC Chest Press with serratus press 2# 12x Supine SPC Chest Press with Pullover combo 2# 12x Supine SPC Horiz ABD/ADD in 90 Flexion 2# 12x  Manual - R GH grade 2 AP and Caudal glides with PROM into Flexion, ABD, ER, and IR (all planes limited by pain)  Supine R GH protraction with CW/CCW at 90 flex 2# 12x each L Side-Lying R shoulder ER AROM 10x then 12x with 1# L Side-Lying R Shoudler ABD AROM 10x with bent elbow L Side-Lying R Shoulder Horiz ABD AROM 10x Standing Wand IR stretch 6x5" Standing Wand Shoulder Extension AAROM 10x Standing Wand Flexion AAROM 10x (wand perpendicular to floor) Narrow Pulldown Flexion AAROM 10# 10x Prone Hand on stool shoulder ABD AROM 10x Prone R Low Row 2# 10x Seated ER Stretch with Yellow TB 10x2"  Kinesiology Taping: 4 strips to R  Shoulder  Vasopneumatic compression - low pressure, 40dg, 15', R Shoulder                    PT Short Term Goals - 02/13/15 0946    PT SHORT TERM GOAL #2   Title R Shoulder PROM Flexion to 150, ABD to 120, ER to 60 at 80 ABD by 02/22/15   Status On-going           PT Long Term Goals - 01/30/15 1722    PT LONG TERM GOAL #1   Title independent with advanced HEP as necessary by 03/12/15   Status On-going   PT LONG TERM GOAL #2   Title R shoulder AROM WFL and MMT 4/5 or better all planes by 03/12/15   Status On-going   PT LONG TERM GOAL #3   Title pt able to perform all ADLs and chores without limitation by R shoulder pain or weakness by 03/12/15   Status On-going   PT LONG TERM GOAL #4   Title pt able to return recreational activities without restriction by R shoulder pain or weakness by 03/12/15   Status On-going               Plan - 02/13/15 0849    Clinical Impression Statement pt is 8wks s/p R RCR today.  She displays good improvement in ROM over the past week and is tolerating more variety of exercises in the clinic.  She continues to note great deal of pain in the evenings stating is same pain she had since first surgery and is located mostly in anterior shoulder.   PT Next Visit Plan R Shoulder manual, ROM ,and exercises within protocol limits; progress HEP   Consulted and Agree with Plan of Care Patient        Problem List Patient Active Problem List   Diagnosis Date Noted  . Chest pain 05/09/2013  . Hypertension 02/23/2012  . Hyperlipidemia 02/23/2012  . Right shoulder pain 01/25/2012  . Left wrist pain 01/16/2012  . Neck pain 12/26/2011    Ubah Radke PT, OCS 02/13/2015, 10:04 AM  [facility] [address]  [address] [place], [place], [address] Phone: [phone]   Fax:  [phone]  Name: Wellington HampshireLynn C Tesch MRN: [id number] Date of Birth: 10-Apr-1946

## 2015-02-18 ENCOUNTER — Ambulatory Visit: Payer: Medicare Other | Admitting: Physical Therapy

## 2015-02-18 DIAGNOSIS — M25511 Pain in right shoulder: Secondary | ICD-10-CM | POA: Diagnosis not present

## 2015-02-18 DIAGNOSIS — R29898 Other symptoms and signs involving the musculoskeletal system: Secondary | ICD-10-CM

## 2015-02-18 DIAGNOSIS — M25611 Stiffness of right shoulder, not elsewhere classified: Secondary | ICD-10-CM

## 2015-02-18 NOTE — Therapy (Signed)
[facility] [address]  [address] [place], [place], [address] Phone: [phone]   Fax:  [phone]  Physical Therapy Treatment  Patient Details  Name: [name] HampshireLynn C Kilbourne MRN: [id number] Date of Birth: 1945-09-22 Referring Provider: [name]  Encounter Date: 02/18/2015      PT End of Session - 02/18/15 0933    Visit Number 13   Number of Visits 20   Date for PT Re-Evaluation 03/12/15   PT Start Time 0932   PT Stop Time 1032   PT Time Calculation (min) 60 min      Past Medical History  Diagnosis Date  . Allergy   . Hypertension   . Hyperlipidemia   . IBS (irritable bowel syndrome)   . Arthritis   . Full dentures   . Wears glasses     readers  . Asthma     no inhalers  . PONV (postoperative nausea and vomiting)     Past Surgical History  Procedure Laterality Date  . Shoulder surgery      left  . Elbow surgery    . Knee surgery      lt  . Carpal tunnel release      rt  . Varicose vein surgery    . Nasal septum surgery    . Abdominal hysterectomy    . Cholecystectomy    . Appendectomy    . Tonsillectomy and adenoidectomy    . Breast lumpectomy  1985    lt  . Eye surgery      both cataracts  . Wrist arthroscopy Right 09/20/2013    Procedure: RIGHT ARTHROSCOPY WRIST EXCISION PISIFORM;  Surgeon: [name], MD;  Location: [facility];  Service: Orthopedics;  Laterality: Right;  . Shoulder arthroscopy with bicepstenotomy Right 05/09/2014    Procedure: SHOULDER ARTHROSCOPY WITH BICEPSTENOTOMY;  Surgeon: [name], MD;  Location: [facility];  Service: Orthopedics;  Laterality: Right;  Debridement Rotator Cuff Tear  . Shoulder acromioplasty Right 05/09/2014    Procedure: SHOULDER ACROMIOPLASTY;  Surgeon: [name], MD;  Location: [facility];  Service: Orthopedics;  Laterality: Right;  . Shoulder arthroscopy with rotator cuff repair Right 12/19/2014     Procedure: RIGHT SHOULDER ARTHROSCOPY WITH ROTATOR CUFF REPAIR,CALCIFIC DEBRIDEMENT EXTENSIVE;  Surgeon: [name], MD;  Location: [facility];  Service: Orthopedics;  Laterality: Right;    There were no vitals filed for this visit.  Visit Diagnosis:  Pain in joint, shoulder region, right  Shoulder stiffness, right  Shoulder weakness      Subjective Assessment - 02/18/15 0957    Subjective states pain during the day is improving and is able to reach 2nd shelf in kitchen cupboards but states she continues to note increasing pain throughout the day.  States pain is typically 2-4/10 throughout the day but then up to 6-7/10 after dinner.  States has difficulty sleeping every night.   Currently in Pain? Yes   Pain Score 2    Pain Location Shoulder   Pain Orientation Right;Anterior          TODAY'S TREATMENT TherEx - UBE lvl 1.5 90"/90"  Manual - R GH Grade 2 AP mobes then TPR and STM throughout R posterior scap mms (great deal of tenderness and high tone noted)  TherEx -  L Side-Lying R shoulder ER AROM 12x then 12x with 1# L Side-Lying R Shoulder ABD AROM 12x L Side-Lying R  Shoulder CW/CCW 1# 10x each Supine SPC Chest Press with serratus press 3# 12x Supine SPC Chest Press with Pullover combo 3# 12x Supine Serratus Press with CW/CCW 1# 12x each Supine SPC Horiz ABD/ADD in 90 Flexion AROM 12x Standing Tricep Pushdown 10# 12x Low Row 15# 12x Standing ER Yellow TB 15x Standing IR Yellow TB 15x Standing B Shoulder Ext with Scap Retraction Yellow TB 12x2" Finger Ladder Shoulder Flexion 8x  Vasopneumatic compression - low pressure, 40dg, 15', R Shoulder              PT Short Term Goals - 02/13/15 0946    PT SHORT TERM GOAL #2   Title R Shoulder PROM Flexion to 150, ABD to 120, ER to 60 at 80 ABD by 02/22/15   Status On-going           PT Long Term Goals - 02/18/15 1106    PT LONG TERM GOAL #1   Title independent with advanced HEP as  necessary by 03/12/15   Status On-going   PT LONG TERM GOAL #2   Title R shoulder AROM WFL and MMT 4/5 or better all planes by 03/12/15   Status On-going   PT LONG TERM GOAL #3   Title pt able to perform all ADLs and chores without limitation by R shoulder pain or weakness by 03/12/15   Status On-going   PT LONG TERM GOAL #4   Title pt able to return recreational activities without restriction by R shoulder pain or weakness by 03/12/15   Status On-going               Plan - 02/18/15 1101    Clinical Impression Statement pt continues to c/o increasing shoulder pain throughout the day and states this limits her ability to sleep.  She states function is improving and is able to reach 2nd shelf in kitchen cupboards.   PT Next Visit Plan R Shoulder manual, ROM ,and exercises within protocol limits; progress HEP   Consulted and Agree with Plan of Care Patient        Problem List Patient Active Problem List   Diagnosis Date Noted  . Chest pain 05/09/2013  . Hypertension 02/23/2012  . Hyperlipidemia 02/23/2012  . Right shoulder pain 01/25/2012  . Left wrist pain 01/16/2012  . Neck pain 12/26/2011    Celsa Nordahl PT, OCS 02/18/2015, 11:09 AM  [facility] [address]  [address] [place], [place], [address] Phone: [phone]   Fax:  [phone]  Name: [name] [name] MRN: [id number] Date of Birth: Dec 21, 1945

## 2015-02-20 ENCOUNTER — Ambulatory Visit: Payer: Medicare Other | Admitting: Physical Therapy

## 2015-02-25 ENCOUNTER — Ambulatory Visit: Payer: Medicare Other | Admitting: Physical Therapy

## 2015-02-25 DIAGNOSIS — R29898 Other symptoms and signs involving the musculoskeletal system: Secondary | ICD-10-CM

## 2015-02-25 DIAGNOSIS — M25611 Stiffness of right shoulder, not elsewhere classified: Secondary | ICD-10-CM

## 2015-02-25 DIAGNOSIS — M25511 Pain in right shoulder: Secondary | ICD-10-CM

## 2015-02-25 NOTE — Therapy (Signed)
[facility] Outpatient Rehabilitation [facility] [address]  [address] [place], [place], [address] Phone: [phone]   Fax:  [phone]  Physical Therapy Treatment  Patient Details  Name: [name] [name] MRN: [id number] Date of Birth: 18-Feb-1946 Referring Provider: Mckinley Jewel  Encounter Date: 02/25/2015      PT End of Session - 02/25/15 0937    Visit Number 14   Number of Visits 20   Date for PT Re-Evaluation 03/12/15   PT Start Time 0936   PT Stop Time 1042   PT Time Calculation (min) 66 min      Past Medical History  Diagnosis Date  . Allergy   . Hypertension   . Hyperlipidemia   . IBS (irritable bowel syndrome)   . Arthritis   . Full dentures   . Wears glasses     readers  . Asthma     no inhalers  . PONV (postoperative nausea and vomiting)     Past Surgical History  Procedure Laterality Date  . Shoulder surgery      left  . Elbow surgery    . Knee surgery      lt  . Carpal tunnel release      rt  . Varicose vein surgery    . Nasal septum surgery    . Abdominal hysterectomy    . Cholecystectomy    . Appendectomy    . Tonsillectomy and adenoidectomy    . Breast lumpectomy  1985    lt  . Eye surgery      both cataracts  . Wrist arthroscopy Right 09/20/2013    Procedure: RIGHT ARTHROSCOPY WRIST EXCISION PISIFORM;  Surgeon: Wyn Forster, MD;  Location: [facility];  Service: Orthopedics;  Laterality: Right;  . Shoulder arthroscopy with bicepstenotomy Right 05/09/2014    Procedure: SHOULDER ARTHROSCOPY WITH BICEPSTENOTOMY;  Surgeon: Loreta Ave, MD;  Location: [facility];  Service: Orthopedics;  Laterality: Right;  Debridement Rotator Cuff Tear  . Shoulder acromioplasty Right 05/09/2014    Procedure: SHOULDER ACROMIOPLASTY;  Surgeon: Loreta Ave, MD;  Location: [facility];  Service: Orthopedics;  Laterality: Right;  . Shoulder arthroscopy with rotator cuff repair Right 12/19/2014     Procedure: RIGHT SHOULDER ARTHROSCOPY WITH ROTATOR CUFF REPAIR,CALCIFIC DEBRIDEMENT EXTENSIVE;  Surgeon: Loreta Ave, MD;  Location: [facility];  Service: Orthopedics;  Laterality: Right;    There were no vitals filed for this visit.  Visit Diagnosis:  Pain in joint, shoulder region, right  Shoulder stiffness, right  Shoulder weakness  Limitation of joint motion of right shoulder      Subjective Assessment - 02/25/15 0941    Subjective states shoulder pain woke her last night more than once and states pain up to 9-10/10 at that point.  Noted sharp, stabbing pain to anterior shoulder late last week while reaching across body but otherwise states shoulder is just a constant ache pain.  Is performing HEP regularly.   Currently in Pain? Yes   Pain Score 4    Pain Location Shoulder   Pain Orientation Right;Anterior            [facility] PT Assessment - 02/25/15 0001    Assessment   Next MD Visit 03/14/15           TODAY'S TREATMENT TherEx - UBE lvl 1.5 90"/90"  Manual - STM and TPR R biceps due to pain and high tone (pain noted extending  into neck and shoulder with palpation here today); R GH Grade 2 AP and caudal glides with mobes into ER, Flexion and IR to tolerance.  TherEx - Supine R D2 AROM 10x (attempted D1 but too painful) Supine R Shoulder Scap protraction with CW/CCW 15x each L Side-Lying R Shoulder CW/CCW 15x each L Side-Lying R Shoulder Horiz ABD AROM 15x L Side-Lying R Shoulder ABD AROM 15x Standing Back to wall with Foam Roll along Spine:   B ER Yellow TB 15x   B W Yellow TB 10x Standing Elbows on PBall (65cm) scap protraction  Standing Shoulder Flexion wall slide 0# 10x, 1# 10x  Vasopneumatic compression - low pressure, 40dg, 15', R Shoulder          PT Short Term Goals - 02/13/15 0946    PT SHORT TERM GOAL #2   Title R Shoulder PROM Flexion to 150, ABD to 120, ER to 60 at 80 ABD by 02/22/15   Status On-going            PT Long Term Goals - 02/18/15 1106    PT LONG TERM GOAL #1   Title independent with advanced HEP as necessary by 03/12/15   Status On-going   PT LONG TERM GOAL #2   Title R shoulder AROM WFL and MMT 4/5 or better all planes by 03/12/15   Status On-going   PT LONG TERM GOAL #3   Title pt able to perform all ADLs and chores without limitation by R shoulder pain or weakness by 03/12/15   Status On-going   PT LONG TERM GOAL #4   Title pt able to return recreational activities without restriction by R shoulder pain or weakness by 03/12/15   Status On-going               Plan - 02/25/15 1038    Clinical Impression Statement Pt with a great deal of pain last night for unknown reason and still with some increased pain this AM compared to last week.  Shoulder is moving well with PROM and AAROM but pain is limiting AROM.  Large area of tenderness and increased density noted in R Biceps today and pt notes pain into shoulder with palpation here.  Biceps tendon was addressed in January surgery but this still seems to be contributing to shoulder pain.     PT Next Visit Plan Re-assess ROM; R Shoulder manual, ROM ,and exercises within protocol limits; progress HEP   Consulted and Agree with Plan of Care Patient        Problem List Patient Active Problem List   Diagnosis Date Noted  . Chest pain 05/09/2013  . Hypertension 02/23/2012  . Hyperlipidemia 02/23/2012  . Right shoulder pain 01/25/2012  . Left wrist pain 01/16/2012  . Neck pain 12/26/2011    Ellie Bryand PT, OCS 02/25/2015, 11:02 AM  [facility] [address]  [address] [place], [place], [address] Phone: [phone]   Fax:  [phone]  Name: [name] [name] MRN: [id number] Date of Birth: 1945/12/14

## 2015-02-27 ENCOUNTER — Ambulatory Visit: Payer: Medicare Other | Admitting: Physical Therapy

## 2015-02-27 DIAGNOSIS — R29898 Other symptoms and signs involving the musculoskeletal system: Secondary | ICD-10-CM

## 2015-02-27 DIAGNOSIS — M25511 Pain in right shoulder: Secondary | ICD-10-CM | POA: Diagnosis not present

## 2015-02-27 DIAGNOSIS — M25611 Stiffness of right shoulder, not elsewhere classified: Secondary | ICD-10-CM

## 2015-02-27 NOTE — Therapy (Signed)
[facility] Outpatient Rehabilitation [facility] [address]  [address] [place], [place], [address] Phone: [phone]   Fax:  [phone]  Physical Therapy Treatment  Patient Details  Name: [name] [name] MRN: [id number] Date of Birth: [birth year] Referring Provider: Mckinley Jewel  Encounter Date: 02/27/2015      PT End of Session - 02/27/15 1315    Visit Number 15   Number of Visits 20   Date for PT Re-Evaluation 03/12/15   PT Start Time 1313   PT Stop Time 1415   PT Time Calculation (min) 62 min      Past Medical History  Diagnosis Date  . Allergy   . Hypertension   . Hyperlipidemia   . IBS (irritable bowel syndrome)   . Arthritis   . Full dentures   . Wears glasses     readers  . Asthma     no inhalers  . PONV (postoperative nausea and vomiting)     Past Surgical History  Procedure Laterality Date  . Shoulder surgery      left  . Elbow surgery    . Knee surgery      lt  . Carpal tunnel release      rt  . Varicose vein surgery    . Nasal septum surgery    . Abdominal hysterectomy    . Cholecystectomy    . Appendectomy    . Tonsillectomy and adenoidectomy    . Breast lumpectomy  1985    lt  . Eye surgery      both cataracts  . Wrist arthroscopy Right 09/20/2013    Procedure: RIGHT ARTHROSCOPY WRIST EXCISION PISIFORM;  Surgeon: Wyn Forster, MD;  Location: [facility];  Service: Orthopedics;  Laterality: Right;  . Shoulder arthroscopy with bicepstenotomy Right 05/09/2014    Procedure: SHOULDER ARTHROSCOPY WITH BICEPSTENOTOMY;  Surgeon: Loreta Ave, MD;  Location: [facility];  Service: Orthopedics;  Laterality: Right;  Debridement Rotator Cuff Tear  . Shoulder acromioplasty Right 05/09/2014    Procedure: SHOULDER ACROMIOPLASTY;  Surgeon: Loreta Ave, MD;  Location: [facility];  Service: Orthopedics;  Laterality: Right;  . Shoulder arthroscopy with rotator cuff repair Right 12/19/2014     Procedure: RIGHT SHOULDER ARTHROSCOPY WITH ROTATOR CUFF REPAIR,CALCIFIC DEBRIDEMENT EXTENSIVE;  Surgeon: Loreta Ave, MD;  Location: [facility];  Service: Orthopedics;  Laterality: Right;    There were no vitals filed for this visit.  Visit Diagnosis:  Pain in joint, shoulder region, right  Shoulder stiffness, right  Shoulder weakness  Limitation of joint motion of right shoulder      Subjective Assessment - 02/27/15 1314    Subjective states is able to drive but unable to have hand go over top of steering wheel due to pain.     Currently in Pain? Yes   Pain Score --  5-6/10   Pain Location Shoulder   Pain Orientation Right;Anterior;Upper            [facility] PT Assessment - 02/27/15 0001    ROM / Strength   AROM / PROM / Strength AROM   AROM   AROM Assessment Site Shoulder   Right/Left Shoulder Right   Right Shoulder Flexion 124 Degrees  slight scaption   Right Shoulder ABduction 76 Degrees   PROM   Right Shoulder Flexion 135 Degrees   Right Shoulder External Rotation 70 Degrees  at 70 and 90 ABD  TODAY'S TREATMENT TherEx - UBE lvl 1.5 90"/90"  Manual - R GH grade 2 AP and caudal glides with ER, Flex, ABD mobes. PROM and AROM assessment.  TherEx - L Side-Lying R ER AROM 15x then 15x with 1# L Side-Lying R Shoulder ABD 1# with 90 elbow flexion 10x Supine Wand Chest press with 2# cuff 10x Supine Wand Chest press with pullover 2# 10x Standing ER Yellow TB 15x Standing IR Yellow TB 15x Standing B Shoulder Extension Green TB 15x Standing Wand IR Stretch/AAROM 15x Standing Wand Chest Press 10x  Vasopneumatic compression - low pressure, 40dg, 15', R Shoulder                  PT Education - 02/27/15 1503    Education provided Yes   Education Details HEP progression   Person(s) Educated Patient   Methods Explanation;Demonstration;Handout   Comprehension Verbalized understanding;Returned demonstration           PT Short Term Goals - 02/13/15 0946    PT SHORT TERM GOAL #2   Title R Shoulder PROM Flexion to 150, ABD to 120, ER to 60 at 80 ABD by 02/22/15   Status On-going           PT Long Term Goals - 02/18/15 1106    PT LONG TERM GOAL #1   Title independent with advanced HEP as necessary by 03/12/15   Status On-going   PT LONG TERM GOAL #2   Title R shoulder AROM WFL and MMT 4/5 or better all planes by 03/12/15   Status On-going   PT LONG TERM GOAL #3   Title pt able to perform all ADLs and chores without limitation by R shoulder pain or weakness by 03/12/15   Status On-going   PT LONG TERM GOAL #4   Title pt able to return recreational activities without restriction by R shoulder pain or weakness by 03/12/15   Status On-going               Plan - 02/27/15 1509    Clinical Impression Statement pt is 10 wks s/p R RCR today.  Her ROM is improving fairly well and AROM Flexion is over 120 degrees.  Chief limitation is nearly constant shoudler pain which pt rates up to 6/10 today and up to 8/10 at night.  Pain is located anterior shoulder and is noted especially with horiz adduction but also notes with flexion and other overhead motions.  Updated HEP with some tband exercises which were well tolerated in clinic.   PT Next Visit Plan R Shoulder manual, ROM ,and exercises within protocol limits - give pt HEP HO from today's treatment   Consulted and Agree with Plan of Care Patient        Problem List Patient Active Problem List   Diagnosis Date Noted  . Chest pain 05/09/2013  . Hypertension 02/23/2012  . Hyperlipidemia 02/23/2012  . Right shoulder pain 01/25/2012  . Left wrist pain 01/16/2012  . Neck pain 12/26/2011    Jeb Schloemer PT, OCS 02/27/2015, 3:14 PM  [facility] [address]  [address] [place], [place], [address] Phone: [phone]   Fax:  [phone]  Name: [name] [name] MRN: [id number] Date of Birth:  22-Nov-1945

## 2015-03-04 ENCOUNTER — Ambulatory Visit: Payer: Medicare Other | Admitting: Physical Therapy

## 2015-03-04 DIAGNOSIS — M25511 Pain in right shoulder: Secondary | ICD-10-CM

## 2015-03-04 DIAGNOSIS — R29898 Other symptoms and signs involving the musculoskeletal system: Secondary | ICD-10-CM

## 2015-03-04 DIAGNOSIS — M25611 Stiffness of right shoulder, not elsewhere classified: Secondary | ICD-10-CM

## 2015-03-04 NOTE — Therapy (Signed)
[facility] [address]  [address] [place], [place], [address] Phone: [phone]   Fax:  [phone]  Physical Therapy Treatment  Patient Details  Name: [name] [name] MRN: [id number] Date of Birth: January 18, 1946 Referring Provider: [name]  Encounter Date: 03/04/2015      PT End of Session - 03/04/15 1002    Visit Number 16   Number of Visits 20   Date for PT Re-Evaluation 03/12/15   PT Start Time 0932   PT Stop Time 1033   PT Time Calculation (min) 61 min      Past Medical History  Diagnosis Date  . Allergy   . Hypertension   . Hyperlipidemia   . IBS (irritable bowel syndrome)   . Arthritis   . Full dentures   . Wears glasses     readers  . Asthma     no inhalers  . PONV (postoperative nausea and vomiting)     Past Surgical History  Procedure Laterality Date  . Shoulder surgery      left  . Elbow surgery    . Knee surgery      lt  . Carpal tunnel release      rt  . Varicose vein surgery    . Nasal septum surgery    . Abdominal hysterectomy    . Cholecystectomy    . Appendectomy    . Tonsillectomy and adenoidectomy    . Breast lumpectomy  1985    lt  . Eye surgery      both cataracts  . Wrist arthroscopy Right 09/20/2013    Procedure: RIGHT ARTHROSCOPY WRIST EXCISION PISIFORM;  Surgeon: [name], MD;  Location: [facility];  Service: Orthopedics;  Laterality: Right;  . Shoulder arthroscopy with bicepstenotomy Right 05/09/2014    Procedure: SHOULDER ARTHROSCOPY WITH BICEPSTENOTOMY;  Surgeon: [name], MD;  Location: [facility];  Service: Orthopedics;  Laterality: Right;  Debridement Rotator Cuff Tear  . Shoulder acromioplasty Right 05/09/2014    Procedure: SHOULDER ACROMIOPLASTY;  Surgeon: [name], MD;  Location: [facility];  Service: Orthopedics;  Laterality: Right;  . Shoulder arthroscopy with rotator cuff repair Right 12/19/2014     Procedure: RIGHT SHOULDER ARTHROSCOPY WITH ROTATOR CUFF REPAIR,CALCIFIC DEBRIDEMENT EXTENSIVE;  Surgeon: [name], MD;  Location:  [facility];  Service: Orthopedics;  Laterality: Right;    There were no vitals filed for this visit.  Visit Diagnosis:  Pain in joint, shoulder region, right  Shoulder stiffness, right  Shoulder weakness  Limitation of joint motion of right shoulder      Subjective Assessment - 03/04/15 0933    Subjective "still always around 4/10".  States used couch pillow to help support shoulder last night and slept much better (only woke up once vs every 1-2 hours.)   Currently in Pain? Yes   Pain Score 4    Pain Location Shoulder   Pain Orientation Right;Anterior   Pain Descriptors / Indicators (p) Aching            TODAY'S TREATMENT Combo: US - 20%, 5cm, 1.0MHz, 0.5W/cm2 with Premod E-stim (10-20Hz ) to anterior R shoulder x 12'  TherEx - Kinesiotape to R Shoulder: 4 strips, 75% lateral subacromial space, 50% anterior and posterior delt, 50% lateral delt into supraspin Supine R Shoulder CW/CCW with 5# db 15x each in 90 Flexion Supine Chest Press plus with SPC and 2# 15x Supine Pullover  thw SPC and 2# 15x L Side-Lying R ER 1# 15x L Side-Lying R CW/CCW in 90 ABD 2# 15x each  Manual -  Supine grade 2 AP mobes R GH  Vasopneumatic Compression - low pressure, 38dg, 15'                        PT Short Term Goals - 02/13/15 0946    PT SHORT TERM GOAL #2   Title R Shoulder PROM Flexion to 150, ABD to 120, ER to 60 at 80 ABD by 02/22/15   Status On-going           PT Long Term Goals - 02/18/15 1106    PT LONG TERM GOAL #1   Title independent with advanced HEP as necessary by 03/12/15   Status On-going   PT LONG TERM GOAL #2   Title R shoulder AROM WFL and MMT 4/5 or better all planes by 03/12/15   Status On-going   PT LONG TERM GOAL #3   Title pt able to perform all ADLs and chores without limitation  by R shoulder pain or weakness by 03/12/15   Status On-going   PT LONG TERM GOAL #4   Title pt able to return recreational activities without restriction by R shoulder pain or weakness by 03/12/15   Status On-going               Plan - 03/04/15 1121    Clinical Impression Statement pt still with basically constant pain to anterior R shoulder which she states is in bicep.  Most recent surgery did nothing with bicep but surgery from earlier this year did.  She states this pain feels same as prior to surgery.  She may have some form of myofascial pain.  I discussed possible dry needling to this area and pt wants to try this. I will include this in updated POC for pt's pending MD apointment next week.   PT Next Visit Plan R Shoulder manual, ROM ,and exercises within protocol limits.   Consulted and Agree with Plan of Care Patient        Problem List Patient Active Problem List   Diagnosis Date Noted  . Chest pain 05/09/2013  . Hypertension 02/23/2012  . Hyperlipidemia 02/23/2012  . Right shoulder pain 01/25/2012  . Left wrist pain 01/16/2012  . Neck pain 12/26/2011    [name] [name] PT, OCS 03/04/2015, 11:31 AM  [facility] [address]  [address] [place], [place], [address] Phone: [phone]   Fax:  [phone]  Name: [name] [name] MRN: [id number] Date of Birth: 28-Dec-1945

## 2015-03-17 ENCOUNTER — Encounter: Payer: Self-pay | Admitting: Rehabilitation

## 2015-03-17 ENCOUNTER — Ambulatory Visit: Payer: Medicare Other | Attending: Orthopedic Surgery | Admitting: Rehabilitation

## 2015-03-17 DIAGNOSIS — M25511 Pain in right shoulder: Secondary | ICD-10-CM | POA: Insufficient documentation

## 2015-03-17 DIAGNOSIS — M25611 Stiffness of right shoulder, not elsewhere classified: Secondary | ICD-10-CM

## 2015-03-17 DIAGNOSIS — R29898 Other symptoms and signs involving the musculoskeletal system: Secondary | ICD-10-CM | POA: Insufficient documentation

## 2015-03-17 NOTE — Therapy (Signed)
[facility] [address]  [address] [place], [place], [address] Phone: [phone]   Fax:  [phone]  Physical Therapy Treatment  Patient Details  Name: [name] [name] MRN: [id number] Date of Birth: 1945-09-15 Referring Provider: [name]  Encounter Date: 03/17/2015      PT End of Session - 03/17/15 0959    Visit Number 17   Number of Visits 20   Date for PT Re-Evaluation 03/12/15   PT Start Time 0933   PT Stop Time 1024   PT Time Calculation (min) 51 min   Activity Tolerance Patient tolerated treatment well      Past Medical History  Diagnosis Date  . Allergy   . Hypertension   . Hyperlipidemia   . IBS (irritable bowel syndrome)   . Arthritis   . Full dentures   . Wears glasses     readers  . Asthma     no inhalers  . PONV (postoperative nausea and vomiting)     Past Surgical History  Procedure Laterality Date  . Shoulder surgery      left  . Elbow surgery    . Knee surgery      lt  . Carpal tunnel release      rt  . Varicose vein surgery    . Nasal septum surgery    . Abdominal hysterectomy    . Cholecystectomy    . Appendectomy    . Tonsillectomy and adenoidectomy    . Breast lumpectomy  1985    lt  . Eye surgery      both cataracts  . Wrist arthroscopy Right 09/20/2013    Procedure: RIGHT ARTHROSCOPY WRIST EXCISION PISIFORM;  Surgeon: [name], MD;  Location: [facility];  Service: Orthopedics;  Laterality: Right;  . Shoulder arthroscopy with bicepstenotomy Right 05/09/2014    Procedure: SHOULDER ARTHROSCOPY WITH BICEPSTENOTOMY;  Surgeon: [name], MD;  Location: [facility];  Service: Orthopedics;  Laterality: Right;  Debridement Rotator Cuff Tear  . Shoulder acromioplasty Right 05/09/2014    Procedure: SHOULDER ACROMIOPLASTY;  Surgeon: [name], MD;  Location: [facility];  Service: Orthopedics;  Laterality: Right;  .  Shoulder arthroscopy with rotator cuff repair Right 12/19/2014    Procedure: RIGHT SHOULDER ARTHROSCOPY WITH ROTATOR CUFF REPAIR,CALCIFIC DEBRIDEMENT EXTENSIVE;  Surgeon: [name], MD;  Location: [facility];  Service: Orthopedics;  Laterality: Right;    There were no vitals filed for this visit.  Visit Diagnosis:  Shoulder stiffness, right  Pain in joint, shoulder region, right  Shoulder weakness  Limitation of joint motion of right shoulder      Subjective Assessment - 03/17/15 0936    Subjective Had a GH injection on Friday with no real change to the pain.  Possibly some decreased pain at the neck.  Will get an MRI of the neck in 2-3 weeks   Currently in Pain? Yes   Pain Score 4    Pain Location Shoulder   Pain Orientation Right;Anterior   Aggravating Factors  reaching   Pain Relieving Factors medication, sling, rest      TODAY'S TREATMENT TherEx - UBE lvl 1.5 90"/90"  Manual - R GH grade 2 AP and caudal glides with ER, Flex, ABD mobes. PROM and AROM assessment.  TherEx - L Side-Lying R ER AROM 15x then 15x with 1# L Side-Lying R Shoulder ABD 1# with 90 elbow flexion 15x  Supine Wand Chest press with 2# cuff 15x Supine Wand Chest press with pullover 2# 10x Standing ER Yellow TB 15x Standing IR Yellow TB 15x Standing B Shoulder Extension Green TB 15x Standing low row Green TB 15x Standing belt IR Stretch/AAROM 4x10" Standing wall ball flexion 10x  Vasopneumatic compression - low pressure, 40dg, 15', R Shoulder                             PT Short Term Goals - 02/13/15 0946    PT SHORT TERM GOAL #2   Title R Shoulder PROM Flexion to 150, ABD to 120, ER to 60 at 80 ABD by 02/22/15   Status On-going           PT Long Term Goals - 02/18/15 1106    PT LONG TERM GOAL #1   Title independent with advanced HEP as necessary by 03/12/15   Status On-going   PT LONG TERM GOAL #2   Title R shoulder AROM WFL and MMT 4/5 or  better all planes by 03/12/15   Status On-going   PT LONG TERM GOAL #3   Title pt able to perform all ADLs and chores without limitation by R shoulder pain or weakness by 03/12/15   Status On-going   PT LONG TERM GOAL #4   Title pt able to return recreational activities without restriction by R shoulder pain or weakness by 03/12/15   Status On-going               Plan - 03/17/15 1000    Clinical Impression Statement no pain change with cortisone injection.  MD thinking alot of the constant pain is cervical spine related and pt will be getting MRI in 2-3 weeks.  progressing strengthening well regardless.     PT Next Visit Plan R Shoulder manual, ROM ,and exercises within protocol limits.   Dry Needling   Consulted and Agree with Plan of Care Patient        Problem List Patient Active Problem List   Diagnosis Date Noted  . Chest pain 05/09/2013  . Hypertension 02/23/2012  . Hyperlipidemia 02/23/2012  . Right shoulder pain 01/25/2012  . Left wrist pain 01/16/2012  . Neck pain 12/26/2011    [name], DPT, CMP 03/17/2015, 10:13 AM  [facility] [address]  [address] [place], [place], [address] Phone: [phone]   Fax:  [phone]  Name: [name] [name] MRN: [id number] Date of Birth: [birth year]

## 2015-03-20 ENCOUNTER — Ambulatory Visit: Payer: Medicare Other | Admitting: Physical Therapy

## 2015-03-20 DIAGNOSIS — M25611 Stiffness of right shoulder, not elsewhere classified: Secondary | ICD-10-CM

## 2015-03-20 DIAGNOSIS — R29898 Other symptoms and signs involving the musculoskeletal system: Secondary | ICD-10-CM

## 2015-03-20 DIAGNOSIS — M25511 Pain in right shoulder: Secondary | ICD-10-CM

## 2015-03-20 NOTE — Therapy (Signed)
[facility] [address]  [address] [place], [place], [address] Phone: [phone]   Fax:  [phone]  Physical Therapy Treatment  Patient Details  Name: [name] [name] MRN: [id number] Date of Birth: April 26, 1945 Referring Provider: Mckinley Jewel  Encounter Date: 03/20/2015      PT End of Session - 03/20/15 0939    Visit Number 18   Number of Visits 20   Date for PT Re-Evaluation 03/12/15   PT Start Time 0933   PT Stop Time 1025   PT Time Calculation (min) 52 min   Activity Tolerance Patient tolerated treatment well   Behavior During Therapy [facility] for tasks assessed/performed      Past Medical History  Diagnosis Date  . Allergy   . Hypertension   . Hyperlipidemia   . IBS (irritable bowel syndrome)   . Arthritis   . Full dentures   . Wears glasses     readers  . Asthma     no inhalers  . PONV (postoperative nausea and vomiting)     Past Surgical History  Procedure Laterality Date  . Shoulder surgery      left  . Elbow surgery    . Knee surgery      lt  . Carpal tunnel release      rt  . Varicose vein surgery    . Nasal septum surgery    . Abdominal hysterectomy    . Cholecystectomy    . Appendectomy    . Tonsillectomy and adenoidectomy    . Breast lumpectomy  1985    lt  . Eye surgery      both cataracts  . Wrist arthroscopy Right 09/20/2013    Procedure: RIGHT ARTHROSCOPY WRIST EXCISION PISIFORM;  Surgeon: Wyn Forster, MD;  Location: [facility];  Service: Orthopedics;  Laterality: Right;  . Shoulder arthroscopy with bicepstenotomy Right 05/09/2014    Procedure: SHOULDER ARTHROSCOPY WITH BICEPSTENOTOMY;  Surgeon: Loreta Ave, MD;  Location: [facility];  Service: Orthopedics;  Laterality: Right;  Debridement Rotator Cuff Tear  . Shoulder acromioplasty Right 05/09/2014    Procedure: SHOULDER ACROMIOPLASTY;  Surgeon: Loreta Ave, MD;  Location: [facility];  Service: Orthopedics;  Laterality: Right;  . Shoulder arthroscopy with rotator cuff repair Right 12/19/2014    Procedure: RIGHT SHOULDER ARTHROSCOPY WITH ROTATOR CUFF REPAIR,CALCIFIC DEBRIDEMENT EXTENSIVE;  Surgeon: Loreta Ave, MD;  Location: [facility];  Service: Orthopedics;  Laterality: Right;    There were no vitals filed for this visit.  Visit Diagnosis:  Shoulder stiffness, right  Pain in joint, shoulder region, right  Shoulder weakness  Limitation of joint motion of right shoulder      Subjective Assessment - 03/20/15 0936    Subjective Patient reporting some days have been better since the cortisone shot but other days not so much. Feels pain/achiness is worse on days when the weather changes.   Currently in Pain? Yes   Pain Score 5    Pain Location Shoulder  and lateral neck   Pain Orientation Right;Upper;Anterior   Pain Descriptors / Indicators Aching           TODAY'S TREATMENT  TherEx - UBE lvl 1.5 90"/90" Supine Chest Press plus with SPC and 2# 15x Supine Wand Chest press with pullover 2# 15x Supine R Shoulder CW/CCW with 5# db 15x each in 90 Flexion L Side-Lying R ER AROM 1# 15x L  Side-Lying R Shoulder ABD 1# with 90 elbow flexion 15x L Side-Lying R CW/CCW in 90 ABD 2# 15x each  Manual - Supine R GH grade 2 AP and caudal glides with ER, Flex, ABD mobes. STM to Rt upper trap in supine and sidelying  Manual stretch to upper trap in supine TPR to Rt upper trap  Vasopneumatic compression - low pressure, 38dg, 15', R Shoulder            PT Short Term Goals - 03/20/15 1013    PT SHORT TERM GOAL #2   Title R Shoulder PROM Flexion to 150, ABD to 120, ER to 60 at 80 ABD by 02/22/15   Status On-going           PT Long Term Goals - 03/20/15 1013    PT LONG TERM GOAL #1   Title independent with advanced HEP as necessary by 03/12/15   Status On-going   PT LONG TERM GOAL #2   Title R shoulder AROM WFL and MMT 4/5 or  better all planes by 03/12/15   Status On-going   PT LONG TERM GOAL #3   Title pt able to perform all ADLs and chores without limitation by R shoulder pain or weakness by 03/12/15   Status On-going   PT LONG TERM GOAL #4   Title pt able to return recreational activities without restriction by R shoulder pain or weakness by 03/12/15   Status On-going   PT LONG TERM GOAL #5   Title pt demonstate/verbalize info on physical activity to reduce risk of re-injury by 07/16/14   Status On-going               Plan - 03/20/15 1014    Clinical Impression Statement Increased lateral neck pain today with shoulder pain essentially unchanged. Significantly ttp in mid upper trap with limited response with TPR and manual stretch. Anterior shoudler pain with catching sensation still limiting overhead ROM.   PT Next Visit Plan R Shoulder manual, ROM ,and exercises within protocol limits.   Dry Needling   Consulted and Agree with Plan of Care Patient        Problem List Patient Active Problem List   Diagnosis Date Noted  . Chest pain 05/09/2013  . Hypertension 02/23/2012  . Hyperlipidemia 02/23/2012  . Right shoulder pain 01/25/2012  . Left wrist pain 01/16/2012  . Neck pain 12/26/2011    Marry GuanJoAnne M Alisa Stjames, PT, MPT  03/20/2015, 10:18 AM  [facility] [address]  [address] [place], [place], [address] Phone: [phone]   Fax:  [phone]  Name: Wellington HampshireLynn C Bigford MRN: [id number] Date of Birth: 08-04-1945

## 2015-03-25 ENCOUNTER — Ambulatory Visit: Payer: Medicare Other | Admitting: Physical Therapy

## 2015-03-25 DIAGNOSIS — M25511 Pain in right shoulder: Secondary | ICD-10-CM

## 2015-03-25 DIAGNOSIS — M25611 Stiffness of right shoulder, not elsewhere classified: Secondary | ICD-10-CM | POA: Diagnosis not present

## 2015-03-25 DIAGNOSIS — R29898 Other symptoms and signs involving the musculoskeletal system: Secondary | ICD-10-CM

## 2015-03-25 NOTE — Therapy (Signed)
[facility] [address]  [place], [place], [address] Phone: [phone]   Fax:  [phone]  Physical Therapy Treatment  Patient Details  Name: [name] [name] MRN: [id number] Date of Birth: [birth year] Referring Provider: Kathryne Hitch  Encounter Date: 03/25/2015      PT End of Session - 03/25/15 0940    Visit Number 19   Number of Visits 27   Date for PT Re-Evaluation 04/22/15   PT Start Time 0939   PT Stop Time 1038   PT Time Calculation (min) 59 min      Past Medical History  Diagnosis Date  . Allergy   . Hypertension   . Hyperlipidemia   . IBS (irritable bowel syndrome)   . Arthritis   . Full dentures   . Wears glasses     readers  . Asthma     no inhalers  . PONV (postoperative nausea and vomiting)     Past Surgical History  Procedure Laterality Date  . Shoulder surgery      left  . Elbow surgery    . Knee surgery      lt  . Carpal tunnel release      rt  . Varicose vein surgery    . Nasal septum surgery    . Abdominal hysterectomy    . Cholecystectomy    . Appendectomy    . Tonsillectomy and adenoidectomy    . Breast lumpectomy  1985    lt  . Eye surgery      both cataracts  . Wrist arthroscopy Right 09/20/2013    Procedure: RIGHT ARTHROSCOPY WRIST EXCISION PISIFORM;  Surgeon: Cammie Sickle, MD;  Location: [place];  Service: Orthopedics;  Laterality: Right;  . Shoulder arthroscopy with bicepstenotomy Right 05/09/2014    Procedure: SHOULDER ARTHROSCOPY WITH BICEPSTENOTOMY;  Surgeon: Ninetta Lights, MD;  Location: [place] [facility];  Service: Orthopedics;  Laterality: Right;  Debridement Rotator Cuff Tear  . Shoulder acromioplasty Right 05/09/2014    Procedure: SHOULDER ACROMIOPLASTY;  Surgeon: Ninetta Lights, MD;  Location: [place];  Service: Orthopedics;  Laterality: Right;  . Shoulder arthroscopy with rotator cuff repair Right 12/19/2014     Procedure: RIGHT SHOULDER ARTHROSCOPY WITH ROTATOR CUFF REPAIR,CALCIFIC DEBRIDEMENT EXTENSIVE;  Surgeon: Ninetta Lights, MD;  Location: [facility];  Service: Orthopedics;  Laterality: Right;    There were no vitals filed for this visit.  Visit Diagnosis:  Shoulder stiffness, right  Pain in joint, shoulder region, right  Shoulder weakness  Limitation of joint motion of right shoulder          [facility] PT Assessment - 03/25/15 0001    ROM / Strength   AROM / PROM / Strength AROM;PROM   AROM   Overall AROM Comments c-spine AROM symmetric R/L but pt notes tightness in R scalene area   Right Shoulder Flexion 133 Degrees  catching pain anterior shoulder   Right Shoulder ABduction 107 Degrees  anterior shoulder pain/catch   PROM   Right Shoulder Flexion 140 Degrees  upper scapular pain   Right Shoulder External Rotation 80 Degrees  at 90 ABD         TODAY'S TREATMENT: R Shoulder AROM and PROM assessment  Manual - R GH AP mobes grade 3 with stretching into ER and Flexion; TPR along with STM and gentle stretching to R pec major and minor; TPR to R UT; STM  and Stretching to R scalenes to tolerance  Vasopneumatic compression - low pressure, R shoulder, 38dg, 15'           PT Short Term Goals - 03/25/15 1043    PT SHORT TERM GOAL #2   Title R Shoulder PROM Flexion to 150, ABD to 120, ER to 60 at 80 ABD  ER goal met, Flexion to 140, ABD not re-assessed today   Status Partially Met           PT Long Term Goals - 03/25/15 1043    PT LONG TERM GOAL #1   Title independent with advanced HEP as necessary by 04/22/15   Status On-going   PT LONG TERM GOAL #2   Title R shoulder AROM WFL and MMT 4/5 or better all planes by 04/22/15   Status On-going   PT LONG TERM GOAL #3   Title pt able to perform all ADLs and chores without limitation by R shoulder pain or weakness by 04/22/15   Status On-going   PT LONG TERM GOAL #4   Title pt able to return  recreational activities without restriction by R shoulder pain or weakness by 04/21/14   Status On-going               Plan - 03/25/15 0943    Clinical Impression Statement [name] was last seen by this PT on 03/04/15.  She had MD appointment on 03/14/15.  Pt thought she had appointment with me prior to her MD appointment and plan was to perform re-assessment and send PR to MD to her f/u on 03/14/15.  Since I did not see her prior to MD appointment, no report or POC update was sent to MD.  Pt underwent injection to R shoulder on 03/14/15 and she reports insignificant benefit from that.  She reports continued difficulty sleeping due to pain and states shoulder pain rates 4-5/10 on average.  She states MD is concerned that symptoms may be related to c-spine and he advised her that he will order MRI if she continues to note pain.  Today's assessment reveals R Shoulder PROM improvements into ER (to 80 degree) and Flexion (to 140 degrees). She notes anterior shoulder pain with both of this but also noted upper scapular pain with Flexion.  R Shoulder AROM also improved (Flexion to 133 and ABD to 107) but both of these produce brief, intense, catching pain to R anterior shoulder which pt rates 10/10.  There is significant tenderness and increased tissue density/tone in R pectorals, scalenes, and upper trap compared to L.  Pt notes pain extending into R UE with deep palpation/stretching to R pectoral group and notes pain throughout R neck and into shoulder with same to R scalenes.  These tissue restrictions seem to be a component of her continued shoulder pain and limited function relate to pain.  I would like to try dry needling to her upper trapezius, lateral pectoralis, and/or into deltoid to see if this can help improve her pain and resting tone to these muscles.  In addition, may try mechanical traction to c-spine to see if this can help improve symptoms.   Pt will benefit from skilled therapeutic  intervention in order to improve on the following deficits Pain;Decreased strength;Decreased mobility;Decreased range of motion;Impaired UE functional use   Rehab Potential Good   PT Frequency 2x / week   PT Treatment/Interventions Manual techniques;Vasopneumatic Device;Taping;Passive range of motion;Patient/family education;Therapeutic exercise;Therapeutic activities;Cryotherapy;Electrical Stimulation;Dry needling;Moist Heat;Traction   PT Next Visit Plan  Begin next treatment with heat then either manual or dry needling to R scapular mms; R Shoulder ROM and stability exercises to tolerance.  May try mechanical traction to c-spine to help improve tone and tissue pliability of c-spine muscles.   Consulted and Agree with Plan of Care Patient        Problem List Patient Active Problem List   Diagnosis Date Noted  . Chest pain 05/09/2013  . Hypertension 02/23/2012  . Hyperlipidemia 02/23/2012  . Right shoulder pain 01/25/2012  . Left wrist pain 01/16/2012  . Neck pain 12/26/2011    Kaimana Lurz PT, OCS 03/25/2015, 10:49 AM  [facility] [address]  [place], [place], [address] Phone: [phone]   Fax:  [phone]  Name: [name] [name] MRN: [id number] Date of Birth: 05-Aug-1945

## 2015-03-27 ENCOUNTER — Ambulatory Visit: Payer: Medicare Other | Admitting: Physical Therapy

## 2015-03-27 DIAGNOSIS — R29898 Other symptoms and signs involving the musculoskeletal system: Secondary | ICD-10-CM

## 2015-03-27 DIAGNOSIS — M25511 Pain in right shoulder: Secondary | ICD-10-CM

## 2015-03-27 DIAGNOSIS — M25611 Stiffness of right shoulder, not elsewhere classified: Secondary | ICD-10-CM

## 2015-03-27 NOTE — Therapy (Signed)
[facility] [address]  [place], [place], [address] Phone: [phone]   Fax:  [phone]  Physical Therapy Treatment  Patient Details  Name: [name] [name] MRN: [id number] Date of Birth: [birth year] Referring Provider: Kathryne Hitch  Encounter Date: 03/27/2015      PT End of Session - 03/27/15 1204    Visit Number 20   Number of Visits 27   Date for PT Re-Evaluation 04/22/15   PT Start Time 0940   PT Stop Time 1023   PT Time Calculation (min) 43 min   Activity Tolerance Patient tolerated treatment well   Behavior During Therapy [facility] for tasks assessed/performed      Past Medical History  Diagnosis Date  . Allergy   . Hypertension   . Hyperlipidemia   . IBS (irritable bowel syndrome)   . Arthritis   . Full dentures   . Wears glasses     readers  . Asthma     no inhalers  . PONV (postoperative nausea and vomiting)     Past Surgical History  Procedure Laterality Date  . Shoulder surgery      left  . Elbow surgery    . Knee surgery      lt  . Carpal tunnel release      rt  . Varicose vein surgery    . Nasal septum surgery    . Abdominal hysterectomy    . Cholecystectomy    . Appendectomy    . Tonsillectomy and adenoidectomy    . Breast lumpectomy  1985    lt  . Eye surgery      both cataracts  . Wrist arthroscopy Right 09/20/2013    Procedure: RIGHT ARTHROSCOPY WRIST EXCISION PISIFORM;  Surgeon: Cammie Sickle, MD;  Location: [place];  Service: Orthopedics;  Laterality: Right;  . Shoulder arthroscopy with bicepstenotomy Right 05/09/2014    Procedure: SHOULDER ARTHROSCOPY WITH BICEPSTENOTOMY;  Surgeon: Ninetta Lights, MD;  Location: [place];  Service: Orthopedics;  Laterality: Right;  Debridement Rotator Cuff Tear  . Shoulder acromioplasty Right 05/09/2014    Procedure: SHOULDER ACROMIOPLASTY;  Surgeon: Ninetta Lights, MD;  Location: [place];  Service: Orthopedics;  Laterality: Right;  . Shoulder arthroscopy with rotator cuff repair Right 12/19/2014    Procedure: RIGHT SHOULDER ARTHROSCOPY WITH ROTATOR CUFF REPAIR,CALCIFIC DEBRIDEMENT EXTENSIVE;  Surgeon: Ninetta Lights, MD;  Location: [place];  Service: Orthopedics;  Laterality: Right;    There were no vitals filed for this visit.  Visit Diagnosis:  Shoulder stiffness, right  Pain in joint, shoulder region, right  Shoulder weakness  Limitation of joint motion of right shoulder      Subjective Assessment - 03/27/15 0942    Subjective Had pain in R jaw after last session; concerned about manual in neck caused pain but not completely sure.   Patient Stated Goals "get back to normal"   Currently in Pain? Yes   Pain Score 4    Pain Location Shoulder   Pain Orientation Right;Anterior;Upper   Pain Descriptors / Indicators Aching;Nagging   Pain Onset More than a month ago   Pain Frequency Intermittent   Aggravating Factors  reaching   Pain Relieving Factors meds, sling, rest            [facility] PT Assessment - 03/27/15 0955    Observation/Other Assessments   Focus on Therapeutic Outcomes (FOTO)  55 (  45% limited)                     [facility] Adult PT Treatment/Exercise - 04-17-15 0944    Exercises   Exercises Shoulder   Shoulder Exercises: ROM/Strengthening   UBE (Upper Arm Bike) L 1.0 x 4 min   Modalities   Modalities Moist Heat   Moist Heat Therapy   Number Minutes Moist Heat 10 Minutes   Moist Heat Location Shoulder  before manual   Manual Therapy   Manual Therapy Soft tissue mobilization;Myofascial release;Manual Traction   Soft tissue mobilization To Rt pec, bicep area   Myofascial Release R pec and UT   Manual Traction cervical spine x 5 min; no change in symptoms during; did not increase pain                PT Education - April 17, 2015 1203    Education provided Yes   Education Details notify MD of reaction  from vaccines yesterday (pt with large red area around injection site)   Person(s) Educated Patient   Methods Explanation   Comprehension Verbalized understanding          PT Short Term Goals - 03/25/15 1043    PT SHORT TERM GOAL #2   Title R Shoulder PROM Flexion to 150, ABD to 120, ER to 60 at 80 ABD  ER goal met, Flexion to 140, ABD not re-assessed today   Status Partially Met           PT Long Term Goals - 03/25/15 1043    PT LONG TERM GOAL #1   Title independent with advanced HEP as necessary by 04/22/15   Status On-going   PT LONG TERM GOAL #2   Title R shoulder AROM WFL and MMT 4/5 or better all planes by 04/22/15   Status On-going   PT LONG TERM GOAL #3   Title pt able to perform all ADLs and chores without limitation by R shoulder pain or weakness by 04/22/15   Status On-going   PT LONG TERM GOAL #4   Title pt able to return recreational activities without restriction by R shoulder pain or weakness by 04/21/14   Status On-going               Plan - 2015-04-17 1204    Clinical Impression Statement Increased pain in jaw and surrounding musculature following last session with tenderness to palpation along SCM and masseter.  Deferred manual to SCM and scalenes today due to increased pain and tenderness and advised to eat soft foods until pain subsided.  Pt verbalized understanding.  Session focused mainly on muscle tightness and manual therapy.  May try mechanical traction next session to see if pt has decrease in symptoms.   PT Next Visit Plan Begin next treatment with heat then either manual or dry needling to R scapular mms; R Shoulder ROM and stability exercises to tolerance.  May try mechanical traction to c-spine to help improve tone and tissue pliability of c-spine muscles.   Consulted and Agree with Plan of Care Patient          G-Codes - April 17, 2015 06/19/1205    Functional Assessment Tool Used FOTO 45% limited   Functional Limitation Carrying, moving and handling  objects   Carrying, Moving and Handling Objects Current Status (K5537) At least 40 percent but less than 60 percent impaired, limited or restricted   Carrying, Moving and Handling Objects Goal Status (S8270) At least 40 percent but  less than 60 percent impaired, limited or restricted      Problem List Patient Active Problem List   Diagnosis Date Noted  . Chest pain 05/09/2013  . Hypertension 02/23/2012  . Hyperlipidemia 02/23/2012  . Right shoulder pain 01/25/2012  . Left wrist pain 01/16/2012  . Neck pain 12/26/2011   Laureen Abrahams, PT, DPT 03/27/2015 12:08 PM  [facility] [address]  [place], [place], [address] Phone: [phone]   Fax:  [phone]  Name: LINNEA TODISCO MRN: [id number] Date of Birth: 1945/12/25

## 2015-03-31 ENCOUNTER — Other Ambulatory Visit: Payer: Self-pay | Admitting: Orthopedic Surgery

## 2015-03-31 DIAGNOSIS — M25511 Pain in right shoulder: Secondary | ICD-10-CM

## 2015-03-31 DIAGNOSIS — M25512 Pain in left shoulder: Principal | ICD-10-CM

## 2015-04-01 ENCOUNTER — Ambulatory Visit: Payer: Medicare Other | Admitting: Physical Therapy

## 2015-04-01 DIAGNOSIS — R29898 Other symptoms and signs involving the musculoskeletal system: Secondary | ICD-10-CM

## 2015-04-01 DIAGNOSIS — M25611 Stiffness of right shoulder, not elsewhere classified: Secondary | ICD-10-CM | POA: Diagnosis not present

## 2015-04-01 DIAGNOSIS — M25511 Pain in right shoulder: Secondary | ICD-10-CM

## 2015-04-01 NOTE — Therapy (Signed)
[facility] [address]  [place], [place], [address] Phone: [phone]   Fax:  [phone]  Physical Therapy Treatment  Patient Details  Name: [name] [name] MRN: [id number] Date of Birth: Mar 18, 1946 Referring Provider: Kathryne Hitch  Encounter Date: 04/01/2015      PT End of Session - 04/01/15 0938    Visit Number 21   Number of Visits 27   Date for PT Re-Evaluation 04/22/15   PT Start Time 0936   PT Stop Time 9735   PT Time Calculation (min) 38 min      Past Medical History  Diagnosis Date  . Allergy   . Hypertension   . Hyperlipidemia   . IBS (irritable bowel syndrome)   . Arthritis   . Full dentures   . Wears glasses     readers  . Asthma     no inhalers  . PONV (postoperative nausea and vomiting)     Past Surgical History  Procedure Laterality Date  . Shoulder surgery      left  . Elbow surgery    . Knee surgery      lt  . Carpal tunnel release      rt  . Varicose vein surgery    . Nasal septum surgery    . Abdominal hysterectomy    . Cholecystectomy    . Appendectomy    . Tonsillectomy and adenoidectomy    . Breast lumpectomy  1985    lt  . Eye surgery      both cataracts  . Wrist arthroscopy Right 09/20/2013    Procedure: RIGHT ARTHROSCOPY WRIST EXCISION PISIFORM;  Surgeon: Cammie Sickle, MD;  Location: [facility];  Service: Orthopedics;  Laterality: Right;  . Shoulder arthroscopy with bicepstenotomy Right 05/09/2014    Procedure: SHOULDER ARTHROSCOPY WITH BICEPSTENOTOMY;  Surgeon: Ninetta Lights, MD;  Location: [place];  Service: Orthopedics;  Laterality: Right;  Debridement Rotator Cuff Tear  . Shoulder acromioplasty Right 05/09/2014    Procedure: SHOULDER ACROMIOPLASTY;  Surgeon: Ninetta Lights, MD;  Location: [facility] [place];  Service: Orthopedics;  Laterality: Right;  . Shoulder arthroscopy with rotator cuff repair Right 12/19/2014     Procedure: RIGHT SHOULDER ARTHROSCOPY WITH ROTATOR CUFF REPAIR,CALCIFIC DEBRIDEMENT EXTENSIVE;  Surgeon: Ninetta Lights, MD;  Location: [place];  Service: Orthopedics;  Laterality: Right;    There were no vitals filed for this visit.  Visit Diagnosis:  Shoulder stiffness, right  Pain in joint, shoulder region, right  Shoulder weakness  Limitation of joint motion of right shoulder      Subjective Assessment - 04/01/15 0937    Subjective States jaw pain has improved and is only noted intermittently and less intense.  She has c-spine MRI scheduled for 04/20/15.  States is noting more of a catching sensation in R shoulder lately.   Currently in Pain? Yes   Pain Score 5    Pain Location Shoulder   Pain Orientation Right;Lateral;Upper;Anterior             TODAY'S TREATMENT Manual - IASTM throughout R deltoid and lateral pectoral along clavicle and into shoulder, then some IASTM into lateral aspect of infraspinatus.  MFR/stretching R shoulder/scapula into retraction with pt supine.  R UE nerve glides.  TherEx - L side-lying R ER AROM 15x (notes some mild pain into neck). Supine R Shoulder AAROM flexion 15x Standing hand on wall shoulder flexion  slide 15x            PT Short Term Goals - 03/25/15 1043    PT SHORT TERM GOAL #2   Title R Shoulder PROM Flexion to 150, ABD to 120, ER to 60 at 80 ABD  ER goal met, Flexion to 140, ABD not re-assessed today   Status Partially Met           PT Long Term Goals - 03/25/15 1043    PT LONG TERM GOAL #1   Title independent with advanced HEP as necessary by 04/22/15   Status On-going   PT LONG TERM GOAL #2   Title R shoulder AROM WFL and MMT 4/5 or better all planes by 04/22/15   Status On-going   PT LONG TERM GOAL #3   Title pt able to perform all ADLs and chores without limitation by R shoulder pain or weakness by 04/22/15   Status On-going   PT LONG TERM GOAL #4   Title pt able to return recreational  activities without restriction by R shoulder pain or weakness by 04/21/14   Status On-going               Plan - 04/01/15 1010    Clinical Impression Statement pt reports jaw pain improved but not much improvement with regard to R shoulder funciton.  Performed some IASTM today for MFR along with fascia and soft tissue stretching into scap retraction.  No needling today due to continued pain in jaw area and pt with apprehension about treatments to upper scapular muscles.  I would like to perform needling to upper trap along with other scapular muscles as necessary but upper trap seems most likely target today.   PT Next Visit Plan possible needling to UT and other scapular mms or deltoid region.   Consulted and Agree with Plan of Care Patient        Problem List Patient Active Problem List   Diagnosis Date Noted  . Chest pain 05/09/2013  . Hypertension 02/23/2012  . Hyperlipidemia 02/23/2012  . Right shoulder pain 01/25/2012  . Left wrist pain 01/16/2012  . Neck pain 12/26/2011    Tegan Burnside PT, OCS 04/01/2015, 10:17 AM  [facility] [address]  [place], [place], [address] Phone: [phone]   Fax:  [phone]  Name: [name] [name] MRN: [id number] Date of Birth: 02/11/1946

## 2015-04-03 ENCOUNTER — Ambulatory Visit: Payer: Medicare Other

## 2015-04-08 ENCOUNTER — Ambulatory Visit: Payer: Medicare Other | Admitting: Physical Therapy

## 2015-04-09 ENCOUNTER — Ambulatory Visit: Payer: Medicare Other | Admitting: Physical Therapy

## 2015-04-09 DIAGNOSIS — R29898 Other symptoms and signs involving the musculoskeletal system: Secondary | ICD-10-CM

## 2015-04-09 DIAGNOSIS — M25611 Stiffness of right shoulder, not elsewhere classified: Secondary | ICD-10-CM | POA: Diagnosis not present

## 2015-04-09 DIAGNOSIS — M25511 Pain in right shoulder: Secondary | ICD-10-CM

## 2015-04-09 NOTE — Therapy (Signed)
[facility] [address]  [place], [place], [address] Phone: [phone]   Fax:  [phone]  Physical Therapy Treatment  Patient Details  Name: [name] [name] MRN: [id number] Date of Birth: 1945/10/24 Referring Provider: Kathryne Hitch  Encounter Date: 04/09/2015      PT End of Session - 04/09/15 1414    Visit Number 22   Number of Visits 27   Date for PT Re-Evaluation 04/22/15   PT Start Time 2707   PT Stop Time 1458   PT Time Calculation (min) 46 min      Past Medical History  Diagnosis Date  . Allergy   . Hypertension   . Hyperlipidemia   . IBS (irritable bowel syndrome)   . Arthritis   . Full dentures   . Wears glasses     readers  . Asthma     no inhalers  . PONV (postoperative nausea and vomiting)     Past Surgical History  Procedure Laterality Date  . Shoulder surgery      left  . Elbow surgery    . Knee surgery      lt  . Carpal tunnel release      rt  . Varicose vein surgery    . Nasal septum surgery    . Abdominal hysterectomy    . Cholecystectomy    . Appendectomy    . Tonsillectomy and adenoidectomy    . Breast lumpectomy  1985    lt  . Eye surgery      both cataracts  . Wrist arthroscopy Right 09/20/2013    Procedure: RIGHT ARTHROSCOPY WRIST EXCISION PISIFORM;  Surgeon: Cammie Sickle, MD;  Location: [place];  Service: Orthopedics;  Laterality: Right;  . Shoulder arthroscopy with bicepstenotomy Right 05/09/2014    Procedure: SHOULDER ARTHROSCOPY WITH BICEPSTENOTOMY;  Surgeon: Ninetta Lights, MD;  Location: [place];  Service: Orthopedics;  Laterality: Right;  Debridement Rotator Cuff Tear  . Shoulder acromioplasty Right 05/09/2014    Procedure: SHOULDER ACROMIOPLASTY;  Surgeon: Ninetta Lights, MD;  Location: [place];  Service: Orthopedics;  Laterality: Right;  . Shoulder arthroscopy with rotator cuff repair Right 12/19/2014     Procedure: RIGHT SHOULDER ARTHROSCOPY WITH ROTATOR CUFF REPAIR,CALCIFIC DEBRIDEMENT EXTENSIVE;  Surgeon: Ninetta Lights, MD;  Location: [place];  Service: Orthopedics;  Laterality: Right;    There were no vitals filed for this visit.  Visit Diagnosis:  Shoulder stiffness, right  Pain in joint, shoulder region, right  Shoulder weakness  Limitation of joint motion of right shoulder      Subjective Assessment - 04/09/15 1414    Subjective Returned from out of town yesterday evening.  States has noted worsened pain in lateral and anterior shoulder with shoulder ABD AROM. Denies using R UE with luggage and states does not know why increased pain.   Currently in Pain? Yes   Pain Score --  6-7/10 on AVG lately, up to 10/10 with ABD AROM   Pain Location Shoulder   Pain Orientation Right;Anterior;Lateral            [facility] PT Assessment - 04/09/15 0001    AROM   Right Shoulder Flexion 105 Degrees  pain   Right Shoulder ABduction 85 Degrees  pain          TODAY'S TREATMENT Manual - dry needling (verbal informed consent provided prior to treatment) performed with pt supine  to R lateral and posterior deltoid with twitch response noted in lateral delt.  Then with pt L side-lying performed needling to R infraspinatus muscle belly due to tone and tenderness noted here.  Pt noted pain extend into hand with needling to infraspinatus.  Following dry needling, performed STM to R infraspinatus and then R GH AP mobs grade 2 along with gentle PROM to tolerance.                     PT Short Term Goals - 03/25/15 1043    PT SHORT TERM GOAL #2   Title R Shoulder PROM Flexion to 150, ABD to 120, ER to 60 at 80 ABD  ER goal met, Flexion to 140, ABD not re-assessed today   Status Partially Met           PT Long Term Goals - 03/25/15 1043    PT LONG TERM GOAL #1   Title independent with advanced HEP as necessary by 04/22/15   Status On-going   PT  LONG TERM GOAL #2   Title R shoulder AROM WFL and MMT 4/5 or better all planes by 04/22/15   Status On-going   PT LONG TERM GOAL #3   Title pt able to perform all ADLs and chores without limitation by R shoulder pain or weakness by 04/22/15   Status On-going   PT LONG TERM GOAL #4   Title pt able to return recreational activities without restriction by R shoulder pain or weakness by 04/21/14   Status On-going               Plan - 04/09/15 1603    Clinical Impression Statement poor progress lately.  Increased pain since out of town trip but does not know why.  States did not use R UE to handle luggage but R shoulder AROM worse since last week.  Trial dry needling performed today to deltoid and infraspinatus.  Pt noted pain extend to fingers with needling to infraspinatus which may related to her c/o pain extending to hand at times.  Following needling, noted a clicking sensation in R shoulder with gently AP mobs in 90ish degrees ABD.  No c/o pain with this but could indicate scar tissue or other internal derangement contributing to her increased pain with ABD.  Advised pt to not push into her pain with HEP and AROM activities because she had made statement that she had tried to ignore pain and just keep pushing her shoulder at times.   PT Next Visit Plan re-assess pain and ROM; possible needling again? otherwise manual for ROM and pain and return to exercise as able.   Consulted and Agree with Plan of Care Patient        Problem List Patient Active Problem List   Diagnosis Date Noted  . Chest pain 05/09/2013  . Hypertension 02/23/2012  . Hyperlipidemia 02/23/2012  . Right shoulder pain 01/25/2012  . Left wrist pain 01/16/2012  . Neck pain 12/26/2011    Semone Orlov PT, OCS 04/09/2015, 4:08 PM  [facility] [address]  [place], [place], [address] Phone: [phone]   Fax:  [phone]  Name: [name] [name] MRN:  [id number] Date of Birth: [birth year]

## 2015-04-10 ENCOUNTER — Ambulatory Visit: Payer: Medicare Other | Admitting: Physical Therapy

## 2015-04-16 ENCOUNTER — Other Ambulatory Visit: Payer: Medicare Other

## 2015-04-17 ENCOUNTER — Ambulatory Visit: Payer: Medicare Other | Attending: Orthopedic Surgery | Admitting: Physical Therapy

## 2015-04-17 DIAGNOSIS — M25511 Pain in right shoulder: Secondary | ICD-10-CM | POA: Diagnosis present

## 2015-04-17 DIAGNOSIS — R29898 Other symptoms and signs involving the musculoskeletal system: Secondary | ICD-10-CM

## 2015-04-17 DIAGNOSIS — M25611 Stiffness of right shoulder, not elsewhere classified: Secondary | ICD-10-CM | POA: Diagnosis present

## 2015-04-17 DIAGNOSIS — M542 Cervicalgia: Secondary | ICD-10-CM | POA: Diagnosis present

## 2015-04-17 NOTE — Therapy (Signed)
[facility] [address]  [place], [place], [address] Phone: [phone]   Fax:  [phone]  Physical Therapy Treatment  Patient Details  Name: [name] [name] MRN: [id number] Date of Birth: 06-15-1945 Referring Provider: Kathryne Hitch  Encounter Date: 04/17/2015      PT End of Session - 04/17/15 1022    Visit Number 23   Number of Visits 27   Date for PT Re-Evaluation 04/22/15   PT Start Time 1020   PT Stop Time 1102   PT Time Calculation (min) 42 min      Past Medical History  Diagnosis Date  . Allergy   . Hypertension   . Hyperlipidemia   . IBS (irritable bowel syndrome)   . Arthritis   . Full dentures   . Wears glasses     readers  . Asthma     no inhalers  . PONV (postoperative nausea and vomiting)     Past Surgical History  Procedure Laterality Date  . Shoulder surgery      left  . Elbow surgery    . Knee surgery      lt  . Carpal tunnel release      rt  . Varicose vein surgery    . Nasal septum surgery    . Abdominal hysterectomy    . Cholecystectomy    . Appendectomy    . Tonsillectomy and adenoidectomy    . Breast lumpectomy  1985    lt  . Eye surgery      both cataracts  . Wrist arthroscopy Right 09/20/2013    Procedure: RIGHT ARTHROSCOPY WRIST EXCISION PISIFORM;  Surgeon: Cammie Sickle, MD;  Location: [place];  Service: Orthopedics;  Laterality: Right;  . Shoulder arthroscopy with bicepstenotomy Right 05/09/2014    Procedure: SHOULDER ARTHROSCOPY WITH BICEPSTENOTOMY;  Surgeon: Ninetta Lights, MD;  Location: [place];  Service: Orthopedics;  Laterality: Right;  Debridement Rotator Cuff Tear  . Shoulder acromioplasty Right 05/09/2014    Procedure: SHOULDER ACROMIOPLASTY;  Surgeon: Ninetta Lights, MD;  Location: [facility];  Service: Orthopedics;  Laterality: Right;  . Shoulder arthroscopy with rotator cuff repair Right 12/19/2014    Procedure: RIGHT SHOULDER ARTHROSCOPY WITH ROTATOR CUFF REPAIR,CALCIFIC DEBRIDEMENT EXTENSIVE;  Surgeon: Ninetta Lights, MD;  Location: [place];  Service: Orthopedics;  Laterality: Right;    There were no vitals filed for this visit.  Visit Diagnosis:  Shoulder stiffness, right  Pain in joint, shoulder region, right  Shoulder weakness  Limitation of joint motion of right shoulder      Subjective Assessment - 04/17/15 1020    Subjective "it's the same", 4-5/10 with reaching.  States is able to perform rowing exercises well with HEP but notes anterior shoulder and anterior neck pain with shoulder flexion type exercises. Denies noting benefit with dry needling performed last treatment   Currently in Pain? Yes   Pain Score --  4-5/10   Pain Location Shoulder   Pain Orientation Right;Anterior;Lateral         TODAY'S TREATMENT  Manual - x-hand pectoral/abdominal MFR; R GH grade 2 AP mobs; STM/MFR R scalenes and UT; contract/relax into ER at 80ish ABD; mobs into IR with distraction  TherEx - L side-lying R ER AROM 20x [place] Flexion AAROM 20x (states still feels pulling to anterior R neck with this and palpable high tone in scalenes)  PT Short Term Goals - 03/25/15 1043    PT SHORT TERM GOAL #2   Title R Shoulder PROM Flexion to 150, ABD to 120, ER to 60 at 80 ABD  ER goal met, Flexion to 140, ABD not re-assessed today   Status Partially Met           PT Long Term Goals - 03/25/15 1043    PT LONG TERM GOAL #1   Title independent with advanced HEP as necessary by 04/22/15   Status On-going   PT LONG TERM GOAL #2   Title R shoulder AROM WFL and MMT 4/5 or better all planes by 04/22/15   Status On-going   PT LONG TERM GOAL #3   Title pt able to perform all ADLs and chores without limitation by R shoulder pain or weakness by 04/22/15   Status On-going   PT LONG TERM GOAL #4   Title pt able to return recreational activities without  restriction by R shoulder pain or weakness by 04/21/14   Status On-going               Plan - 04/17/15 1106    Clinical Impression Statement no changes in symptoms.  Anterior shoulder and neck pain with shoulder flexion; frequent catching sensation to R anterior shoulder with AROM.  High tone noted in scalenes and attempted to treat with manual but still present with Flexion AAROM.  May try 1st rib mobes and side-lying exercises next treatment.   PT Next Visit Plan assess shoulder PROM; manual to shoulder and scapular mms and AROM / AAROM as able; 1st rib mobs?; more L side-lying exercises as able   Consulted and Agree with Plan of Care Patient        Problem List Patient Active Problem List   Diagnosis Date Noted  . Chest pain 05/09/2013  . Hypertension 02/23/2012  . Hyperlipidemia 02/23/2012  . Right shoulder pain 01/25/2012  . Left wrist pain 01/16/2012  . Neck pain 12/26/2011    Kunta Hilleary PT, OCS 04/17/2015, 11:10 AM  [facility] [address]  [place], [place], [address] Phone: [phone]   Fax:  [phone]  Name: [name] [name] MRN: [id number] Date of Birth: Jun 16, 1945

## 2015-04-20 ENCOUNTER — Other Ambulatory Visit: Payer: Medicare Other

## 2015-04-21 ENCOUNTER — Ambulatory Visit: Payer: Medicare Other | Admitting: Physical Therapy

## 2015-04-23 ENCOUNTER — Ambulatory Visit: Payer: Medicare Other | Admitting: Physical Therapy

## 2015-04-23 DIAGNOSIS — M25611 Stiffness of right shoulder, not elsewhere classified: Secondary | ICD-10-CM | POA: Diagnosis not present

## 2015-04-23 DIAGNOSIS — M25511 Pain in right shoulder: Secondary | ICD-10-CM

## 2015-04-23 DIAGNOSIS — R29898 Other symptoms and signs involving the musculoskeletal system: Secondary | ICD-10-CM

## 2015-04-23 NOTE — Therapy (Signed)
[facility] [place] [address]  [address] [place], [place], [address] Phone: [phone]   Fax:  [phone]  Physical Therapy Treatment  Patient Details  Name: [name] [name] MRN: [id number] Date of Birth: 11/27/1945 Referring Provider: Daniel Murphy  Encounter Date: 04/23/2015      PT End of Session - 04/23/15 1026    Visit Number 24   Number of Visits 27   PT Start Time 1025   PT Stop Time 1107   PT Time Calculation (min) 42 min      Past Medical History  Diagnosis Date  . Allergy   . Hypertension   . Hyperlipidemia   . IBS (irritable bowel syndrome)   . Arthritis   . Full dentures   . Wears glasses     readers  . Asthma     no inhalers  . PONV (postoperative nausea and vomiting)     Past Surgical History  Procedure Laterality Date  . Shoulder surgery      left  . Elbow surgery    . Knee surgery      lt  . Carpal tunnel release      rt  . Varicose vein surgery    . Nasal septum surgery    . Abdominal hysterectomy    . Cholecystectomy    . Appendectomy    . Tonsillectomy and adenoidectomy    . Breast lumpectomy  1985    lt  . Eye surgery      both cataracts  . Wrist arthroscopy Right 09/20/2013    Procedure: RIGHT ARTHROSCOPY WRIST EXCISION PISIFORM;  Surgeon: Robert Sypher Jr V, MD;  Location: [facility];  Service: Orthopedics;  Laterality: Right;  . Shoulder arthroscopy with bicepstenotomy Right 05/09/2014    Procedure: SHOULDER ARTHROSCOPY WITH BICEPSTENOTOMY;  Surgeon: Daniel F Murphy, MD;  Location: [facility];  Service: Orthopedics;  Laterality: Right;  Debridement Rotator Cuff Tear  . Shoulder acromioplasty Right 05/09/2014    Procedure: SHOULDER ACROMIOPLASTY;  Surgeon: Daniel F Murphy, MD;  Location: [facility];  Service: Orthopedics;  Laterality: Right;  . Shoulder arthroscopy with rotator cuff repair Right 12/19/2014    Procedure: RIGHT SHOULDER  ARTHROSCOPY WITH ROTATOR CUFF REPAIR,CALCIFIC DEBRIDEMENT EXTENSIVE;  Surgeon: Daniel F Murphy, MD;  Location: [facility];  Service: Orthopedics;  Laterality: Right;    There were no vitals filed for this visit.  Visit Diagnosis:  Shoulder stiffness, right  Pain in joint, shoulder region, right  Shoulder weakness  Limitation of joint motion of right shoulder      Subjective Assessment - 04/23/15 1026    Subjective States wasn't able to have MRI performed this past weekend due to snow.  She is rescheduled for this coming weekend.  States shoulder still painful with stabbing pain with reaching forward.  States HEP is improving with exercises that do not require shoulder elevation.  Difficulty sleeping due to pain.   Currently in Pain? Yes   Pain Score --  6/10 on AVG lately   Pain Location Shoulder   Pain Orientation Right   Pain Descriptors / Indicators Aching;Nagging;Sharp;Stabbing   Aggravating Factors  reaching            TODAY'S TREATMENT  Manual - x-hand pectoral/abdominal MFR; R GH grade 2 AP mobs along with stretching into ER and IR to tolerance; STM/MFR R scalenes and UT,R 1st rib mobs (tender here)  TherEx - L side-lying R   ER AROM 20x Attempted hooklying B horiz ABD Red TB but stopped after 4 reps due to pain  Attempted side lying R shoulder ABD AROM but unable due to pain Hand on wall shoulder flexion slide AAROM 10x (last 2 reps did not require assist and no pain)           PT Short Term Goals - 03/25/15 1043    PT SHORT TERM GOAL #2   Title R Shoulder PROM Flexion to 150, ABD to 120, ER to 60 at 80 ABD  ER goal met, Flexion to 140, ABD not re-assessed today   Status Partially Met           PT Long Term Goals - 03/25/15 1043    PT LONG TERM GOAL #1   Title independent with advanced HEP as necessary by 04/22/15   Status On-going   PT LONG TERM GOAL #2   Title R shoulder AROM WFL and MMT 4/5 or better all planes by 04/22/15    Status On-going   PT LONG TERM GOAL #3   Title pt able to perform all ADLs and chores without limitation by R shoulder pain or weakness by 04/22/15   Status On-going   PT LONG TERM GOAL #4   Title pt able to return recreational activities without restriction by R shoulder pain or weakness by 04/21/14   Status On-going               Plan - 04/23/15 1211    Clinical Impression Statement pt was able to perform shoulder flexion wall slides without assist after offering assist for first several reps which is something she has not been able to perform for some time.  Was unable to perform sid-lying shoulder ABD due to lateral shoulder pain so there is concern for re-injury to supraspinatus but hopefully her shoulder pain is more scar tissue in nature.  Performed scar massage to anterior shoulder as this seems to be main reason for anterior shoulder pain because pain is so superficial and right at surgical port.  R scalenes tight vs L and noted some radicular symptoms into R UE while stretching scalenes so her radicular symptoms seem TOS in nature rather than true c-spine but she will have c-spine MRI in a few days to rule in/out neck issues.   PT Next Visit Plan assess shoulder ROM for PR to MD; continue scalene work and 1st rib mobs; more L side-lying exercises as able   Consulted and Agree with Plan of Care Patient        Problem List Patient Active Problem List   Diagnosis Date Noted  . Chest pain 05/09/2013  . Hypertension 02/23/2012  . Hyperlipidemia 02/23/2012  . Right shoulder pain 01/25/2012  . Left wrist pain 01/16/2012  . Neck pain 12/26/2011    HALL,RALPH PT, OCS 04/23/2015, 12:18 PM  [facility] [place] [address]  [address] [place], [place], [address] Phone: [phone]   Fax:  [phone]  Name: [name] [name] MRN: [id number] Date of Birth: 11/17/1945     

## 2015-04-27 ENCOUNTER — Ambulatory Visit
Admission: RE | Admit: 2015-04-27 | Discharge: 2015-04-27 | Disposition: A | Discharge status: Home / Self Care | Payer: Medicare Other | Source: Ambulatory Visit | Attending: Orthopedic Surgery | Admitting: Orthopedic Surgery

## 2015-04-27 DIAGNOSIS — M25512 Pain in left shoulder: Principal | ICD-10-CM

## 2015-04-27 DIAGNOSIS — M25511 Pain in right shoulder: Secondary | ICD-10-CM

## 2015-04-28 ENCOUNTER — Ambulatory Visit: Payer: Medicare Other | Admitting: Physical Therapy

## 2015-04-28 DIAGNOSIS — M25511 Pain in right shoulder: Secondary | ICD-10-CM

## 2015-04-28 DIAGNOSIS — M25611 Stiffness of right shoulder, not elsewhere classified: Secondary | ICD-10-CM | POA: Diagnosis not present

## 2015-04-28 DIAGNOSIS — M542 Cervicalgia: Secondary | ICD-10-CM

## 2015-04-28 DIAGNOSIS — R29898 Other symptoms and signs involving the musculoskeletal system: Secondary | ICD-10-CM

## 2015-04-28 NOTE — Therapy (Signed)
[facility] [place] [address]  [place], [place], [address] Phone: [phone]   Fax:  [phone]  Physical Therapy Treatment  Patient Details  Name: [name] [name] MRN: [id number] Date of Birth: 09/10/1945 Referring Provider: Kathryne Hitch  Encounter Date: 04/28/2015      PT End of Session - 04/28/15 1024    Visit Number 25   Number of Visits 33   Date for PT Re-Evaluation 05/26/15   PT Start Time 6962   PT Stop Time 1107   PT Time Calculation (min) 44 min      Past Medical History  Diagnosis Date  . Allergy   . Hypertension   . Hyperlipidemia   . IBS (irritable bowel syndrome)   . Arthritis   . Full dentures   . Wears glasses     readers  . Asthma     no inhalers  . PONV (postoperative nausea and vomiting)     Past Surgical History  Procedure Laterality Date  . Shoulder surgery      left  . Elbow surgery    . Knee surgery      lt  . Carpal tunnel release      rt  . Varicose vein surgery    . Nasal septum surgery    . Abdominal hysterectomy    . Cholecystectomy    . Appendectomy    . Tonsillectomy and adenoidectomy    . Breast lumpectomy  1985    lt  . Eye surgery      both cataracts  . Wrist arthroscopy Right 09/20/2013    Procedure: RIGHT ARTHROSCOPY WRIST EXCISION PISIFORM;  Surgeon: Cammie Sickle, MD;  Location: [employer] [facility];  Service: Orthopedics;  Laterality: Right;  . Shoulder arthroscopy with bicepstenotomy Right 05/09/2014    Procedure: SHOULDER ARTHROSCOPY WITH BICEPSTENOTOMY;  Surgeon: Ninetta Lights, MD;  Location: [facility];  Service: Orthopedics;  Laterality: Right;  Debridement Rotator Cuff Tear  . Shoulder acromioplasty Right 05/09/2014    Procedure: SHOULDER ACROMIOPLASTY;  Surgeon: Ninetta Lights, MD;  Location: [facility];  Service: Orthopedics;  Laterality: Right;  . Shoulder arthroscopy with rotator cuff repair Right 12/19/2014     Procedure: RIGHT SHOULDER ARTHROSCOPY WITH ROTATOR CUFF REPAIR,CALCIFIC DEBRIDEMENT EXTENSIVE;  Surgeon: Ninetta Lights, MD;  Location: [facility];  Service: Orthopedics;  Laterality: Right;    There were no vitals filed for this visit.  Visit Diagnosis:  Shoulder stiffness, right  Pain in joint, shoulder region, right  Shoulder weakness  Limitation of joint motion of right shoulder  Neck pain      Subjective Assessment - 04/28/15 1039    Subjective States went for MRI to c-spine yesterday.  States was in great deal of pain following this and difficulty sleeping last night due to pain.  States pain 6-7/10 today but was 9/10 last night.   Diagnostic tests MRI yesterday indicates C6 nerve root impingement   Currently in Pain? Yes   Pain Score --  6-7/10   Pain Location Shoulder   Pain Orientation Right   Pain Radiating Towards R lateral neck and into lateral shoulder.  At times states pain extends to R thumb            [name] [name] PT Assessment - 04/28/15 0001    AROM   AROM Assessment Site Cervical   Cervical Extension 45  R anterior neck pulling   Cervical -  Right Side Bend 25  mild R neck pain   Cervical - Left Side Bend 28  R neck tightness   Cervical - Right Rotation 54  R neck tightness   Cervical - Left Rotation 51  R neck tightness   PROM   Right Shoulder Flexion 135 Degrees   Right Shoulder ABduction 110 Degrees   Right Shoulder Internal Rotation 63 Degrees   Right Shoulder External Rotation 80 Degrees     TODAY'S TREATMENT  Manual - x-hand pectoral/abdominal MFR; R GH grade 2 AP mobs along with stretching into ER, ABD, Flexion, and IR to tolerance; R UE Nerve Glides (no symptoms); TPR R Pec due to high tone and tenderness noted during ER and Flexion stretching  R Shoulder and C-spine AROM             PT Education - 04/28/15 1516    Education provided Yes   Education Details gentle chin tuck and posture correction   Person(s)  Educated Patient   Methods Explanation;Demonstration   Comprehension Verbalized understanding;Returned demonstration          PT Short Term Goals - 03/25/15 1043    PT SHORT TERM GOAL #2   Title R Shoulder PROM Flexion to 150, ABD to 120, ER to 60 at 80 ABD  ER goal met, Flexion to 140, ABD not re-assessed today   Status Partially Met           PT Long Term Goals - 04/28/15 1517    PT LONG TERM GOAL #1   Title independent with advanced HEP as necessary by 05/26/15   Status On-going   PT LONG TERM GOAL #2   Title R shoulder AROM WFL and MMT 4/5 or better all planes by 05/26/15   Status On-going   PT LONG TERM GOAL #3   Title pt able to perform all ADLs and chores without limitation by R shoulder pain or weakness by 05/26/15   Status On-going   PT LONG TERM GOAL #4   Title pt able to return recreational activities without restriction by R shoulder pain or weakness by 05/26/15   Status On-going               Plan - 04/28/15 1145    Clinical Impression Statement Today is [name] [name]'s 25th PT treatment since her R shoulder surgery.  Her Shoulder AROM is still non-functional above shoulder height due to pain.  Her PROM is: Flexion 135, ER 80, IR 63, ABD 120.  She underwent MRI to c-spine yesterday and Right C6 nerve root impingement noted which may be contributing to her R UE pain/symptoms.  However, looked at C-spine AROM today and pretty symmetric R vs L and no c/o R UE symptoms with AROM.  Also performed R UE ULTT and no symptoms noted so difficult to say confidently that c-spine is causing her pain. However, this does seem likely as at least a portion of her pain due to pain extending to thumb at times.  Given these findings, [name] [name] is interested in addressing her neck with Physical Therapy to include mechanical traction and I agree.  To that end, I'd like to extend her POC for 4 additional weeks at 2x/wk for a total of 33 PT visits.   Pt will benefit from skilled  therapeutic intervention in order to improve on the following deficits Pain;Decreased strength;Decreased mobility;Decreased range of motion;Impaired UE functional use;Postural dysfunction   Rehab Potential Good   PT Frequency 2x / week  PT Duration 4 weeks   PT Treatment/Interventions Manual techniques;Vasopneumatic Device;Taping;Passive range of motion;Patient/family education;Therapeutic exercise;Therapeutic activities;Cryotherapy;Electrical Stimulation;Dry needling;Moist Heat;Traction   PT Next Visit Plan c-spine mechanical traction 8-10# starting wt; shoulder stretching and AROM to tolerance; scapular retraction and neck retraction exercises to tolerance   Consulted and Agree with Plan of Care Patient        Problem List Patient Active Problem List   Diagnosis Date Noted  . Chest pain 05/09/2013  . Hypertension 02/23/2012  . Hyperlipidemia 02/23/2012  . Right shoulder pain 01/25/2012  . Left wrist pain 01/16/2012  . Neck pain 12/26/2011    Neshia Mckenzie PT, OCS 04/28/2015, 3:19 PM  [facility] [address]  [place], [place], [address] Phone: [phone]   Fax:  [phone]  Name: [name] [name] MRN: [id number] Date of Birth: [birth year]

## 2015-04-30 ENCOUNTER — Ambulatory Visit: Payer: Medicare Other | Admitting: Physical Therapy

## 2015-05-05 ENCOUNTER — Ambulatory Visit: Payer: Medicare Other | Admitting: Rehabilitation

## 2015-05-05 ENCOUNTER — Encounter: Payer: Self-pay | Admitting: Rehabilitation

## 2015-05-05 DIAGNOSIS — M25611 Stiffness of right shoulder, not elsewhere classified: Secondary | ICD-10-CM

## 2015-05-05 DIAGNOSIS — M542 Cervicalgia: Secondary | ICD-10-CM

## 2015-05-05 DIAGNOSIS — R29898 Other symptoms and signs involving the musculoskeletal system: Secondary | ICD-10-CM

## 2015-05-05 DIAGNOSIS — M25511 Pain in right shoulder: Secondary | ICD-10-CM

## 2015-05-05 NOTE — Therapy (Signed)
[facility] [address]  [place], [place], [address] Phone: [phone]   Fax:  [phone]  Physical Therapy Treatment  Patient Details  Name: [name] [name] MRN: [id number] Date of Birth: 1945/12/21 Referring Provider: Kathryne Hitch  Encounter Date: 05/05/2015    Past Medical History  Diagnosis Date  . Allergy   . Hypertension   . Hyperlipidemia   . IBS (irritable bowel syndrome)   . Arthritis   . Full dentures   . Wears glasses     readers  . Asthma     no inhalers  . PONV (postoperative nausea and vomiting)     Past Surgical History  Procedure Laterality Date  . Shoulder surgery      left  . Elbow surgery    . Knee surgery      lt  . Carpal tunnel release      rt  . Varicose vein surgery    . Nasal septum surgery    . Abdominal hysterectomy    . Cholecystectomy    . Appendectomy    . Tonsillectomy and adenoidectomy    . Breast lumpectomy  1985    lt  . Eye surgery      both cataracts  . Wrist arthroscopy Right 09/20/2013    Procedure: RIGHT ARTHROSCOPY WRIST EXCISION PISIFORM;  Surgeon: Cammie Sickle, MD;  Location: [facility];  Service: Orthopedics;  Laterality: Right;  . Shoulder arthroscopy with bicepstenotomy Right 05/09/2014    Procedure: SHOULDER ARTHROSCOPY WITH BICEPSTENOTOMY;  Surgeon: Ninetta Lights, MD;  Location: [facility];  Service: Orthopedics;  Laterality: Right;  Debridement Rotator Cuff Tear  . Shoulder acromioplasty Right 05/09/2014    Procedure: SHOULDER ACROMIOPLASTY;  Surgeon: Ninetta Lights, MD;  Location: [facility];  Service: Orthopedics;  Laterality: Right;  . Shoulder arthroscopy with rotator cuff repair Right 12/19/2014    Procedure: RIGHT SHOULDER ARTHROSCOPY WITH ROTATOR CUFF REPAIR,CALCIFIC DEBRIDEMENT EXTENSIVE;  Surgeon: Ninetta Lights, MD;  Location: [facility];  Service: Orthopedics;  Laterality:  Right;    There were no vitals filed for this visit.  Visit Diagnosis:  Shoulder stiffness, right  Pain in joint, shoulder region, right  Shoulder weakness  Limitation of joint motion of right shoulder  Neck pain      Subjective Assessment - 05/05/15 0847    Subjective the neck and shoulder is sore, stiff and painful due to traveling to [place] this weekend.     Currently in Pain? Yes   Pain Score 8    Pain Location Shoulder   Pain Orientation Right   Aggravating Factors  reaching, traveling   Pain Relieving Factors medication, sling, rest       Today's Treatment: Seated cervical AROM Rot L x 10 each Seated cervical retraction 6"x10x2 with demo and vcs for completion Supine cervical retraction 6"x10  Seated scap retraction 2x15 Red  L UT and LS stretches 3x20"   R shoulder PROM to tolerance  Cervical traction performed manually in neutral with increased UE radicular symptoms at about a 15deg pull the symptoms increase but not much.  Mechanical traction performed 10# 60"/20" x 70mn remaining in room with pt nervous about traction and feeling claustrophobic.                              PT Short Term Goals - 03/25/15  [place] #2   Title R Shoulder PROM Flexion to 150, ABD to 120, ER to 60 at 80 ABD  ER goal met, Flexion to 140, ABD not re-assessed today   Status Partially Met           PT Long Term Goals - 04/28/15 1517    PT LONG TERM GOAL #1   Title independent with advanced HEP as necessary by 05/26/15   Status On-going   PT LONG TERM GOAL #2   Title R shoulder AROM WFL and MMT 4/5 or better all planes by 05/26/15   Status On-going   PT LONG TERM GOAL #3   Title pt able to perform all ADLs and chores without limitation by R shoulder pain or weakness by 05/26/15   Status On-going   PT LONG TERM GOAL #4   Title pt able to return recreational activities without restriction by R shoulder pain or weakness by 05/26/15    Status On-going               Problem List Patient Active Problem List   Diagnosis Date Noted  . Chest pain 05/09/2013  . Hypertension 02/23/2012  . Hyperlipidemia 02/23/2012  . Right shoulder pain 01/25/2012  . Left wrist pain 01/16/2012  . Neck pain 12/26/2011    Stark Bray, DPT, CMP 05/05/2015, 8:51 AM  [facility] [address]  [place], [place], [address] Phone: [phone]   Fax:  [phone]  Name: [name] [name] MRN: [id number] Date of Birth: 06-19-1945

## 2015-05-07 ENCOUNTER — Ambulatory Visit: Payer: Medicare Other | Admitting: Rehabilitation

## 2015-05-07 DIAGNOSIS — M25611 Stiffness of right shoulder, not elsewhere classified: Secondary | ICD-10-CM | POA: Diagnosis not present

## 2015-05-07 DIAGNOSIS — R29898 Other symptoms and signs involving the musculoskeletal system: Secondary | ICD-10-CM

## 2015-05-07 DIAGNOSIS — M25511 Pain in right shoulder: Secondary | ICD-10-CM

## 2015-05-07 DIAGNOSIS — M542 Cervicalgia: Secondary | ICD-10-CM

## 2015-05-07 NOTE — Therapy (Signed)
[facility] [address]  [place], [place], [address] Phone: [phone]   Fax:  [phone]  Physical Therapy Treatment  Patient Details  Name: [name] [name] MRN: [id number] Date of Birth: Aug 25, 1945 Referring Provider: Kathryne Hitch  Encounter Date: 05/07/2015    Past Medical History  Diagnosis Date  . Allergy   . Hypertension   . Hyperlipidemia   . IBS (irritable bowel syndrome)   . Arthritis   . Full dentures   . Wears glasses     readers  . Asthma     no inhalers  . PONV (postoperative nausea and vomiting)     Past Surgical History  Procedure Laterality Date  . Shoulder surgery      left  . Elbow surgery    . Knee surgery      lt  . Carpal tunnel release      rt  . Varicose vein surgery    . Nasal septum surgery    . Abdominal hysterectomy    . Cholecystectomy    . Appendectomy    . Tonsillectomy and adenoidectomy    . Breast lumpectomy  1985    lt  . Eye surgery      both cataracts  . Wrist arthroscopy Right 09/20/2013    Procedure: RIGHT ARTHROSCOPY WRIST EXCISION PISIFORM;  Surgeon: Cammie Sickle, MD;  Location: [place];  Service: Orthopedics;  Laterality: Right;  . Shoulder arthroscopy with bicepstenotomy Right 05/09/2014    Procedure: SHOULDER ARTHROSCOPY WITH BICEPSTENOTOMY;  Surgeon: Ninetta Lights, MD;  Location: [place] [facility];  Service: Orthopedics;  Laterality: Right;  Debridement Rotator Cuff Tear  . Shoulder acromioplasty Right 05/09/2014    Procedure: SHOULDER ACROMIOPLASTY;  Surgeon: Ninetta Lights, MD;  Location: [place] [facility];  Service: Orthopedics;  Laterality: Right;  . Shoulder arthroscopy with rotator cuff repair Right 12/19/2014    Procedure: RIGHT SHOULDER ARTHROSCOPY WITH ROTATOR CUFF REPAIR,CALCIFIC DEBRIDEMENT EXTENSIVE;  Surgeon: Ninetta Lights, MD;  Location: [facility];  Service: Orthopedics;  Laterality:  Right;    There were no vitals filed for this visit.  Visit Diagnosis:  No diagnosis found.      Subjective Assessment - 05/07/15 1014    Subjective no adverse effects for the traction.  better than the last time.  MD wants her to get a cervical epidural injection.  Has an appt tomorrow with the neurologist tomorrow.     Currently in Pain? Yes   Pain Score 6    Pain Location Shoulder   Pain Orientation Right      Today's Treatment: UBE 60"/60" warm-up Seated cervical retraction 6"x10x2 with demo and vcs for completion Supine cervical retraction 6"x10 Supine Red band Bil Horizontal abduction, and d2 flexion x 15 each with cervical retraction   Supine punches 2# x 15 Supine shoulder circles 2# 15 c/cc scap retraction 2x15 Green L UT, LS, and scalene  stretches 2x20"   Mechanical traction performed 10# 60"/20" x 60mn 15deg pull                           PT Short Term Goals - 03/25/15 1043    PT SHORT TERM GOAL #2   Title R Shoulder PROM Flexion to 150, ABD to 120, ER to 60 at 80 ABD  ER goal met, Flexion to 140, ABD not re-assessed today   Status Partially  Met           PT Long Term Goals - 04/28/15 1517    PT LONG TERM GOAL #1   Title independent with advanced HEP as necessary by 05/26/15   Status On-going   PT LONG TERM GOAL #2   Title R shoulder AROM WFL and MMT 4/5 or better all planes by 05/26/15   Status On-going   PT LONG TERM GOAL #3   Title pt able to perform all ADLs and chores without limitation by R shoulder pain or weakness by 05/26/15   Status On-going   PT LONG TERM GOAL #4   Title pt able to return recreational activities without restriction by R shoulder pain or weakness by 05/26/15   Status On-going               Problem List Patient Active Problem List   Diagnosis Date Noted  . Chest pain 05/09/2013  . Hypertension 02/23/2012  . Hyperlipidemia 02/23/2012  . Right shoulder pain 01/25/2012  . Left wrist pain  01/16/2012  . Neck pain 12/26/2011    Stark Bray, DPT, CMP 05/07/2015, 10:20 AM  [facility] [address]  [place], [place], [address] Phone: [phone]   Fax:  [phone]  Name: [name] [name] MRN: [id number] Date of Birth: 07/18/1945

## 2015-05-12 ENCOUNTER — Ambulatory Visit: Payer: Medicare Other | Admitting: Rehabilitation

## 2015-05-12 ENCOUNTER — Encounter: Payer: Self-pay | Admitting: Rehabilitation

## 2015-05-12 DIAGNOSIS — M25511 Pain in right shoulder: Secondary | ICD-10-CM

## 2015-05-12 DIAGNOSIS — M542 Cervicalgia: Secondary | ICD-10-CM

## 2015-05-12 DIAGNOSIS — M25611 Stiffness of right shoulder, not elsewhere classified: Secondary | ICD-10-CM | POA: Diagnosis not present

## 2015-05-12 DIAGNOSIS — R29898 Other symptoms and signs involving the musculoskeletal system: Secondary | ICD-10-CM

## 2015-05-12 NOTE — Therapy (Signed)
[facility] [address]  [place], [place], [address] Phone: [phone]   Fax:  [phone]  Physical Therapy Treatment  Patient Details  Name: [name] [name] MRN: [id number] Date of Birth: [birth year] Referring Provider: Kathryne Hitch  Encounter Date: 05/12/2015      PT End of Session - 05/12/15 0937    Visit Number 28   Number of Visits 33   Date for PT Re-Evaluation 05/26/15   PT Start Time 0932   PT Stop Time 1033   PT Time Calculation (min) 61 min   Activity Tolerance Patient tolerated treatment well;Patient limited by pain      Past Medical History  Diagnosis Date  . Allergy   . Hypertension   . Hyperlipidemia   . IBS (irritable bowel syndrome)   . Arthritis   . Full dentures   . Wears glasses     readers  . Asthma     no inhalers  . PONV (postoperative nausea and vomiting)     Past Surgical History  Procedure Laterality Date  . Shoulder surgery      left  . Elbow surgery    . Knee surgery      lt  . Carpal tunnel release      rt  . Varicose vein surgery    . Nasal septum surgery    . Abdominal hysterectomy    . Cholecystectomy    . Appendectomy    . Tonsillectomy and adenoidectomy    . Breast lumpectomy  1985    lt  . Eye surgery      both cataracts  . Wrist arthroscopy Right 09/20/2013    Procedure: RIGHT ARTHROSCOPY WRIST EXCISION PISIFORM;  Surgeon: Cammie Sickle, MD;  Location: [facility];  Service: Orthopedics;  Laterality: Right;  . Shoulder arthroscopy with bicepstenotomy Right 05/09/2014    Procedure: SHOULDER ARTHROSCOPY WITH BICEPSTENOTOMY;  Surgeon: Ninetta Lights, MD;  Location: [facility];  Service: Orthopedics;  Laterality: Right;  Debridement Rotator Cuff Tear  . Shoulder acromioplasty Right 05/09/2014    Procedure: SHOULDER ACROMIOPLASTY;  Surgeon: Ninetta Lights, MD;  Location: [facility];  Service: Orthopedics;   Laterality: Right;  . Shoulder arthroscopy with rotator cuff repair Right 12/19/2014    Procedure: RIGHT SHOULDER ARTHROSCOPY WITH ROTATOR CUFF REPAIR,CALCIFIC DEBRIDEMENT EXTENSIVE;  Surgeon: Ninetta Lights, MD;  Location: [facility];  Service: Orthopedics;  Laterality: Right;    There were no vitals filed for this visit.  Visit Diagnosis:  Shoulder stiffness, right  Pain in joint, shoulder region, right  Shoulder weakness  Limitation of joint motion of right shoulder  Neck pain      Subjective Assessment - 05/12/15 0931    Subjective Had appt with Dr. Saintclair Halsted; wants her to do 4 weeks of therapy that now includes the neck before considering the injections.  The injections are also with contrast which she is allergic to.  The neck seems to be feeling a bit better.  It is not long term lasting but better.  The shoulder seems to be the biggest issue.  Significant night pain.     Currently in Pain? Yes   Pain Score 4    Pain Location Shoulder   Pain Orientation Right   Aggravating Factors  reaching, traveling   Pain Relieving Factors medication, rest      Today's Treatment: UBE 39mn/2min warm-up Seated cervical retraction 6"x10x2 with  demo and vcs for completion Supine cervical retraction 6"x10 Supine Red band Bil Horizontal abduction, and d2 flexion L x 15, unable to perform resisted or AROM with the R due to pain today; performed AAROM each with cervical retraction(attempted seated but too difficult) Supine shoulder circles 2# 15 c/cc Low row 15# 2x10   L sidelying ER 1# x 15 L sidelying AAROM abduction x 15  Manual PROM R shoulder to tolerance and L UT, LS, and scalene stretches 2x20"   Mechanical traction performed 14# 60"/20" x 70mn 15deg pull with ice on R shoulder                              PT Short Term Goals - 03/25/15 1043    PT SHORT TERM GOAL #2   Title R Shoulder PROM Flexion to 150, ABD to 120, ER to 60 at 80 ABD   ER goal met, Flexion to 140, ABD not re-assessed today   Status Partially Met           PT Long Term Goals - 04/28/15 1517    PT LONG TERM GOAL #1   Title independent with advanced HEP as necessary by 05/26/15   Status On-going   PT LONG TERM GOAL #2   Title R shoulder AROM WFL and MMT 4/5 or better all planes by 05/26/15   Status On-going   PT LONG TERM GOAL #3   Title pt able to perform all ADLs and chores without limitation by R shoulder pain or weakness by 05/26/15   Status On-going   PT LONG TERM GOAL #4   Title pt able to return recreational activities without restriction by R shoulder pain or weakness by 05/26/15   Status On-going               Plan - 05/12/15 0946    Clinical Impression Statement Most pain shoulder in origin today with most shooting pain occuring with lowering of shoulder elevation and abduction.  Does feel like the neck is improving with more cervical related treatment lately. Had to perform D2 flexion AAROM today due to more pain.     PT Next Visit Plan cervical traction, shoulder stretching and AROM to tolerance; scapular retraction and neck retraction exercises to tolerance        Problem List Patient Active Problem List   Diagnosis Date Noted  . Chest pain 05/09/2013  . Hypertension 02/23/2012  . Hyperlipidemia 02/23/2012  . Right shoulder pain 01/25/2012  . Left wrist pain 01/16/2012  . Neck pain 12/26/2011    [name] Bray1/30/2017, 10:18 AM  [facility] [place] [address]Phone: [phone]  Fax:  [phone] Name: [name] YASINMRN: [id number] of Birth: 3Oct 10, 1947

## 2015-05-14 ENCOUNTER — Ambulatory Visit: Payer: Medicare Other | Attending: Orthopedic Surgery | Admitting: Physical Therapy

## 2015-05-14 DIAGNOSIS — M542 Cervicalgia: Secondary | ICD-10-CM | POA: Insufficient documentation

## 2015-05-14 DIAGNOSIS — R29898 Other symptoms and signs involving the musculoskeletal system: Secondary | ICD-10-CM | POA: Diagnosis present

## 2015-05-14 DIAGNOSIS — M25511 Pain in right shoulder: Secondary | ICD-10-CM | POA: Diagnosis present

## 2015-05-14 DIAGNOSIS — M25611 Stiffness of right shoulder, not elsewhere classified: Secondary | ICD-10-CM | POA: Diagnosis not present

## 2015-05-14 NOTE — Therapy (Signed)
[facility] [address]  [address] [place], [place], [address] Phone: [phone]   Fax:  [phone]  Physical Therapy Treatment  Patient Details  Name: [name] [name] MRN: [id number] Date of Birth: [birth year] Referring Provider: Mckinley Jewel  Encounter Date: 05/14/2015      PT End of Session - 05/14/15 1108    Visit Number 29   Number of Visits 33   Date for PT Re-Evaluation 05/26/15   PT Start Time 1107   PT Stop Time 1200   PT Time Calculation (min) 53 min      Past Medical History  Diagnosis Date  . Allergy   . Hypertension   . Hyperlipidemia   . IBS (irritable bowel syndrome)   . Arthritis   . Full dentures   . Wears glasses     readers  . Asthma     no inhalers  . PONV (postoperative nausea and vomiting)     Past Surgical History  Procedure Laterality Date  . Shoulder surgery      left  . Elbow surgery    . Knee surgery      lt  . Carpal tunnel release      rt  . Varicose vein surgery    . Nasal septum surgery    . Abdominal hysterectomy    . Cholecystectomy    . Appendectomy    . Tonsillectomy and adenoidectomy    . Breast lumpectomy  1985    lt  . Eye surgery      both cataracts  . Wrist arthroscopy Right 09/20/2013    Procedure: RIGHT ARTHROSCOPY WRIST EXCISION PISIFORM;  Surgeon: Wyn Forster, MD;  Location: [facility];  Service: Orthopedics;  Laterality: Right;  . Shoulder arthroscopy with bicepstenotomy Right 05/09/2014    Procedure: SHOULDER ARTHROSCOPY WITH BICEPSTENOTOMY;  Surgeon: Loreta Ave, MD;  Location: [facility];  Service: Orthopedics;  Laterality: Right;  Debridement Rotator Cuff Tear  . Shoulder acromioplasty Right 05/09/2014    Procedure: SHOULDER ACROMIOPLASTY;  Surgeon: Loreta Ave, MD;  Location: [facility];  Service: Orthopedics;  Laterality: Right;  . Shoulder arthroscopy with rotator cuff repair Right 12/19/2014    Procedure: RIGHT SHOULDER ARTHROSCOPY WITH ROTATOR CUFF REPAIR,CALCIFIC DEBRIDEMENT EXTENSIVE;  Surgeon: Loreta Ave, MD;  [facility];  Service: Orthopedics;  Laterality: Right;    There were no vitals filed for this visit.  Visit Diagnosis:  Shoulder stiffness, right  Pain in joint, shoulder region, right  Shoulder weakness  Limitation of joint motion of right shoulder  Neck pain      Subjective Assessment - 05/14/15 1108    Subjective States feels "about the same". States is not planning to undergo epidural.  Plan is to f/u with ortho or neuro in 4 week period.  States neck feels more relaxed for approx 2 hours following traction treatments.   Currently in Pain? Yes   Pain Score 4   4/10 with regular use, up to 8/10 with overuse or lifting above shoulder ht   Pain Location Shoulder   Pain Orientation Right         TODAY'S TREATMENT TherEx - Hooklying Wand Shoulder Flexion AAROM 15x Hooklying C-spine retraction 10x5" Hooklying B Horiz ABD Red TB 15x (not full ROM due to pain) Hooklying "W" with Red TB 15x Hooklying R Shoulder CW/CCW 4# 15x each L Side-Lying R ER 1# 15x  Manual -  TRP R UT and gentle neck stretch into L SB; R UE Nerve glides  TherEx - Low Row 15# 15x Standing Scap retraction with B Shoulder Ext to neutral Yellow TB 10x5"  Mechanical Traction - c-spine, 15dg pull, 14#/7#, 60"/20", 15'          PT Long Term Goals - 04/28/15 1517    PT LONG TERM GOAL #1   Title independent with advanced HEP as necessary by 05/26/15   Status On-going   PT LONG TERM GOAL #2   Title R shoulder AROM WFL and MMT 4/5 or better all planes by 05/26/15   Status On-going   PT LONG TERM GOAL #3   Title pt able to perform all ADLs and chores without limitation by R shoulder pain or weakness by 05/26/15   Status On-going   PT LONG TERM GOAL #4   Title pt able to return recreational activities without restriction by R shoulder pain or weakness by  05/26/15   Status On-going               Plan - 05/14/15 1150    Clinical Impression Statement Tolerating treatments well with regard to neck pain; however, continued shoulder pain and intermittent n/t in R hand digits #1-2 noted (C6 dermatone).  Her c/o catch in shoulder doesn't seem neck in origin so continuing with shoulder ROM and stability as able but not very aggressive for now.  Will see if more attention to c-spine will help with pain and ultimatly allow improved UE function.  Discussed another bout of dry needling to see if can help with catch in shoulder and she states she'd be willing to try again.   PT Next Visit Plan cervical traction, shoulder stretching and AROM to tolerance; scapular retraction and neck retraction exercises to tolerance   Consulted and Agree with Plan of Care Patient        Problem List Patient Active Problem List   Diagnosis Date Noted  . Chest pain 05/09/2013  . Hypertension 02/23/2012  . Hyperlipidemia 02/23/2012  . Right shoulder pain 01/25/2012  . Left wrist pain 01/16/2012  . Neck pain 12/26/2011    Joyel Chenette PT, OCS 05/15/2015, 11:23 AM  [facility] [address]  [address] [place], [place], [address] Phone: [phone]   Fax:  [phone]  Name: [name] [name] MRN: [id number] Date of Birth: 06-30-1945

## 2015-05-19 ENCOUNTER — Ambulatory Visit: Payer: Medicare Other | Admitting: Physical Therapy

## 2015-05-21 ENCOUNTER — Ambulatory Visit: Payer: Medicare Other | Admitting: Physical Therapy

## 2015-05-26 ENCOUNTER — Ambulatory Visit: Payer: Medicare Other | Admitting: Physical Therapy

## 2015-05-26 DIAGNOSIS — M25611 Stiffness of right shoulder, not elsewhere classified: Secondary | ICD-10-CM | POA: Diagnosis not present

## 2015-05-26 DIAGNOSIS — M542 Cervicalgia: Secondary | ICD-10-CM

## 2015-05-26 DIAGNOSIS — M25511 Pain in right shoulder: Secondary | ICD-10-CM

## 2015-05-26 DIAGNOSIS — R29898 Other symptoms and signs involving the musculoskeletal system: Secondary | ICD-10-CM

## 2015-05-26 NOTE — Therapy (Addendum)
[facility] [address]  [place], [place], [address] Phone: [phone]   Fax:  [phone]  Physical Therapy Treatment  Patient Details  Name: [name] [name] MRN: [id number] Date of Birth: [birth year] Referring Provider: Kathryne Hitch  Encounter Date: 05/26/2015      PT End of Session - 05/26/15 1157    Visit Number 30   Number of Visits 33   Date for PT Re-Evaluation 05/26/15   PT Start Time 1108   PT Stop Time 1155   PT Time Calculation (min) 47 min      Past Medical History  Diagnosis Date  . Allergy   . Hypertension   . Hyperlipidemia   . IBS (irritable bowel syndrome)   . Arthritis   . Full dentures   . Wears glasses     readers  . Asthma     no inhalers  . PONV (postoperative nausea and vomiting)     Past Surgical History  Procedure Laterality Date  . Shoulder surgery      left  . Elbow surgery    . Knee surgery      lt  . Carpal tunnel release      rt  . Varicose vein surgery    . Nasal septum surgery    . Abdominal hysterectomy    . Cholecystectomy    . Appendectomy    . Tonsillectomy and adenoidectomy    . Breast lumpectomy  1985    lt  . Eye surgery      both cataracts  . Wrist arthroscopy Right 09/20/2013    Procedure: RIGHT ARTHROSCOPY WRIST EXCISION PISIFORM;  Surgeon: Cammie Sickle, MD;  Location: [facility];  Service: Orthopedics;  Laterality: Right;  . Shoulder arthroscopy with bicepstenotomy Right 05/09/2014    Procedure: SHOULDER ARTHROSCOPY WITH BICEPSTENOTOMY;  Surgeon: Ninetta Lights, MD;  Location: [facility];  Service: Orthopedics;  Laterality: Right;  Debridement Rotator Cuff Tear  . Shoulder acromioplasty Right 05/09/2014    Procedure: SHOULDER ACROMIOPLASTY;  Surgeon: Ninetta Lights, MD;  Location: [facility];  Service: Orthopedics;  Laterality: Right;  . Shoulder arthroscopy with rotator cuff repair Right 12/19/2014     Procedure: RIGHT SHOULDER ARTHROSCOPY WITH ROTATOR CUFF REPAIR,CALCIFIC DEBRIDEMENT EXTENSIVE;  Surgeon: Ninetta Lights, MD;  Location: [facility];  Service: Orthopedics;  Laterality: Right;    There were no vitals filed for this visit.  Visit Diagnosis:  Shoulder stiffness, right  Pain in joint, shoulder region, right  Shoulder weakness  Limitation of joint motion of right shoulder  Neck pain      Subjective Assessment - 05/26/15 1111    Subjective Woke up this AM 7/10 in R shoulder and states pain would run from shoulder to neck.  States pain has since decreased to dull ache 4/10.  Chief complaint is inability to sleep due to pain in R Shoulder stating wakes every 2-3 hours and often has to get out of bed and perform pendulums.   Currently in Pain? Yes   Pain Score 4    Pain Location Shoulder   Pain Orientation Right        TODAY'S TREATMENT TherEx - UBE lvl 1.5 90"/90"Hooklying Wand Shoulder Flexion AAROM 15x  Manual - TPR and STM R posterior scap mms and scap distraction mobs in L Side-Lying; TPR followed by STM and stretch to R pec; R UE Nerve glides; R GH  grade 2 AP and caudal glides  TherEx - L Side-Lying R ER AROM 10x L Side-Lying R ER 1# 15x L Side-Lying R CW/CCW in 90 ABD 2# 15x each Prone Scap retraction with B shoulder extension to neutral 12x3"  Pt declined traction today stating has a mild headache            PT Short Term Goals - 03/25/15 1043    PT SHORT TERM GOAL #2   Title R Shoulder PROM Flexion to 150, ABD to 120, ER to 60 at 80 ABD  ER goal met, Flexion to 140, ABD not re-assessed today   Status Partially Met           PT Long Term Goals - 04/28/15 1517    PT LONG TERM GOAL #1   Title independent with advanced HEP as necessary by 2015-06-05   Status On-going   PT LONG TERM GOAL #2   Title R shoulder AROM WFL and MMT 4/5 or better all planes by 05-Jun-2015   Status On-going   PT LONG TERM GOAL #3   Title pt able to  perform all ADLs and chores without limitation by R shoulder pain or weakness by June 05, 2015   Status On-going   PT LONG TERM GOAL #4   Title pt able to return recreational activities without restriction by R shoulder pain or weakness by 2015-06-05   Status On-going               Plan - 2015/06/05 1159    Clinical Impression Statement more focus on manual soft tissue work today to see if scapular mms contribute to shoulder pain. Very tender in infraspinatus as well as to anterior delt area.  Pectorals and rhoboids tight without tenderness but limited ability to perform scapular distraction.   PT Next Visit Plan cervical traction, shoulder stretching and AROM to tolerance; scapular retraction and neck retraction exercises to tolerance   Consulted and Agree with Plan of Care Patient          G-Codes - 2015-06-05 1314    Functional Assessment Tool Used FOTO 57% limitation   Functional Limitation Carrying, moving and handling objects   Carrying, Moving and Handling Objects Current Status (O5929) At least 40 percent but less than 60 percent impaired, limited or restricted   Carrying, Moving and Handling Objects Goal Status (W4462) At least 40 percent but less than 60 percent impaired, limited or restricted      Problem List Patient Active Problem List   Diagnosis Date Noted  . Chest pain 05/09/2013  . Hypertension 02/23/2012  . Hyperlipidemia 02/23/2012  . Right shoulder pain 01/25/2012  . Left wrist pain 01/16/2012  . Neck pain 12/26/2011    Becka Lagasse PT, OCS 06/05/15, 1:15 PM  [facility] [address]  [place], [place], [address] Phone: [phone]   Fax:  [phone]  Name: [name] [name] MRN: [id number] Date of Birth: 1945-07-31

## 2015-05-28 ENCOUNTER — Ambulatory Visit: Payer: Medicare Other | Admitting: Physical Therapy

## 2015-05-28 DIAGNOSIS — R29898 Other symptoms and signs involving the musculoskeletal system: Secondary | ICD-10-CM

## 2015-05-28 DIAGNOSIS — M25611 Stiffness of right shoulder, not elsewhere classified: Secondary | ICD-10-CM

## 2015-05-28 DIAGNOSIS — M25511 Pain in right shoulder: Secondary | ICD-10-CM

## 2015-05-28 DIAGNOSIS — M542 Cervicalgia: Secondary | ICD-10-CM

## 2015-05-28 NOTE — Therapy (Signed)
[facility] [address]  [place], [place], [address] Phone: [phone]   Fax:  [phone]  Physical Therapy Treatment  Patient Details  Name: [name] [name] MRN: [id number] Date of Birth: 05/15/1945 Referring Provider: Kathryne Hitch  Encounter Date: 05/28/2015      PT End of Session - 05/28/15 1106    Visit Number 31   Number of Visits 33   Date for PT Re-Evaluation 06/11/15   PT Start Time 1106   PT Stop Time 1155   PT Time Calculation (min) 49 min      Past Medical History  Diagnosis Date  . Allergy   . Hypertension   . Hyperlipidemia   . IBS (irritable bowel syndrome)   . Arthritis   . Full dentures   . Wears glasses     readers  . Asthma     no inhalers  . PONV (postoperative nausea and vomiting)     Past Surgical History  Procedure Laterality Date  . Shoulder surgery      left  . Elbow surgery    . Knee surgery      lt  . Carpal tunnel release      rt  . Varicose vein surgery    . Nasal septum surgery    . Abdominal hysterectomy    . Cholecystectomy    . Appendectomy    . Tonsillectomy and adenoidectomy    . Breast lumpectomy  1985    lt  . Eye surgery      both cataracts  . Wrist arthroscopy Right 09/20/2013    Procedure: RIGHT ARTHROSCOPY WRIST EXCISION PISIFORM;  Surgeon: Cammie Sickle, MD;  Location: [facility];  Service: Orthopedics;  Laterality: Right;  . Shoulder arthroscopy with bicepstenotomy Right 05/09/2014    Procedure: SHOULDER ARTHROSCOPY WITH BICEPSTENOTOMY;  Surgeon: Ninetta Lights, MD;  Location: [facility];  Service: Orthopedics;  Laterality: Right;  Debridement Rotator Cuff Tear  . Shoulder acromioplasty Right 05/09/2014    Procedure: SHOULDER ACROMIOPLASTY;  Surgeon: Ninetta Lights, MD;  Location: [facility];  Service: Orthopedics;  Laterality: Right;  . Shoulder arthroscopy with rotator cuff repair Right 12/19/2014     Procedure: RIGHT SHOULDER ARTHROSCOPY WITH ROTATOR CUFF REPAIR,CALCIFIC DEBRIDEMENT EXTENSIVE;  Surgeon: Ninetta Lights, MD;  Location: [facility];  Service: Orthopedics;  Laterality: Right;    There were no vitals filed for this visit.  Visit Diagnosis:  Pain in joint, shoulder region, right - Plan: PT plan of care cert/re-cert  Shoulder stiffness, right - Plan: PT plan of care cert/re-cert  Shoulder weakness - Plan: PT plan of care cert/re-cert  Limitation of joint motion of right shoulder - Plan: PT plan of care cert/re-cert  Neck pain - Plan: PT plan of care cert/re-cert      Subjective Assessment - 05/28/15 1107    Subjective "Same".  Rates R Shoulder pain 5/10 today.   Currently in Pain? Yes   Pain Score 5    Pain Location Shoulder   Pain Orientation Right            [facility] PT Assessment - 05/28/15 0001    PROM   Right Shoulder Flexion 130 Degrees   Right Shoulder ABduction 110 Degrees   Right Shoulder Internal Rotation 73 Degrees   Right Shoulder External Rotation 72 Degrees       TODAY'S TREATMENT:  Combo: [place] 20%, 5cm, 0.5W/cm2,  1.0 MHz with Premod E-stim (10-'20Hz'$ ) x12' to R lateral shoulder and subacromial space  Manual - Grade 2 and some 3 AP mobs R GH with stretching into ER, IR, and Flexion.  PROM assessment.  Reviewed HEP making certain she is still working to improve scapular retraction and shoulder stability.  Pt declined traction stating doesn't feel as though is doing anything             PT Short Term Goals - 05/28/15 1204    PT SHORT TERM GOAL #2   Title R Shoulder PROM Flexion to 150, ABD to 120, ER to 60 at 80 ABD   Status Not Met           PT Long Term Goals - 05/28/15 1202    PT LONG TERM GOAL #1   Title independent with advanced HEP as necessary by 06/10/15   Status On-going   PT LONG TERM GOAL #2   Title R shoulder AROM WFL and MMT 4/5 or better all planes by 06/10/15   Status On-going   PT LONG TERM  GOAL #3   Title pt able to perform all ADLs and chores without limitation by R shoulder pain or weakness by 06/10/15   Status On-going   PT LONG TERM GOAL #4   Title pt able to return recreational activities without restriction by R shoulder pain or weakness by 06/10/15   Status On-going               Plan - 05/28/15 1205    Clinical Impression Statement [name] [name]'s progress has plateaued lately.  Her Shoulder AROM, PROM, pain, and level of function have not improved in the past few weeks.  We addressed neck with manual and mechanical traction but she denies benefit and requested to stop traction.  Her chief complaint is pain to R lateral shoulder in subacromial region.  Pain and catching sensation noted in this area with nearly all AROM activities but most notable with horizontal adduction.  In addtion, POS hawkins to R shoulder, pain with ABD and ER RROM.  Symptoms seem to indicate subacromial impingment (hopefully scar tissue) but cannot r/o RC tear.  Due to lack of progress, I have advised her to reach out to MD for follow-up.  She has 2 visits remaining on her current POC but I've advised her that I don't plan on pushing into ROM aggressively until she is seen by MD so treatments will be limited to pain control and attempts at improving scapular mechanics (more retraction).  She missed some appointments in this POC period and her date of POC ended last week.  I would like to therefore extend the date of her POC for 2 weeks to complete her final 2 treatments.  PT beyond this is unlikely unless there is a change in her status/progress.   Pt will benefit from skilled therapeutic intervention in order to improve on the following deficits Pain;Decreased strength;Decreased mobility;Decreased range of motion;Impaired UE functional use;Postural dysfunction   Rehab Potential Fair   PT Frequency 1x / week   PT Duration 2 weeks   PT Treatment/Interventions Manual techniques;Vasopneumatic  Device;Taping;Passive range of motion;Patient/family education;Therapeutic exercise;Therapeutic activities;Cryotherapy;Electrical Stimulation;Dry needling;Moist Heat   PT Next Visit Plan shoulder stretching and AROM to tolerance; scapular retraction and neck retraction exercises to tolerance   Consulted and Agree with Plan of Care Patient        Problem List Patient Active Problem List   Diagnosis Date Noted  . Chest pain  05/09/2013  . Hypertension 02/23/2012  . Hyperlipidemia 02/23/2012  . Right shoulder pain 01/25/2012  . Left wrist pain 01/16/2012  . Neck pain 12/26/2011    Jahmai Finelli PT, OCS 05/28/2015, 12:17 PM  [facility] [address]  [place], [place], [address] Phone: [phone]   Fax:  [phone]  Name: [name] [name] MRN: [id number] Date of Birth: 02/23/1946

## 2015-06-02 ENCOUNTER — Ambulatory Visit: Payer: Medicare Other | Admitting: Physical Therapy

## 2015-06-02 DIAGNOSIS — M25511 Pain in right shoulder: Secondary | ICD-10-CM

## 2015-06-02 DIAGNOSIS — R29898 Other symptoms and signs involving the musculoskeletal system: Secondary | ICD-10-CM

## 2015-06-02 DIAGNOSIS — M25611 Stiffness of right shoulder, not elsewhere classified: Secondary | ICD-10-CM | POA: Diagnosis not present

## 2015-06-02 NOTE — Therapy (Signed)
[facility] [address]  [place], [place], [address] Phone: [phone]   Fax:  [phone]  Physical Therapy Treatment  Patient Details  Name: [name] [name] MRN: [id number] Date of Birth: 04-30-1945 Referring Provider: Kathryne Hitch  Encounter Date: 06/02/2015      PT End of Session - 06/02/15 1057    Visit Number 32   Number of Visits 33   Date for PT Re-Evaluation 06/11/15   PT Start Time 1055   PT Stop Time 1145   PT Time Calculation (min) 50 min      Past Medical History  Diagnosis Date  . Allergy   . Hypertension   . Hyperlipidemia   . IBS (irritable bowel syndrome)   . Arthritis   . Full dentures   . Wears glasses     readers  . Asthma     no inhalers  . PONV (postoperative nausea and vomiting)     Past Surgical History  Procedure Laterality Date  . Shoulder surgery      left  . Elbow surgery    . Knee surgery      lt  . Carpal tunnel release      rt  . Varicose vein surgery    . Nasal septum surgery    . Abdominal hysterectomy    . Cholecystectomy    . Appendectomy    . Tonsillectomy and adenoidectomy    . Breast lumpectomy  1985    lt  . Eye surgery      both cataracts  . Wrist arthroscopy Right 09/20/2013    Procedure: RIGHT ARTHROSCOPY WRIST EXCISION PISIFORM;  Surgeon: Cammie Sickle, MD;  Location: [facility];  Service: Orthopedics;  Laterality: Right;  . Shoulder arthroscopy with bicepstenotomy Right 05/09/2014    Procedure: SHOULDER ARTHROSCOPY WITH BICEPSTENOTOMY;  Surgeon: Ninetta Lights, MD;  Location: [facility];  Service: Orthopedics;  Laterality: Right;  Debridement Rotator Cuff Tear  . Shoulder acromioplasty Right 05/09/2014    Procedure: SHOULDER ACROMIOPLASTY;  Surgeon: Ninetta Lights, MD;  Location: [facility];  Service: Orthopedics;  Laterality: Right;  . Shoulder arthroscopy with rotator cuff repair Right 12/19/2014     Procedure: RIGHT SHOULDER ARTHROSCOPY WITH ROTATOR CUFF REPAIR,CALCIFIC DEBRIDEMENT EXTENSIVE;  Surgeon: Ninetta Lights, MD;  Location: [facility];  Service: Orthopedics;  Laterality: Right;    There were no vitals filed for this visit.  Visit Diagnosis:  Pain in joint, shoulder region, right  Shoulder stiffness, right  Shoulder weakness  Limitation of joint motion of right shoulder      Subjective Assessment - 06/02/15 1055    Subjective States experienced several hours of pain reduction following last treatment but then pain returned rating 4-4.5/10 over the weekend.  Today pain is 5/10.  Still difficulty sleeping every night.   Currently in Pain? Yes   Pain Score 5    Pain Location Shoulder   Pain Orientation Right         TODAY'S TREATMENT: Manual - Supine TPR and stretch R Pec, Grade 2 and some 3 AP mobs R GH with stretching into ER, IR, and Flexion.L Side-Lying R scap mobs to tolerance attempting for retraction and distraction but very guarded (notes lateral shoulder pain with elevation), Grade 2 AP and Caudal glides T ACJ (very tender there today).  TherEx - L Side-Lying R ER AROM 10x L Side-Lying R CW/CCW in 90  ABD 10x each Supine R CW/CCW in 90 Flexion 10x each Supine B Scap retraction and shoulder ER isometric 10x3"  Combo: [place] 20%, 5cm, 0.5W/cm2, 1.0 MHz with Premod E-stim (10-'20Hz'$ ) x12' to R lateral shoulder and subacromial space                          PT Short Term Goals - 05/28/15 1204    PT SHORT TERM GOAL #2   Title R Shoulder PROM Flexion to 150, ABD to 120, ER to 60 at 80 ABD   Status Not Met           PT Long Term Goals - 05/28/15 1202    PT LONG TERM GOAL #1   Title independent with advanced HEP as necessary by 06/10/15   Status On-going   PT LONG TERM GOAL #2   Title R shoulder AROM WFL and MMT 4/5 or better all planes by 06/10/15   Status On-going   PT LONG TERM GOAL #3   Title pt able to perform  all ADLs and chores without limitation by R shoulder pain or weakness by 06/10/15   Status On-going   PT LONG TERM GOAL #4   Title pt able to return recreational activities without restriction by R shoulder pain or weakness by 06/10/15   Status On-going               Plan - 06/02/15 1149    Clinical Impression Statement pt states she experienced hours of pain reduction following last treatment and due to this we repeated US/E-stim combo again today along with manual and some exercises.  Lateral shoulder pain noted with scapular elevation AROM and PROM during manual today and significant TTP noted in R ACJ.  The ACJ was addressed in prior shoulder surgery so not certain why pain here.  She has one visit remaining on current POC.  After this we will see what MD recommends.   PT Next Visit Plan manual and modalities for pain; update HEP?   Consulted and Agree with Plan of Care Patient        Problem List Patient Active Problem List   Diagnosis Date Noted  . Chest pain 05/09/2013  . Hypertension 02/23/2012  . Hyperlipidemia 02/23/2012  . Right shoulder pain 01/25/2012  . Left wrist pain 01/16/2012  . Neck pain 12/26/2011    [name] [name] PT, OCS 06/02/2015, 11:55 AM  [facility] [address]  [place], [place], [address] Phone: [phone]   Fax:  [phone]  Name: [name] [name] MRN: [id number] Date of Birth: [birth year]

## 2015-06-04 ENCOUNTER — Ambulatory Visit: Payer: Medicare Other | Admitting: Physical Therapy

## 2015-06-04 DIAGNOSIS — M25611 Stiffness of right shoulder, not elsewhere classified: Secondary | ICD-10-CM

## 2015-06-04 DIAGNOSIS — M25511 Pain in right shoulder: Secondary | ICD-10-CM

## 2015-06-04 DIAGNOSIS — R29898 Other symptoms and signs involving the musculoskeletal system: Secondary | ICD-10-CM

## 2015-06-04 NOTE — Therapy (Addendum)
[name] [address]  [place], [place], [address] Phone: [phone]   Fax:  [phone]  Physical Therapy Treatment  Patient Details  Name: [name] [name] MRN: [id number] Date of Birth: [birth year] Referring Provider: Kathryne Hitch  Encounter Date: 06/04/2015      PT End of Session - 06/04/15 1111    Visit Number 33   Number of Visits 77   PT Start Time 1109   PT Stop Time 1154   PT Time Calculation (min) 45 min      Past Medical History  Diagnosis Date  . Allergy   . Hypertension   . Hyperlipidemia   . IBS (irritable bowel syndrome)   . Arthritis   . Full dentures   . Wears glasses     readers  . Asthma     no inhalers  . PONV (postoperative nausea and vomiting)     Past Surgical History  Procedure Laterality Date  . Shoulder surgery      left  . Elbow surgery    . Knee surgery      lt  . Carpal tunnel release      rt  . Varicose vein surgery    . Nasal septum surgery    . Abdominal hysterectomy    . Cholecystectomy    . Appendectomy    . Tonsillectomy and adenoidectomy    . Breast lumpectomy  1985    lt  . Eye surgery      both cataracts  . Wrist arthroscopy Right 09/20/2013    Procedure: RIGHT ARTHROSCOPY WRIST EXCISION PISIFORM;  Surgeon: Cammie Sickle, MD;  Location: [facility];  Service: Orthopedics;  Laterality: Right;  . Shoulder arthroscopy with bicepstenotomy Right 05/09/2014    Procedure: SHOULDER ARTHROSCOPY WITH BICEPSTENOTOMY;  Surgeon: Ninetta Lights, MD;  Location: [facility];  Service: Orthopedics;  Laterality: Right;  Debridement Rotator Cuff Tear  . Shoulder acromioplasty Right 05/09/2014    Procedure: SHOULDER ACROMIOPLASTY;  Surgeon: Ninetta Lights, MD;  Location: [facility];  Service: Orthopedics;  Laterality: Right;  . Shoulder arthroscopy with rotator cuff repair Right 12/19/2014    Procedure: RIGHT SHOULDER  ARTHROSCOPY WITH ROTATOR CUFF REPAIR,CALCIFIC DEBRIDEMENT EXTENSIVE;  Surgeon: Ninetta Lights, MD;  Location: [facility];  Service: Orthopedics;  Laterality: Right;    There were no vitals filed for this visit.  Visit Diagnosis:  Pain in joint, shoulder region, right  Shoulder stiffness, right  Shoulder weakness      Subjective Assessment - 06/04/15 1112    Subjective Pt heard from MD and has an appointment scheduled with him in 2 days.  States had great deal of difficulty sleeping last night due to shoulder pain.  Overall, she states her pain seems to run 4-5/10 lately.  She states she feels as though her pain is coming from her shoulder not her neck.   Currently in Pain? Yes   Pain Score 5    Pain Location Shoulder   Pain Orientation Right           TODAY'S TREATMENT: Manual - Supine TPR and stretch R Pec, Grade 2 and some 3 AP mobs R GH with stretching into ER, IR, and Flexion.L Side-Lying R scap mobs to tolerance attempting for retraction and distraction but still limited tolerance, STM R anterior shoulder between coracoid process and ACJ; Grade 2 AP and Caudal glides R ACJ.  Combo:  [place] 20%, 5cm, 0.5W/cm2, 3.3 MHz with Premod E-stim (10-20Hz) x 6' to R anterior shoulder and 8' to R posterolateral shoulder and subacromial space          PT Short Term Goals - 05/28/15 1204    PT SHORT TERM GOAL #2   Title R Shoulder PROM Flexion to 150, ABD to 120, ER to 60 at 80 ABD   Status Not Met           PT Long Term Goals - 05/28/15 1202    PT LONG TERM GOAL #1   Title independent with advanced HEP as necessary by 06/10/15   Status On-going   PT LONG TERM GOAL #2   Title R shoulder AROM WFL and MMT 4/5 or better all planes by 06/10/15   Status On-going   PT LONG TERM GOAL #3   Title pt able to perform all ADLs and chores without limitation by R shoulder pain or weakness by 06/10/15   Status On-going   PT LONG TERM GOAL #4   Title pt able to return  recreational activities without restriction by R shoulder pain or weakness by 06/10/15   Status On-going               Plan - 06/04/15 1157    Clinical Impression Statement pt to MD in 2 days and she is going to call us after this to let us know his thoughts and plan going forward.  She continues to note some short term relief with US/E-stim combo so this performed again today with addition of anterior shoulder.   PT Next Visit Plan per MD recommendations may extend POC?   Consulted and Agree with Plan of Care Patient        G-Codes -   Functional Assessment Tool Used FOTO 57% limitation   Functional Limitation Carrying, moving and handling objects   Carrying, Moving and Handling Objects Current / Discharge Status [phone]) At least 40 percent but less than 60 percent impaired, limited or restricted   Carrying, Moving and Handling Objects Goal Status ([id number]) At least 40 percent but less than 60 percent impaired, limited or restricted           Problem List Patient Active Problem List   Diagnosis Date Noted  . Chest pain 05/09/2013  . Hypertension 02/23/2012  . Hyperlipidemia 02/23/2012  . Right shoulder pain 01/25/2012  . Left wrist pain 01/16/2012  . Neck pain 12/26/2011    Almira Phetteplace PT, OCS 06/04/2015, 11:59 AM  [facility] [address]  [place], [place], [address] Phone: [phone]   Fax:  [phone]  Name: [name] [name] MRN: [id number] Date of Birth: November 26, 1945   PHYSICAL THERAPY DISCHARGE SUMMARY  Visits from Start of Care: 81  Current functional level related to goals / functional outcomes: Goals not met, pt found to have re-injury and requires surgery.    Plan: Patient agrees to discharge.  Patient goals were not met. Patient is being discharged due to a change in medical status.  ?????        Leonette Most PT, OCS 07/29/2015 5:00 PM

## 2015-06-06 ENCOUNTER — Other Ambulatory Visit: Payer: Self-pay | Admitting: Orthopedic Surgery

## 2015-06-06 DIAGNOSIS — M75101 Unspecified rotator cuff tear or rupture of right shoulder, not specified as traumatic: Secondary | ICD-10-CM

## 2015-06-08 ENCOUNTER — Ambulatory Visit
Admission: RE | Admit: 2015-06-08 | Discharge: 2015-06-08 | Disposition: A | Discharge status: Home / Self Care | Payer: Medicare Other | Source: Ambulatory Visit | Attending: Orthopedic Surgery | Admitting: Orthopedic Surgery

## 2015-06-08 DIAGNOSIS — M75101 Unspecified rotator cuff tear or rupture of right shoulder, not specified as traumatic: Secondary | ICD-10-CM

## 2015-06-09 ENCOUNTER — Ambulatory Visit: Payer: Medicare Other | Admitting: Physical Therapy

## 2015-06-11 ENCOUNTER — Ambulatory Visit: Payer: Medicare Other | Admitting: Physical Therapy

## 2015-06-15 ENCOUNTER — Other Ambulatory Visit: Payer: Medicare Other

## 2015-06-16 ENCOUNTER — Ambulatory Visit: Payer: Medicare Other | Attending: Orthopedic Surgery | Admitting: Physical Therapy

## 2015-06-16 DIAGNOSIS — M25511 Pain in right shoulder: Secondary | ICD-10-CM | POA: Insufficient documentation

## 2015-06-16 NOTE — Therapy (Signed)
[facility] [address]  [address] [place], [place], [address] Phone: [phone]   Fax:  [phone]  Physical Therapy Treatment  Patient Details  Name: [name] [name] MRN: [id number] Date of Birth: [birth year] Referring Provider: [name]  Encounter Date: 06/16/2015   There were no vitals filed for this visit.  Visit Diagnosis:  Pain in joint, shoulder region, right        Plan - 06/16/15 1643    Clinical Impression Statement Mrs. [place] recently underwent MRI to R Shoulder; however, MD is out of town for next 2-3 weeks and she is not aware of MRI results.  Due to this, I reviewed results with her today and told her we will place her on hold from PT until she speaks with MD.  MRI indicated various issues to include full thickness supraspin tear, labral tear, and GH condral thinning and she will likely require additional surgery and shoulder replacement has already been discussed per pt.   PT Next Visit Plan on hold pending MD visit/discussion   Consulted and Agree with Plan of Care Patient        Problem List Patient Active Problem List   Diagnosis Date Noted  . Chest pain 05/09/2013  . Hypertension 02/23/2012  . Hyperlipidemia 02/23/2012  . Right shoulder pain 01/25/2012  . Left wrist pain 01/16/2012  . Neck pain 12/26/2011    Laelah Siravo PT, OCS 06/16/2015, 4:48 PM  [facility] [address]  [address] [place], [place], [address] Phone: [phone]   Fax:  [phone]  Name: [name] [name] MRN: [id number] Date of Birth: [birth year]

## 2015-06-18 ENCOUNTER — Ambulatory Visit: Payer: Medicare Other | Admitting: Physical Therapy

## 2015-07-04 ENCOUNTER — Encounter (HOSPITAL_BASED_OUTPATIENT_CLINIC_OR_DEPARTMENT_OTHER): Payer: Self-pay | Admitting: *Deleted

## 2015-07-04 NOTE — Progress Notes (Signed)
Bring all medications. Plans to come Wednesday for EKG.

## 2015-07-07 ENCOUNTER — Other Ambulatory Visit: Payer: Self-pay | Admitting: Physician Assistant

## 2015-07-07 NOTE — H&P (Signed)
[name] [name] comes in for follow up.  She continues to struggle in therapy.  Her main issue here is continued subacromial pain, as well as inability to get her motion back.  She makes advances in therapy, but then tends to take two steps back for every three steps forward.  She is really doing her part and is continuing to struggle.  This is a straightforward debridement of calcific deposits and repair of her cuff done by me on December 19, 2014.  We subsequently worked her up for radicular issues from her neck, but I don't think that is an ongoing issue.  We did do a subacromial injection which helped with Marcaine in place, but really didn't get much improvement beyond that.  I have reviewed all of her therapy notes to date and went over this with her.  Her history is reviewed.  Notes from Dr. Ron Agee reviewed.  She does have a mild disc protrusion at C5-6, but again this is looking more like her shoulder than her neck. Remaining history and general exam is reviewed.     EXAMINATION: Specifically, she continues to have subacromial irritation.  She definitely has a component of adhesive capsulitis.  I can't tell how much of this is true adhesions and how much is pain mediated, but she lacks a good 30% of her motion.  Painful at end points.    DISPOSITION:  At this point in time this may just be post-op adhesions and adhesive capsulitis.  I want to be sure her repair is okay.  We cannot do an enhanced MRI because of an allergy, but I am going to get a straightforward MRI to look at her cuff.  She will follow up with me when that is complete, tailoring further treatment depending on what that shows.  If it looks like her cuff repair is intact I would then consider exam and manipulation under anesthesia.  Discuss this with her when we finish her scan.  We are going to hold off on therapy from now until then.    Ninetta Lights, M.D.  Addendum:  Unfortunately she has a new tear of her cuff, full thickness,  partial width, adjacent to where it was repaired in the past.  Previous resection long head biceps tendon.  I met with [name] [name] at some length today, spending more than 25 minutes face-to-face.  She is miserable.  We have no choice but to try to once again repair her cuff.  What is most concerning is the fact that there was not an event causing a re-tear of her cuff.  The issue here is tissue quality.  At this point we really have no choice, as she does not have good function and she has significant pain.  She understands my trepidation and worry about this holding up over time.  She has elected to proceed and I agree with that.  Procedure, risks, benefits and complications reviewed.  Paperwork complete.  All questions answered.  See her at the time of operative intervention.    Ninetta Lights, M.D.

## 2015-07-08 NOTE — Progress Notes (Signed)
HPI: FU hypertension. Myoview in 2009 showed an ejection fraction of 64% and normal perfusion. Echocardiogram in March 2015 showed normal LV function, grade 1 diastolic dysfunction and mild mitral regurgitation. Since she was last seen, the patient denies any dyspnea on exertion, orthopnea, PND, pedal edema, palpitations, syncope or chest pain.   Current Outpatient Prescriptions  Medication Sig Dispense Refill  . aspirin 81 MG tablet Take 81 mg by mouth daily.    . Biotin 5 MG CAPS Take 1 capsule by mouth daily.    . Cholecalciferol (VITAMIN D3) 2000 UNITS capsule Take 2,000 Units by mouth daily.    Marland Kitchen. doxycycline (VIBRAMYCIN) 100 MG capsule Take 100 mg by mouth as needed (rosacia).    Marland Kitchen. OLIVE LEAF EXTRACT PO Take 1 tablet by mouth daily.    Marland Kitchen. olmesartan (BENICAR) 40 MG tablet Take 0.5 tablets (20 mg total) by mouth daily. 45 tablet 1  . PREMARIN 1.25 MG tablet Take 1.25 mg by mouth daily.     Marland Kitchen. Resveratrol 250 MG CAPS Take 1 capsule by mouth daily.     No current facility-administered medications for this visit.     Past Medical History  Diagnosis Date  . Allergy   . Hypertension   . Hyperlipidemia   . IBS (irritable bowel syndrome)   . Arthritis   . Full dentures   . Wears glasses     readers  . PONV (postoperative nausea and vomiting)   . Asthma     Bronchial   . Headache     migraines  . Celiac disease     Past Surgical History  Procedure Laterality Date  . Shoulder surgery      left  . Elbow surgery    . Knee surgery      lt  . Carpal tunnel release      rt  . Varicose vein surgery    . Nasal septum surgery    . Abdominal hysterectomy    . Cholecystectomy    . Appendectomy    . Tonsillectomy and adenoidectomy    . Breast lumpectomy  1985    lt  . Eye surgery      both cataracts  . Wrist arthroscopy Right 09/20/2013    Procedure: RIGHT ARTHROSCOPY WRIST EXCISION PISIFORM;  Surgeon: [name], MD;  Location: [facility];   Service: Orthopedics;  Laterality: Right;  . Shoulder arthroscopy with bicepstenotomy Right 05/09/2014    Procedure: SHOULDER ARTHROSCOPY WITH BICEPSTENOTOMY;  Surgeon: [name], MD;  Location: [facility];  Service: Orthopedics;  Laterality: Right;  Debridement Rotator Cuff Tear  . Shoulder acromioplasty Right 05/09/2014    Procedure: SHOULDER ACROMIOPLASTY;  Surgeon: [name], MD;  Location: [facility];  Service: Orthopedics;  Laterality: Right;  . Shoulder arthroscopy with rotator cuff repair Right 12/19/2014    Procedure: RIGHT SHOULDER ARTHROSCOPY WITH ROTATOR CUFF REPAIR,CALCIFIC DEBRIDEMENT EXTENSIVE;  Surgeon: [name], MD;  Location: [facility];  Service: Orthopedics;  Laterality: Right;    Social History   Social History  . Marital Status: Married    Spouse Name: N/A  . Number of Children: 3  . Years of Education: N/A   Occupational History  .      Safety consultant   Social History Main Topics  . Smoking status: Never Smoker   . Smokeless tobacco: Not on file  . Alcohol Use: Yes     Comment:  Occasional glass of wine  . Drug Use: No  . Sexual Activity: Not on file   Other Topics Concern  . Not on file   Social History Narrative    Family History  Problem Relation Age of Onset  . Hypertension Mother   . Heart attack Father     MI at age 15  . Diabetes Neg Hx   . Sudden death Neg Hx   . Hyperlipidemia Other   . Hypertension Other     ROS: Right shoulder pain but no fevers or chills, productive cough, hemoptysis, dysphasia, odynophagia, melena, hematochezia, dysuria, hematuria, rash, seizure activity, orthopnea, PND, pedal edema, claudication. Remaining systems are negative.  Physical Exam: Well-developed well-nourished in no acute distress.  Skin is warm and dry.  HEENT is normal.  Neck is supple.  Chest is clear to auscultation with normal expansion.  Cardiovascular exam is regular rate and  rhythm.  Abdominal exam nontender or distended. No masses palpated. Extremities show no edema. neuro grossly intact  ECG Sinus rhythm at a rate of 73. Left anterior fascicular block.

## 2015-07-09 ENCOUNTER — Ambulatory Visit (INDEPENDENT_AMBULATORY_CARE_PROVIDER_SITE_OTHER): Payer: Medicare Other | Admitting: Cardiology

## 2015-07-09 ENCOUNTER — Encounter: Payer: Self-pay | Admitting: Cardiology

## 2015-07-09 VITALS — BP 171/82 | HR 74 | Ht 65.0 in | Wt 158.8 lb

## 2015-07-09 DIAGNOSIS — Z01810 Encounter for preprocedural cardiovascular examination: Secondary | ICD-10-CM | POA: Diagnosis not present

## 2015-07-09 DIAGNOSIS — I10 Essential (primary) hypertension: Secondary | ICD-10-CM | POA: Diagnosis not present

## 2015-07-09 MED ORDER — OLMESARTAN MEDOXOMIL 40 MG PO TABS: 40.0000 mg | ORAL_TABLET | Freq: Every day | ORAL | Status: DC

## 2015-07-09 NOTE — Patient Instructions (Signed)
Your physician wants you to follow-up in: ONE YEAR WITH DR CRENSHAW You will receive a reminder letter in the mail two months in advance. If you don't receive a letter, please call our office to schedule the follow-up appointment.   If you need a refill on your cardiac medications before your next appointment, please call your pharmacy.  

## 2015-07-09 NOTE — Anesthesia Preprocedure Evaluation (Addendum)
Anesthesia Evaluation  Patient identified by MRN, date of birth, ID band Patient awake    Reviewed: Allergy & Precautions, NPO status , Patient's Chart, lab work & pertinent test results  History of Anesthesia Complications Negative for: history of anesthetic complications  Airway Mallampati: II  TM Distance: >3 FB Neck ROM: Full    Dental  (+) Teeth Intact   Pulmonary neg pulmonary ROS,    breath sounds clear to auscultation       Cardiovascular hypertension, Pt. on medications (-) angina(-) Past MI and (-) CHF (-) dysrhythmias  Rhythm:Irregular     Neuro/Psych negative neurological ROS  negative psych ROS   GI/Hepatic negative GI ROS, Neg liver ROS,   Endo/Other  negative endocrine ROS  Renal/GU negative Renal ROS     Musculoskeletal  (+) Arthritis ,   Abdominal   Peds  Hematology negative hematology ROS (+)   Anesthesia Other Findings   Reproductive/Obstetrics                            Anesthesia Physical  Anesthesia Plan  ASA: II  Anesthesia Plan: General and Regional   Post-op Pain Management: GA combined w/ Regional for post-op pain   Induction: Intravenous  Airway Management Planned: Oral ETT  Additional Equipment: None  Intra-op Plan:   Post-operative Plan: Extubation in OR  Informed Consent: I have reviewed the patients History and Physical, chart, labs and discussed the procedure including the risks, benefits and alternatives for the proposed anesthesia with the patient or authorized representative who has indicated his/her understanding and acceptance.   Dental advisory given  Plan Discussed with: Surgeon and CRNA  Anesthesia Plan Comments:         Anesthesia Quick Evaluation

## 2015-07-09 NOTE — Progress Notes (Signed)
Checked epic for Ekg through out the day still not in computer. Called Cardiology office [facility] Jens Somrenshaw. Office is suppose to fax Ekg asap since pt is having surgery tomorrow.

## 2015-07-09 NOTE — Assessment & Plan Note (Signed)
Patient is scheduled for right shoulder surgery. She has excellent functional capacity with no chest pain or dyspnea. Electrocardiogram shows no ST changes. Shoulder surgery is not high risk. She may proceed without further cardiac evaluation.

## 2015-07-09 NOTE — Assessment & Plan Note (Signed)
Patient's blood pressure is mildly elevated today. However she follows this at home and systolic is typically 130 to 135. Continue present medications and follow.

## 2015-07-09 NOTE — Assessment & Plan Note (Signed)
Management per primary care. 

## 2015-07-10 ENCOUNTER — Encounter (HOSPITAL_BASED_OUTPATIENT_CLINIC_OR_DEPARTMENT_OTHER): Payer: Self-pay | Admitting: *Deleted

## 2015-07-10 ENCOUNTER — Ambulatory Visit (HOSPITAL_BASED_OUTPATIENT_CLINIC_OR_DEPARTMENT_OTHER): Payer: Medicare Other | Admitting: Anesthesiology

## 2015-07-10 ENCOUNTER — Encounter (HOSPITAL_BASED_OUTPATIENT_CLINIC_OR_DEPARTMENT_OTHER)
Admission: RE | Disposition: A | Discharge status: Home / Self Care | Payer: Self-pay | Source: Ambulatory Visit | Attending: Orthopedic Surgery

## 2015-07-10 ENCOUNTER — Ambulatory Visit (HOSPITAL_BASED_OUTPATIENT_CLINIC_OR_DEPARTMENT_OTHER)
Admission: RE | Admit: 2015-07-10 | Discharge: 2015-07-10 | Disposition: A | Discharge status: Home / Self Care | Payer: Medicare Other | Source: Ambulatory Visit | Attending: Orthopedic Surgery | Admitting: Orthopedic Surgery

## 2015-07-10 DIAGNOSIS — M75101 Unspecified rotator cuff tear or rupture of right shoulder, not specified as traumatic: Secondary | ICD-10-CM | POA: Insufficient documentation

## 2015-07-10 DIAGNOSIS — I10 Essential (primary) hypertension: Secondary | ICD-10-CM | POA: Insufficient documentation

## 2015-07-10 DIAGNOSIS — M7501 Adhesive capsulitis of right shoulder: Secondary | ICD-10-CM | POA: Diagnosis not present

## 2015-07-10 HISTORY — DX: Headache: R51

## 2015-07-10 HISTORY — DX: Headache, unspecified: R51.9

## 2015-07-10 HISTORY — DX: Celiac disease: K90.0

## 2015-07-10 HISTORY — PX: SHOULDER ARTHROSCOPY WITH ROTATOR CUFF REPAIR: SHX5685

## 2015-07-10 SURGERY — ARTHROSCOPY, SHOULDER, WITH ROTATOR CUFF REPAIR
Anesthesia: Regional | Site: Shoulder | Laterality: Right

## 2015-07-10 MED ORDER — LIDOCAINE HCL (CARDIAC) 20 MG/ML IV SOLN
INTRAVENOUS | Status: AC
  Filled 2015-07-10: qty 5

## 2015-07-10 MED ORDER — EPHEDRINE SULFATE 50 MG/ML IJ SOLN
INTRAMUSCULAR | Status: AC
  Filled 2015-07-10: qty 1

## 2015-07-10 MED ORDER — LACTATED RINGERS IV SOLN: INTRAVENOUS | Status: DC

## 2015-07-10 MED ORDER — DEXAMETHASONE SODIUM PHOSPHATE 10 MG/ML IJ SOLN
INTRAMUSCULAR | Status: AC
  Filled 2015-07-10: qty 1

## 2015-07-10 MED ORDER — SUCCINYLCHOLINE CHLORIDE 20 MG/ML IJ SOLN
INTRAMUSCULAR | Status: DC | PRN
  Administered 2015-07-10: 100 mg via INTRAVENOUS

## 2015-07-10 MED ORDER — BUPIVACAINE-EPINEPHRINE (PF) 0.5% -1:200000 IJ SOLN
INTRAMUSCULAR | Status: DC | PRN
  Administered 2015-07-10: 25 mL via PERINEURAL

## 2015-07-10 MED ORDER — HYDROMORPHONE HCL 2 MG PO TABS: ORAL_TABLET | ORAL | Status: DC

## 2015-07-10 MED ORDER — HYDROMORPHONE HCL 1 MG/ML IJ SOLN: 0.2500 mg | INTRAMUSCULAR | Status: DC | PRN

## 2015-07-10 MED ORDER — ONDANSETRON HCL 4 MG/2ML IJ SOLN
INTRAMUSCULAR | Status: AC
  Filled 2015-07-10: qty 2

## 2015-07-10 MED ORDER — ONDANSETRON HCL 4 MG PO TABS: 4.0000 mg | ORAL_TABLET | Freq: Three times a day (TID) | ORAL | Status: DC | PRN

## 2015-07-10 MED ORDER — CHLORHEXIDINE GLUCONATE 4 % EX LIQD: 60.0000 mL | Freq: Once | CUTANEOUS | Status: DC

## 2015-07-10 MED ORDER — SODIUM CHLORIDE 0.9 % IR SOLN
Status: DC | PRN
  Administered 2015-07-10: 6000 mL

## 2015-07-10 MED ORDER — ONDANSETRON HCL 4 MG/2ML IJ SOLN
INTRAMUSCULAR | Status: DC | PRN
Start: 2015-07-10 — End: 2015-07-10
  Administered 2015-07-10: 4 mg via INTRAVENOUS

## 2015-07-10 MED ORDER — GLYCOPYRROLATE 0.2 MG/ML IJ SOLN: 0.2000 mg | Freq: Once | INTRAMUSCULAR | Status: DC | PRN

## 2015-07-10 MED ORDER — BUPIVACAINE HCL (PF) 0.25 % IJ SOLN
INTRAMUSCULAR | Status: AC
  Filled 2015-07-10: qty 30

## 2015-07-10 MED ORDER — PROPOFOL 10 MG/ML IV BOLUS
INTRAVENOUS | Status: DC | PRN
  Administered 2015-07-10: 120 mg via INTRAVENOUS
  Administered 2015-07-10: 20 mg via INTRAVENOUS

## 2015-07-10 MED ORDER — MIDAZOLAM HCL 2 MG/2ML IJ SOLN
INTRAMUSCULAR | Status: AC
  Filled 2015-07-10: qty 2

## 2015-07-10 MED ORDER — DEXAMETHASONE SODIUM PHOSPHATE 4 MG/ML IJ SOLN
INTRAMUSCULAR | Status: DC | PRN
Start: 2015-07-10 — End: 2015-07-10
  Administered 2015-07-10: 10 mg via INTRAVENOUS

## 2015-07-10 MED ORDER — FENTANYL CITRATE (PF) 100 MCG/2ML IJ SOLN
INTRAMUSCULAR | Status: AC
  Filled 2015-07-10: qty 2

## 2015-07-10 MED ORDER — CLINDAMYCIN PHOSPHATE 900 MG/50ML IV SOLN
900.0000 mg | INTRAVENOUS | Status: AC
  Administered 2015-07-10: 900 mg via INTRAVENOUS

## 2015-07-10 MED ORDER — PHENYLEPHRINE HCL 10 MG/ML IJ SOLN
INTRAMUSCULAR | Status: DC | PRN
Start: 2015-07-10 — End: 2015-07-10
  Administered 2015-07-10: 20 ug via INTRAVENOUS
  Administered 2015-07-10: 40 ug via INTRAVENOUS
  Administered 2015-07-10: 20 ug via INTRAVENOUS

## 2015-07-10 MED ORDER — CLINDAMYCIN PHOSPHATE 900 MG/50ML IV SOLN
INTRAVENOUS | Status: AC
  Filled 2015-07-10: qty 50

## 2015-07-10 MED ORDER — SUCCINYLCHOLINE CHLORIDE 20 MG/ML IJ SOLN
INTRAMUSCULAR | Status: AC
  Filled 2015-07-10: qty 1

## 2015-07-10 MED ORDER — NEOSTIGMINE METHYLSULFATE 10 MG/10ML IV SOLN
INTRAVENOUS | Status: AC
  Filled 2015-07-10: qty 1

## 2015-07-10 MED ORDER — FENTANYL CITRATE (PF) 100 MCG/2ML IJ SOLN
INTRAMUSCULAR | Status: AC
Start: 2015-07-10 — End: 2015-07-10
  Filled 2015-07-10: qty 2

## 2015-07-10 MED ORDER — HYDROCODONE-ACETAMINOPHEN 7.5-325 MG PO TABS: 1.0000 | ORAL_TABLET | Freq: Once | ORAL | Status: DC | PRN

## 2015-07-10 MED ORDER — ONDANSETRON HCL 4 MG/2ML IJ SOLN
4.0000 mg | Freq: Once | INTRAMUSCULAR | Status: AC | PRN
  Administered 2015-07-10: 4 mg via INTRAVENOUS

## 2015-07-10 MED ORDER — EPHEDRINE SULFATE 50 MG/ML IJ SOLN
INTRAMUSCULAR | Status: DC | PRN
  Administered 2015-07-10 (×2): 10 mg via INTRAVENOUS

## 2015-07-10 MED ORDER — MIDAZOLAM HCL 2 MG/2ML IJ SOLN
1.0000 mg | INTRAMUSCULAR | Status: DC | PRN
  Administered 2015-07-10: 1 mg via INTRAVENOUS

## 2015-07-10 MED ORDER — BUPIVACAINE HCL (PF) 0.5 % IJ SOLN
INTRAMUSCULAR | Status: AC
  Filled 2015-07-10: qty 30

## 2015-07-10 MED ORDER — SCOPOLAMINE 1 MG/3DAYS TD PT72: 1.0000 | MEDICATED_PATCH | Freq: Once | TRANSDERMAL | Status: DC | PRN

## 2015-07-10 MED ORDER — LACTATED RINGERS IV SOLN
INTRAVENOUS | Status: DC
  Administered 2015-07-10: 07:00:00 via INTRAVENOUS

## 2015-07-10 MED ORDER — LIDOCAINE HCL (CARDIAC) 20 MG/ML IV SOLN
INTRAVENOUS | Status: DC | PRN
  Administered 2015-07-10: 50 mg via INTRAVENOUS

## 2015-07-10 MED ORDER — PROPOFOL 500 MG/50ML IV EMUL
INTRAVENOUS | Status: AC
  Filled 2015-07-10: qty 50

## 2015-07-10 MED ORDER — FENTANYL CITRATE (PF) 100 MCG/2ML IJ SOLN
50.0000 ug | INTRAMUSCULAR | Status: DC | PRN
  Administered 2015-07-10 (×2): 50 ug via INTRAVENOUS

## 2015-07-10 SURGICAL SUPPLY — 75 items
ANCH SUT SWLK 19.1X4.75 (Anchor) ×2 IMPLANT
ANCHOR SUT BIO SW 4.75X19.1 (Anchor) ×6 IMPLANT
APL SKNCLS STERI-STRIP NONHPOA (GAUZE/BANDAGES/DRESSINGS)
BENZOIN TINCTURE PRP APPL 2/3 (GAUZE/BANDAGES/DRESSINGS) IMPLANT
BLADE CUTTER GATOR 3.5 (BLADE) ×3 IMPLANT
BLADE CUTTER MENIS 5.5 (BLADE) IMPLANT
BLADE GREAT WHITE 4.2 (BLADE) ×2 IMPLANT
BLADE GREAT WHITE 4.2MM (BLADE) ×1
BLADE SURG 15 STRL LF DISP TIS (BLADE) IMPLANT
BLADE SURG 15 STRL SS (BLADE)
BUR OVAL 6.0 (BURR) ×3 IMPLANT
CANNULA DRY DOC 8X75 (CANNULA) ×3 IMPLANT
CANNULA TWIST IN 8.25X7CM (CANNULA) IMPLANT
CLOSURE WOUND 1/2 X4 (GAUZE/BANDAGES/DRESSINGS)
DECANTER SPIKE VIAL GLASS SM (MISCELLANEOUS) IMPLANT
DRAPE OEC MINIVIEW 54X84 (DRAPES) IMPLANT
DRAPE STERI 35X30 U-POUCH (DRAPES) ×3 IMPLANT
DRAPE U-SHAPE 47X51 STRL (DRAPES) ×3 IMPLANT
DRAPE U-SHAPE 76X120 STRL (DRAPES) ×6 IMPLANT
DRSG PAD ABDOMINAL 8X10 ST (GAUZE/BANDAGES/DRESSINGS) ×3 IMPLANT
DURAPREP 26ML APPLICATOR (WOUND CARE) ×3 IMPLANT
ELECT MENISCUS 165MM 90D (ELECTRODE) ×3 IMPLANT
ELECT REM PT RETURN 9FT ADLT (ELECTROSURGICAL) ×3
ELECTRODE REM PT RTRN 9FT ADLT (ELECTROSURGICAL) ×1 IMPLANT
GAUZE SPONGE 4X4 12PLY STRL (GAUZE/BANDAGES/DRESSINGS) ×6 IMPLANT
GAUZE XEROFORM 1X8 LF (GAUZE/BANDAGES/DRESSINGS) ×3 IMPLANT
GLOVE BIO SURGEON STRL SZ7 (GLOVE) ×3 IMPLANT
GLOVE BIOGEL PI IND STRL 7.0 (GLOVE) ×4 IMPLANT
GLOVE BIOGEL PI INDICATOR 7.0 (GLOVE) ×8
GLOVE ECLIPSE 6.5 STRL STRAW (GLOVE) ×3 IMPLANT
GLOVE ECLIPSE 7.0 STRL STRAW (GLOVE) ×3 IMPLANT
GLOVE SURG ORTHO 8.0 STRL STRW (GLOVE) ×3 IMPLANT
GOWN STRL REUS W/ TWL LRG LVL3 (GOWN DISPOSABLE) ×3 IMPLANT
GOWN STRL REUS W/ TWL XL LVL3 (GOWN DISPOSABLE) ×1 IMPLANT
GOWN STRL REUS W/TWL LRG LVL3 (GOWN DISPOSABLE) ×9
GOWN STRL REUS W/TWL XL LVL3 (GOWN DISPOSABLE) ×3
IV NS IRRIG 3000ML ARTHROMATIC (IV SOLUTION) ×6 IMPLANT
MANIFOLD NEPTUNE II (INSTRUMENTS) ×3 IMPLANT
NDL SUT 6 .5 CRC .975X.05 MAYO (NEEDLE) IMPLANT
NEEDLE MAYO TAPER (NEEDLE)
NEEDLE SCORPION MULTI FIRE (NEEDLE) ×3 IMPLANT
NS IRRIG 1000ML POUR BTL (IV SOLUTION) IMPLANT
PACK ARTHROSCOPY DSU (CUSTOM PROCEDURE TRAY) ×3 IMPLANT
PACK BASIN DAY SURGERY FS (CUSTOM PROCEDURE TRAY) ×3 IMPLANT
PASSER SUT SWANSON 36MM LOOP (INSTRUMENTS) IMPLANT
PENCIL BUTTON HOLSTER BLD 10FT (ELECTRODE) ×3 IMPLANT
SET ARTHROSCOPY TUBING (MISCELLANEOUS) ×3
SET ARTHROSCOPY TUBING LN (MISCELLANEOUS) ×1 IMPLANT
SLEEVE SCD COMPRESS KNEE MED (MISCELLANEOUS) ×3 IMPLANT
SLING ARM FOAM STRAP LRG (SOFTGOODS) IMPLANT
SLING ARM IMMOBILIZER LRG (SOFTGOODS) ×3 IMPLANT
SLING ARM IMMOBILIZER MED (SOFTGOODS) IMPLANT
SLING ARM MED ADULT FOAM STRAP (SOFTGOODS) IMPLANT
SLING ARM XL FOAM STRAP (SOFTGOODS) IMPLANT
SPONGE LAP 4X18 X RAY DECT (DISPOSABLE) IMPLANT
STRIP CLOSURE SKIN 1/2X4 (GAUZE/BANDAGES/DRESSINGS) IMPLANT
SUCTION FRAZIER HANDLE 10FR (MISCELLANEOUS)
SUCTION TUBE FRAZIER 10FR DISP (MISCELLANEOUS) IMPLANT
SUT ETHIBOND 2 OS 4 DA (SUTURE) IMPLANT
SUT ETHILON 2 0 FS 18 (SUTURE) IMPLANT
SUT ETHILON 3 0 PS 1 (SUTURE) ×3 IMPLANT
SUT FIBERWIRE #2 38 T-5 BLUE (SUTURE)
SUT RETRIEVER MED (INSTRUMENTS) IMPLANT
SUT TIGER TAPE 7 IN WHITE (SUTURE) ×3 IMPLANT
SUT VIC AB 0 CT1 27 (SUTURE)
SUT VIC AB 0 CT1 27XBRD ANBCTR (SUTURE) IMPLANT
SUT VIC AB 2-0 SH 27 (SUTURE)
SUT VIC AB 2-0 SH 27XBRD (SUTURE) IMPLANT
SUT VIC AB 3-0 FS2 27 (SUTURE) IMPLANT
SUTURE FIBERWR #2 38 T-5 BLUE (SUTURE) IMPLANT
TAPE FIBER 2MM 7IN #2 BLUE (SUTURE) ×3 IMPLANT
TOWEL OR 17X24 6PK STRL BLUE (TOWEL DISPOSABLE) ×3 IMPLANT
TOWEL OR NON WOVEN STRL DISP B (DISPOSABLE) ×3 IMPLANT
WATER STERILE IRR 1000ML POUR (IV SOLUTION) ×3 IMPLANT
YANKAUER SUCT BULB TIP NO VENT (SUCTIONS) IMPLANT

## 2015-07-10 NOTE — Anesthesia Postprocedure Evaluation (Signed)
Anesthesia Post Note  Patient: [name] HampshireLynn C Yglesias  Procedure(s) Performed: Procedure(s) (LRB): RIGHT SHOULDER ARTHROSCOPY, DEBRIDEMENT  WITH ROTATOR CUFF REPAIR (Right)  Patient location during evaluation: PACU Anesthesia Type: General Level of consciousness: awake Pain management: pain level controlled Vital Signs Assessment: post-procedure vital signs reviewed and stable Respiratory status: spontaneous breathing Cardiovascular status: stable Postop Assessment: no signs of nausea or vomiting Anesthetic complications: no    Last Vitals:  Filed Vitals:   07/10/15 0930 07/10/15 1026  BP: 139/75 141/88  Pulse: 81 76  Temp:  36.6 C  Resp: 20 16    Last Pain:  Filed Vitals:   07/10/15 1027  PainSc: 0-No pain                 Sanaa Zilberman JR,JOHN Camelia Stelzner

## 2015-07-10 NOTE — Discharge Instructions (Signed)
Shouder arthroscopy, rotator cuff repair, subacromial decompression °Care After Instructions °Refer to this sheet in the next few weeks. These discharge instructions provide you with general information on caring for yourself after you leave the hospital. Your caregiver may also give you specific instructions. Your treatment has been planned according to the most current medical practices available, but unavoidable complications sometimes occur. If you have any problems or questions after discharge, please call your caregiver. °HOME INSTRUCTIONS °You may resume a normal diet and activities as directed.  °Take showers instead of baths until informed otherwise.  °Change bandages (dressings) in 3 days.  Swab wounds daily with betadine.  Wash shoulder with soap and water.  Pat dry.  Cover wounds with bandaids. °Only take over-the-counter or prescription medicines for pain, discomfort, or fever as directed by your caregiver.  °Wear your sling for the next 6 weeks unless otherwise instructed. °Eat a well-balanced diet.  °Avoid lifting or driving until you are instructed otherwise.  °Make an appointment to see your caregiver for stitches (suture) or staple removal one week after surgery.  ° °SEEK MEDICAL CARE IF: °You have swelling of your calf or leg.  °You develop shortness of breath or chest pain.  °You have redness, swelling, or increasing pain in the wound.  °There is pus or any unusual drainage coming from the surgical site.  °You notice a bad smell coming from the surgical site or dressing.  °The surgical site breaks open after sutures or staples have been removed.  °There is persistent bleeding from the suture or staple line.  °You are getting worse or are not improving.  °You have any other questions or concerns.  °SEEK IMMEDIATE MEDICAL CARE IF:  °You have a fever greater than 101 °You develop a rash.  °You have difficulty breathing.  °You develop any reaction or side effects to medicines given.  °Your knee  motion is decreasing rather than improving.  °MAKE SURE YOU:  °Understand these instructions.  °Will watch your condition.  °Will get help right away if you are not doing well or get worse.  ° ° ° °Regional Anesthesia Blocks ° °1. Numbness or the inability to move the "blocked" extremity may last from 3-48 hours after placement. The length of time depends on the medication injected and your individual response to the medication. If the numbness is not going away after 48 hours, call your surgeon. ° °2. The extremity that is blocked will need to be protected until the numbness is gone and the  Strength has returned. Because you cannot feel it, you will need to take extra care to avoid injury. Because it may be weak, you may have difficulty moving it or using it. You may not know what position it is in without looking at it while the block is in effect. ° °3. For blocks in the legs and feet, returning to weight bearing and walking needs to be done carefully. You will need to wait until the numbness is entirely gone and the strength has returned. You should be able to move your leg and foot normally before you try and bear weight or walk. You will need someone to be with you when you first try to ensure you do not fall and possibly risk injury. ° °4. Bruising and tenderness at the needle site are common side effects and will resolve in a few days. ° °5. Persistent numbness or new problems with movement should be communicated to the surgeon or the [facility] (  [phone])/ [facility] ([phone]). ° ° ° ° ° ° °Post Anesthesia Home Care Instructions ° °Activity: °Get plenty of rest for the remainder of the day. A responsible adult should stay with you for 24 hours following the procedure.  °For the next 24 hours, DO NOT: °-Drive a car °-Operate machinery °-Drink alcoholic beverages °-Take any medication unless instructed by your physician °-Make any legal decisions or sign important  papers. ° °Meals: °Start with liquid foods such as gelatin or soup. Progress to regular foods as tolerated. Avoid greasy, spicy, heavy foods. If nausea and/or vomiting occur, drink only clear liquids until the nausea and/or vomiting subsides. Call your physician if vomiting continues. ° °Special Instructions/Symptoms: °Your throat may feel dry or sore from the anesthesia or the breathing tube placed in your throat during surgery. If this causes discomfort, gargle with warm salt water. The discomfort should disappear within 24 hours. ° °If you had a scopolamine patch placed behind your ear for the management of post- operative nausea and/or vomiting: ° °1. The medication in the patch is effective for 72 hours, after which it should be removed.  Wrap patch in a tissue and discard in the trash. Wash hands thoroughly with soap and water. °2. You may remove the patch earlier than 72 hours if you experience unpleasant side effects which may include dry mouth, dizziness or visual disturbances. °3. Avoid touching the patch. Wash your hands with soap and water after contact with the patch. °  ° °

## 2015-07-10 NOTE — Addendum Note (Signed)
Addendum  created 07/10/15 1339 by Adair LaundryLynn A Nabiha Planck, CRNA   Modules edited: Anesthesia Medication Administration

## 2015-07-10 NOTE — Anesthesia Procedure Notes (Addendum)
Procedure Name: Intubation Performed by: York GricePEARSON, DONNA W Pre-anesthesia Checklist: Patient identified, Emergency Drugs available, Suction available and Patient being monitored Patient Re-evaluated:Patient Re-evaluated prior to inductionOxygen Delivery Method: Circle System Utilized Preoxygenation: Pre-oxygenation with 100% oxygen Intubation Type: IV induction Ventilation: Mask ventilation without difficulty Laryngoscope Size: Miller and 2 Grade View: Grade I Tube type: Oral Tube size: 7.0 mm Number of attempts: 1 Airway Equipment and Method: Stylet and Oral airway Placement Confirmation: ETT inserted through vocal cords under direct vision,  positive ETCO2 and breath sounds checked- equal and bilateral Secured at: 22 cm Tube secured with: Tape Dental Injury: Teeth and Oropharynx as per pre-operative assessment    Anesthesia Regional Block:  Interscalene brachial plexus block  Pre-Anesthetic Checklist: ,, timeout performed, Correct Patient, Correct Site, Correct Laterality, Correct Procedure, Correct Position, site marked, Risks and benefits discussed,  Surgical consent,  Pre-op evaluation,  At surgeon's request and post-op pain management  Laterality: Right  Prep: chloraprep       Needles:  Injection technique: Single-shot  Needle Type: Echogenic Stimulator Needle     Needle Length: 9cm 9 cm Needle Gauge: 21 and 21 G    Additional Needles:  Procedures: ultrasound guided (picture in chart) and nerve stimulator Interscalene brachial plexus block  Nerve Stimulator or Paresthesia:  Response: triceps and deltoid, 0.5 mA,   Additional Responses:   Narrative:  Start time: 07/10/2015 6:54 AM End time: 07/10/2015 7:01 AM Injection made incrementally with aspirations every 5 mL.  Performed by: Personally  Anesthesiologist: Marcene DuosFITZGERALD, Shantai Tiedeman

## 2015-07-10 NOTE — Progress Notes (Signed)
Assisted Dr. Rob Fitzgerald with right, ultrasound guided, supraclavicular block. Side rails up, monitors on throughout procedure. See vital signs in flow sheet. Tolerated Procedure well. 

## 2015-07-10 NOTE — Op Note (Signed)
NAME:  [name], [name]                       ACCOUNT NO.:  MEDICAL RECORD NO.:  [id number]  LOCATION:                                 FACILITY:  PHYSICIAN:  Loreta Aveaniel F. Caleb Decock, M.D. DATE OF BIRTH:  03-09-1946  DATE OF PROCEDURE:  07/10/2015 DATE OF DISCHARGE:                              OPERATIVE REPORT   PREOPERATIVE DIAGNOSIS:  Right shoulder recurrent rotator cuff tear after previous repair in 2016.  POSTOPERATIVE DIAGNOSES:  Right shoulder recurrent rotator cuff tear after previous repair in 2016, with failure of cuff tissue, retained sutures and anchors.  Secondary adhesive capsulitis.  PROCEDURES:  Right shoulder exam under anesthesia with manipulation. Arthroscopy with lysis of debridement and adhesions.  Debridement of rotator cuff, removal of retained sutures.  Debridement of tear with FiberWeave suture x2, SwiveLock anchors x2.  SURGEON:  Loreta Aveaniel F. Aveleen Nevers, M.D.  ASSISTANT:  Mikey KirschnerLindsey Stanberry, PA, present throughout the entire case and necessary for timely completion of procedure.  ANESTHESIA:  General.  BLOOD LOSS:  Minimal.  SPECIMENS:  None.  CULTURES:  None.  COMPLICATIONS:  None.  DRESSINGS:  Soft compressive with shoulder immobilizer.  DESCRIPTION OF PROCEDURE:  The patient was brought to the operating room and placed on the operating table in supine position.  After adequate anesthesia had been obtained, shoulder examined.  Very stiff lacking, good 25% of motion.  Manipulated, break up adhesions, achieving full motion, stable left shoulder.  Placed in beach-chair position.  Shoulder positioner, prepped and draped in usual sterile fashion.  Two portals; posterior and lateral.  Arthroscope was introduced, shoulder was distended and inspected.  Residual intra and extra-articular adhesions all removed.  Some grade 2, mild grade 3 changes and the shoulder debrided.  Biceps already had been released.  Rotator cuff had failed at the attachment laterally.  The  retaining sutures and anchors were still there.  Anchors were driven in further.  The sutures debrided.  The cuff was then mobilized, debrided back to healthy tissue.  This was still very mobile and reparable.  With a cannula laterally, I placed two horizontal mattress sutures on the cuff well, medial and to good tissue. This was re-anchored, down into good, roughened tuberosity with two prepunched SwiveLock anchors.  Again, I achieved a nice firm watertight closure of the cuff.  She already had a previous decompression, nothing further needed to be done there.  Instruments and fluids removed.  Portals were closed with nylon.  Sterile compressive dressing applied.  Anesthesia reversed.  Brought to the recovery room. Tolerated the surgery well.  No complications.     Loreta Aveaniel F. Jermika Olden, M.D.     DFM/MEDQ  D:  07/10/2015  T:  07/10/2015  Job:  [id number]

## 2015-07-10 NOTE — Interval H&P Note (Signed)
History and Physical Interval Note:  07/10/2015 7:35 AM  [name] [name]  has presented today for surgery, with the diagnosis of Other articular cartilage disorders, right shoulder, Strain of muscle(s) and tendon(s) of the rotator cuff of right shoulder, initial encounter  M24.111, S246.011A  The various methods of treatment have been discussed with the patient and family. After consideration of risks, benefits and other options for treatment, the patient has consented to  Procedure(s): RIGHT SHOULDER ARTHROSCOPY, DEBRIDEMENT  WITH ROTATOR CUFF REPAIR (Right) as a surgical intervention .  The patient's history has been reviewed, patient examined, no change in status, stable for surgery.  I have reviewed the patient's chart and labs.  Questions were answered to the patient's satisfaction.     [name]

## 2015-07-10 NOTE — Transfer of Care (Signed)
Immediate Anesthesia Transfer of Care Note  Patient: [name] [name]  Procedure(s) Performed: Procedure(s): RIGHT SHOULDER ARTHROSCOPY, DEBRIDEMENT  WITH ROTATOR CUFF REPAIR (Right)  Patient Location: PACU  Anesthesia Type:General  Level of Consciousness: awake and sedated  Airway & Oxygen Therapy: Patient Spontanous Breathing and Patient connected to face mask oxygen  Post-op Assessment: Report given to RN and Post -op Vital signs reviewed and stable  Post vital signs: Reviewed and stable  Last Vitals:  Filed Vitals:   07/10/15 0700 07/10/15 0705  BP: 158/99   Pulse: 41 90  Resp: 18 55    Complications: No apparent anesthesia complications

## 2015-07-11 ENCOUNTER — Encounter (HOSPITAL_BASED_OUTPATIENT_CLINIC_OR_DEPARTMENT_OTHER): Payer: Self-pay | Admitting: Orthopedic Surgery

## 2015-07-11 NOTE — Addendum Note (Signed)
Addendum  created 07/11/15 0715 by Jewel Baizeimothy D Lancelot Alyea, CRNA   Modules edited: Charges VN

## 2015-07-29 ENCOUNTER — Ambulatory Visit: Payer: Medicare Other | Attending: Orthopedic Surgery | Admitting: Physical Therapy

## 2015-07-29 DIAGNOSIS — M25611 Stiffness of right shoulder, not elsewhere classified: Secondary | ICD-10-CM | POA: Diagnosis present

## 2015-07-29 DIAGNOSIS — M25511 Pain in right shoulder: Secondary | ICD-10-CM | POA: Diagnosis not present

## 2015-07-29 NOTE — Therapy (Signed)
[facility] [address]  [address] [place], [place], [address] Phone: [phone]   Fax:  [phone]  Physical Therapy Evaluation  Patient Details  Name: [name] [name] MRN: [id number] Date of Birth: [birth year] Referring Provider: Mckinley Jewel, MD  Encounter Date: 07/29/2015      PT End of Session - 07/29/15 1453    Visit Number 1   Number of Visits 20   Date for PT Re-Evaluation 10/07/15   PT Start Time 1452   PT Stop Time 1546   PT Time Calculation (min) 54 min      Past Medical History  Diagnosis Date  . Allergy   . Hypertension   . Hyperlipidemia   . IBS (irritable bowel syndrome)   . Arthritis   . Full dentures   . Wears glasses     readers  . PONV (postoperative nausea and vomiting)   . Asthma     Bronchial   . Headache     migraines  . Celiac disease     Past Surgical History  Procedure Laterality Date  . Shoulder surgery      left  . Elbow surgery    . Knee surgery      lt  . Carpal tunnel release      rt  . Varicose vein surgery    . Nasal septum surgery    . Abdominal hysterectomy    . Cholecystectomy    . Appendectomy    . Tonsillectomy and adenoidectomy    . Breast lumpectomy  1985    lt  . Eye surgery      both cataracts  . Wrist arthroscopy Right 09/20/2013    Procedure: RIGHT ARTHROSCOPY WRIST EXCISION PISIFORM;  Surgeon: Wyn Forster, MD;  Location: [facility];  Service: Orthopedics;  Laterality: Right;  . Shoulder arthroscopy with bicepstenotomy Right 05/09/2014    Procedure: SHOULDER ARTHROSCOPY WITH BICEPSTENOTOMY;  Surgeon: Loreta Ave, MD;  Location: [facility];  Service: Orthopedics;  Laterality: Right;  Debridement Rotator Cuff Tear  . Shoulder acromioplasty Right 05/09/2014    Procedure: SHOULDER ACROMIOPLASTY;  Surgeon: Loreta Ave, MD;  Location: [facility];  Service: Orthopedics;  Laterality: Right;  . Shoulder  arthroscopy with rotator cuff repair Right 12/19/2014    Procedure: RIGHT SHOULDER ARTHROSCOPY WITH ROTATOR CUFF REPAIR,CALCIFIC DEBRIDEMENT EXTENSIVE;  Surgeon: Loreta Ave, MD;  Location: [facility];  Service: Orthopedics;  Laterality: Right;  . Shoulder arthroscopy with rotator cuff repair Right 07/10/2015    Procedure: RIGHT SHOULDER ARTHROSCOPY, DEBRIDEMENT  WITH ROTATOR CUFF REPAIR;  Surgeon: Loreta Ave, MD;  Location:  SURGERY CENTER;  Service: Orthopedics;  Laterality: Right;    There were no vitals filed for this visit.       Subjective Assessment - 07/29/15 1455    Subjective pt underwent R RCR revision on 07/10/15 due to re-tear of supraspin following RCR in Sept 2016.  Since her most recent surgery, pt has been wearing sling 24/7 other than bathing and intermittent removal to allow arm to hang at side.  She states her pain is a constant nagging ache which progresses to "actual pain" at night.  States is able to sleep 3-4 hours then wakes due to pain stating poor quality sleep after that.   Currently in Pain? Yes   Pain Score --  best 4/10, AVG pain 7/10, 10/10 at worst which is noted most  nights   Pain Location Shoulder   Pain Orientation Right   Pain Descriptors / Indicators Aching;Dull;Nagging   Pain Onset 1 to 4 weeks ago   Pain Frequency Constant   Aggravating Factors  pain increases throughout the day   Pain Relieving Factors meds            [facility] - 07/29/15 0001    Assessment   Medical Diagnosis s/p R RCR   Referring Provider [name], MD   Onset Date/Surgical Date 07/10/15   Hand Dominance Right   Precautions   Precaution Comments PROM only for 6-8 wks   Balance Screen   Has the patient fallen in the past 6 months No   Has the patient had a decrease in activity level because of a fear of falling?  No   Is the patient reluctant to leave their home because of a fear of falling?  No   Observation/Other  Assessments   Focus on Therapeutic Outcomes (FOTO)  93% limitation   ROM / Strength   AROM / PROM / Strength PROM   PROM   PROM Assessment Site Shoulder   Right/Left Shoulder Right   Right Shoulder Flexion 85 Degrees   Right Shoulder Internal Rotation --  to Abdomen at 0 ABD   Right Shoulder External Rotation --  10 at 0 ABD        TODAY'S TREATMENT Manual - STM R Pec in supine with grade 1 AP mob to [facility] PROM into Flexion, ER, and IR L side-lying STM to medial scapular muscles and infraspin due to pain and high tone noted           PT Short Term Goals - 07/29/15 1757    PT SHORT TERM GOAL #1   Title pt able to progress to AAROM exercises by 09/09/15   Status New   PT SHORT TERM GOAL #2   Title R Shoulder PROM Flexion to 140 and ER to 60 at 80 ABD by 09/09/15   Status New           PT Long Term Goals - 07/29/15 1758    PT LONG TERM GOAL #1   Title independent with advanced HEP as necessary by 10/07/15   Status New   PT LONG TERM GOAL #2   Title R shoulder AROM WFL and MMT 4/5 or better all planes by 10/07/15   Status New   PT LONG TERM GOAL #3   Title pt able to perform all ADLs and chores without limitation by R shoulder pain or weakness by 10/07/15   Status New               Plan - 07/29/15 1748    Clinical Impression Statement Mrs. [place] returns to PT today after undergoing R RCR after failure of last surgery performed Sept 2016.  She states she was told her tendon was very thin/weak and that her prognosis is not very good.  Her surgery was performed 07/10/15 and she arrives with PT referral for PROM only for the next 6-8 wks.  We will perform PROM into pain-free ranges but will not perform into ABD.  Pt c/o difficulty with applying deoderant to R UE stating causes shoulder pain.  I advised her to rest her elbow on counter and move her body away from the arm rather than attempt to move her arm into ABD as she has been doing to help prevent RC strain. She  states her shoulder is in  constant pain which she rates 4/10 at best, 7/10 on AVG, and 10/10 at worst.  We will address her pain with gentle manual (STM and grade 1-2 mobs) as well as modalities PRN.  In addition, she has been advised to remain in sling other than for bathing or letting arm hang at side momentarily throughout the day to help decreased pain.   PT Frequency 2x / week   PT Duration Other (comment)  10 WKS   PT Treatment/Interventions Manual techniques;Vasopneumatic Device;Taping;Passive range of motion;Patient/family education;Therapeutic exercise;Therapeutic activities;Cryotherapy;Electrical Stimulation;Moist Heat;Ultrasound   PT Next Visit Plan gentle manual for PROM and pain; modalities for pain   Consulted and Agree with Plan of Care Patient      Patient will benefit from skilled therapeutic intervention in order to improve the following deficits and impairments:  Pain, Decreased strength, Decreased mobility, Decreased range of motion, Impaired UE functional use, Postural dysfunction  Visit Diagnosis: Pain in right shoulder      G-Codes - 07/30/15 1454    Functional Assessment Tool Used FOTO 93% limitation   Functional Limitation Carrying, moving and handling objects   Carrying, Moving and Handling Objects Current Status (Z6109) At least 80 percent but less than 100 percent impaired, limited or restricted   Carrying, Moving and Handling Objects Goal Status (U0454) At least 40 percent but less than 60 percent impaired, limited or restricted       Problem List Patient Active Problem List   Diagnosis Date Noted  . Preop cardiovascular exam 07/09/2015  . Chest pain 05/09/2013  . Hypertension 02/23/2012  . Hyperlipidemia 02/23/2012  . Right shoulder pain 01/25/2012  . Left wrist pain 01/16/2012  . Neck pain 12/26/2011    Yale Golla PT, OCS 07-30-15, 6:02 PM  [facility] [address]  [address] [place], [place], [address] Phone: [phone]   Fax:  [phone]  Name: KEANA DUEITT MRN: [id number] Date of Birth: [birth year]

## 2015-08-05 ENCOUNTER — Ambulatory Visit: Payer: Medicare Other | Admitting: Physical Therapy

## 2015-08-05 DIAGNOSIS — M25611 Stiffness of right shoulder, not elsewhere classified: Secondary | ICD-10-CM

## 2015-08-05 DIAGNOSIS — M25511 Pain in right shoulder: Secondary | ICD-10-CM

## 2015-08-05 NOTE — Therapy (Signed)
[facility] [address]  [address] [place], [place], [address] Phone: [phone]   Fax:  [phone]  Physical Therapy Treatment  Patient Details  Name: [name] [name] MRN: [id number] Date of Birth: 15-Mar-1946 Referring Provider: Mckinley Jewel, MD  Encounter Date: 08/05/2015      PT End of Session - 08/05/15 0811    Visit Number 2   Number of Visits 20   Date for PT Re-Evaluation 10/07/15   PT Start Time 0810   PT Stop Time 0855   PT Time Calculation (min) 45 min      Past Medical History  Diagnosis Date  . Allergy   . Hypertension   . Hyperlipidemia   . IBS (irritable bowel syndrome)   . Arthritis   . Full dentures   . Wears glasses     readers  . PONV (postoperative nausea and vomiting)   . Asthma     Bronchial   . Headache     migraines  . Celiac disease     Past Surgical History  Procedure Laterality Date  . Shoulder surgery      left  . Elbow surgery    . Knee surgery      lt  . Carpal tunnel release      rt  . Varicose vein surgery    . Nasal septum surgery    . Abdominal hysterectomy    . Cholecystectomy    . Appendectomy    . Tonsillectomy and adenoidectomy    . Breast lumpectomy  1985    lt  . Eye surgery      both cataracts  . Wrist arthroscopy Right 09/20/2013    Procedure: RIGHT ARTHROSCOPY WRIST EXCISION PISIFORM;  Surgeon: Wyn Forster, MD;  Location: [facility];  Service: Orthopedics;  Laterality: Right;  . Shoulder arthroscopy with bicepstenotomy Right 05/09/2014    Procedure: SHOULDER ARTHROSCOPY WITH BICEPSTENOTOMY;  Surgeon: Loreta Ave, MD;  Location: [facility];  Service: Orthopedics;  Laterality: Right;  Debridement Rotator Cuff Tear  . Shoulder acromioplasty Right 05/09/2014    Procedure: SHOULDER ACROMIOPLASTY;  Surgeon: Loreta Ave, MD;  Location: [facility];  Service: Orthopedics;  Laterality: Right;  . Shoulder  arthroscopy with rotator cuff repair Right 12/19/2014    Procedure: RIGHT SHOULDER ARTHROSCOPY WITH ROTATOR CUFF REPAIR,CALCIFIC DEBRIDEMENT EXTENSIVE;  Surgeon: Loreta Ave, MD;  Location: [facility];  Service: Orthopedics;  Laterality: Right;  . Shoulder arthroscopy with rotator cuff repair Right 07/10/2015    Procedure: RIGHT SHOULDER ARTHROSCOPY, DEBRIDEMENT  WITH ROTATOR CUFF REPAIR;  Surgeon: Loreta Ave, MD;  Location: [facility];  Service: Orthopedics;  Laterality: Right;    There were no vitals filed for this visit.      Subjective Assessment - 08/05/15 0812    Subjective States R shoulder pain continues to run AVG 6/10, is sleeping 4-4.5 hours at a time in bed with a lot of pillows for support.   Currently in Pain? Yes   Pain Score 6    Pain Location Shoulder   Pain Orientation Right   Pain Radiating Towards runs from R shoulder and extends to B scapular area as pain increases throughout the day.            [facility] PT Assessment - 08/05/15 0001    Assessment   Next MD Visit 08/26/15  TODAY'S TREATMENT Manual - supine R GH grade 1 AP mobs, STM R pec, gentle PROM into ER/IR at 0-15 ABD and flexion all as tolerated L Side-lying R scapular mobs to assist with tone normalization (high tone and tenderness R UT and medial scapular mms), STM UT and medial scap mms  Vasopneumatic compression - low pressure, 38 dg, 15', R Shoulder                    PT Short Term Goals - 08/05/15 0844    PT SHORT TERM GOAL #1   Title pt able to progress to AAROM exercises by 09/09/15   Status On-going   PT SHORT TERM GOAL #2   Title R Shoulder PROM Flexion to 140 and ER to 60 at 80 ABD by 09/09/15   Status On-going           PT Long Term Goals - 08/05/15 0844    PT LONG TERM GOAL #1   Title independent with advanced HEP as necessary by 10/07/15   PT LONG TERM GOAL #2   Title R shoulder AROM WFL and MMT 4/5 or better all  planes by 10/07/15   Status On-going   PT LONG TERM GOAL #3   Title pt able to perform all ADLs and chores without limitation by R shoulder pain or weakness by 10/07/15   Status On-going               Plan - 08/05/15 0843    Clinical Impression Statement 6/10 AVG pain, limited ability sleeping; is wearing sling as advised.  Treatments to continue focus on gentle ROM and manual, modalities for pain.   PT Next Visit Plan gentle manual for PROM and pain; modalities for pain   Consulted and Agree with Plan of Care Patient      Patient will benefit from skilled therapeutic intervention in order to improve the following deficits and impairments:  Pain, Decreased strength, Decreased mobility, Decreased range of motion, Impaired UE functional use, Postural dysfunction  Visit Diagnosis: Pain in right shoulder  Stiffness of right shoulder, not elsewhere classified     Problem List Patient Active Problem List   Diagnosis Date Noted  . Preop cardiovascular exam 07/09/2015  . Chest pain 05/09/2013  . Hypertension 02/23/2012  . Hyperlipidemia 02/23/2012  . Right shoulder pain 01/25/2012  . Left wrist pain 01/16/2012  . Neck pain 12/26/2011    Marcell Chavarin PT, OCS 08/05/2015, 8:45 AM  [facility] [address]  [address] [place], [place], [address] Phone: [phone]   Fax:  [phone]  Name: [name] [name] MRN: [id number] Date of Birth: 07/29/1945

## 2015-08-07 ENCOUNTER — Ambulatory Visit: Payer: Medicare Other | Admitting: Physical Therapy

## 2015-08-07 DIAGNOSIS — M25511 Pain in right shoulder: Secondary | ICD-10-CM | POA: Diagnosis not present

## 2015-08-07 DIAGNOSIS — M25611 Stiffness of right shoulder, not elsewhere classified: Secondary | ICD-10-CM

## 2015-08-07 NOTE — Therapy (Signed)
[facility] [address]  [address] [place], [place], [address] Phone: [phone]   Fax:  [phone]  Physical Therapy Treatment  Patient Details  Name: [name] [name] MRN: [id number] Date of Birth: 1945-05-26 Referring Provider: [name], MD  Encounter Date: 08/07/2015      PT End of Session - 08/07/15 0847    Visit Number 3   Number of Visits 20   Date for PT Re-Evaluation 10/07/15   PT Start Time 0845      Past Medical History  Diagnosis Date  . Allergy   . Hypertension   . Hyperlipidemia   . IBS (irritable bowel syndrome)   . Arthritis   . Full dentures   . Wears glasses     readers  . PONV (postoperative nausea and vomiting)   . Asthma     Bronchial   . Headache     migraines  . Celiac disease     Past Surgical History  Procedure Laterality Date  . Shoulder surgery      left  . Elbow surgery    . Knee surgery      lt  . Carpal tunnel release      rt  . Varicose vein surgery    . Nasal septum surgery    . Abdominal hysterectomy    . Cholecystectomy    . Appendectomy    . Tonsillectomy and adenoidectomy    . Breast lumpectomy  1985    lt  . Eye surgery      both cataracts  . Wrist arthroscopy Right 09/20/2013    Procedure: RIGHT ARTHROSCOPY WRIST EXCISION PISIFORM;  Surgeon: [name], MD;  Location: [facility];  Service: Orthopedics;  Laterality: Right;  . Shoulder arthroscopy with bicepstenotomy Right 05/09/2014    Procedure: SHOULDER ARTHROSCOPY WITH BICEPSTENOTOMY;  Surgeon: [name], MD;  Location: [facility];  Service: Orthopedics;  Laterality: Right;  Debridement Rotator Cuff Tear  . Shoulder acromioplasty Right 05/09/2014    Procedure: SHOULDER ACROMIOPLASTY;  Surgeon: [name], MD;  Location: [facility];  Service: Orthopedics;  Laterality: Right;  . Shoulder arthroscopy with rotator cuff repair Right 12/19/2014   Procedure: RIGHT SHOULDER ARTHROSCOPY WITH ROTATOR CUFF REPAIR,CALCIFIC DEBRIDEMENT EXTENSIVE;  Surgeon: [name], MD;  Location: [facility];  Service: Orthopedics;  Laterality: Right;  . Shoulder arthroscopy with rotator cuff repair Right 07/10/2015    Procedure: RIGHT SHOULDER ARTHROSCOPY, DEBRIDEMENT  WITH ROTATOR CUFF REPAIR;  Surgeon: [name], MD;  Location: [facility];  Service: Orthopedics;  Laterality: Right;    There were no vitals filed for this visit.      Subjective Assessment - 08/07/15 0848    Subjective "always about the same, 5-6/10 ache pain throughout the day; worse at night"    Currently in Pain? Yes   Pain Score --  5-6/10   Pain Location Shoulder   Pain Orientation Right          TODAY'S TREATMENT Manual - supine R GH grade 1 AP mobs, STM R pec, gentle PROM into ER/IR at 0-20 ABD and flexion all as tolerated L Side-lying R scapular mobs to assist with tone normalization (high tone and tenderness R UT and medial scapular mms), STM UT and medial scap mms  Vasopneumatic compression - low pressure, 40 dg, 15', R Shoulder with   IFC (80-150Hz ) to R Shoulder x 15'  PT Short Term Goals - 08/05/15 0844    PT SHORT TERM GOAL #1   Title pt able to progress to AAROM exercises by 09/09/15   Status On-going   PT SHORT TERM GOAL #2   Title R Shoulder PROM Flexion to 140 and ER to 60 at 80 ABD by 09/09/15   Status On-going           PT Long Term Goals - 08/05/15 0844    PT LONG TERM GOAL #1   Title independent with advanced HEP as necessary by 10/07/15   PT LONG TERM GOAL #2   Title R shoulder AROM WFL and MMT 4/5 or better all planes by 10/07/15   Status On-going   PT LONG TERM GOAL #3   Title pt able to perform all ADLs and chores without limitation by R shoulder pain or weakness by 10/07/15   Status On-going               Plan - 08/07/15 0923    Clinical Impression Statement Pt tolerating  treatments well but denies noting improvement in pain since her surgery 4 wks ago.  States still having difficulty sleeping and AVG pain 5-6/10 throughout the day and worsens at night.  Treatments are keeping shoulder ROM within safe tolerances and no ABD beyond 20ish degrees performed to date.  All others performed to tolerance.   PT Next Visit Plan gentle manual for PROM and pain; modalities for pain   Consulted and Agree with Plan of Care Patient      Patient will benefit from skilled therapeutic intervention in order to improve the following deficits and impairments:  Pain, Decreased strength, Decreased mobility, Decreased range of motion, Impaired UE functional use, Postural dysfunction  Visit Diagnosis: Pain in right shoulder  Stiffness of right shoulder, not elsewhere classified     Problem List Patient Active Problem List   Diagnosis Date Noted  . Preop cardiovascular exam 07/09/2015  . Chest pain 05/09/2013  . Hypertension 02/23/2012  . Hyperlipidemia 02/23/2012  . Right shoulder pain 01/25/2012  . Left wrist pain 01/16/2012  . Neck pain 12/26/2011    [name] [name] PT, OCS 08/07/2015, 9:25 AM  [facility] [address]  [address] [place], [place], [address] Phone: [phone]   Fax:  [phone]  Name: [name] [name] MRN: [id number] Date of Birth: 1945/10/18

## 2015-08-12 ENCOUNTER — Ambulatory Visit: Payer: Medicare Other | Attending: Orthopedic Surgery | Admitting: Physical Therapy

## 2015-08-12 DIAGNOSIS — M25511 Pain in right shoulder: Secondary | ICD-10-CM

## 2015-08-12 DIAGNOSIS — M25611 Stiffness of right shoulder, not elsewhere classified: Secondary | ICD-10-CM | POA: Diagnosis present

## 2015-08-12 NOTE — Therapy (Signed)
[facility] [address]  [address] [place], [place], [address] Phone: [phone]   Fax:  [phone]  Physical Therapy Treatment  Patient Details  Name: [name] [name] MRN: [id number] Date of Birth: 1945-06-29 Referring Provider: [name], MD  Encounter Date: 08/12/2015      PT End of Session - 08/12/15 1454    Visit Number 4   Number of Visits 20   Date for PT Re-Evaluation 10/07/15   PT Start Time 1453   PT Stop Time 1540   PT Time Calculation (min) 47 min      Past Medical History  Diagnosis Date  . Allergy   . Hypertension   . Hyperlipidemia   . IBS (irritable bowel syndrome)   . Arthritis   . Full dentures   . Wears glasses     readers  . PONV (postoperative nausea and vomiting)   . Asthma     Bronchial   . Headache     migraines  . Celiac disease     Past Surgical History  Procedure Laterality Date  . Shoulder surgery      left  . Elbow surgery    . Knee surgery      lt  . Carpal tunnel release      rt  . Varicose vein surgery    . Nasal septum surgery    . Abdominal hysterectomy    . Cholecystectomy    . Appendectomy    . Tonsillectomy and adenoidectomy    . Breast lumpectomy  1985    lt  . Eye surgery      both cataracts  . Wrist arthroscopy Right 09/20/2013    Procedure: RIGHT ARTHROSCOPY WRIST EXCISION PISIFORM;  Surgeon: [name], MD;  Location: [facility];  Service: Orthopedics;  Laterality: Right;  . Shoulder arthroscopy with bicepstenotomy Right 05/09/2014    Procedure: SHOULDER ARTHROSCOPY WITH BICEPSTENOTOMY;  Surgeon: [name], MD;  Location: [facility];  Service: Orthopedics;  Laterality: Right;  Debridement Rotator Cuff Tear  . Shoulder acromioplasty Right 05/09/2014    Procedure: SHOULDER ACROMIOPLASTY;  Surgeon: [name], MD;  Location: [facility];  Service: Orthopedics;  Laterality: Right;  . Shoulder  arthroscopy with rotator cuff repair Right 12/19/2014    Procedure: RIGHT SHOULDER ARTHROSCOPY WITH ROTATOR CUFF REPAIR,CALCIFIC DEBRIDEMENT EXTENSIVE;  Surgeon: [name], MD;  Location: [facility];  Service: Orthopedics;  Laterality: Right;  . Shoulder arthroscopy with rotator cuff repair Right 07/10/2015    Procedure: RIGHT SHOULDER ARTHROSCOPY, DEBRIDEMENT  WITH ROTATOR CUFF REPAIR;  Surgeon: [name], MD;  Location: [facility];  Service: Orthopedics;  Laterality: Right;    There were no vitals filed for this visit.      Subjective Assessment - 08/12/15 1454    Subjective States had rough night last night stating only around 3 hours sleep due to shoulder pain. However, also states noted several hours of pain relief following last treatment.   Currently in Pain? Yes   Pain Score --  5-6/10   Pain Location Shoulder   Pain Orientation Right           TODAY'S TREATMENT Manual - supine R GH grade 1 AP mobs, STM R pec, gentle PROM into ER/IR at 0-20 ABD and flexion all as tolerated L Side-lying R scapular mobs to assist with tone normalization (high tone and tenderness R UT  and medial scapular mms), STM UT and medial scap mms  Vasopneumatic compression - low pressure, 40 dg, 15', R Shoulder with  IFC (80-150Hz ) to R Shoulder x 15'              PT Education - 08/12/15 1529    Education provided Yes   Education Details Home TENS online purchase   Person(s) Educated Patient   Methods Explanation;Demonstration   Comprehension Verbalized understanding          PT Short Term Goals - 08/05/15 0844    PT SHORT TERM GOAL #1   Title pt able to progress to AAROM exercises by 09/09/15   Status On-going   PT SHORT TERM GOAL #2   Title R Shoulder PROM Flexion to 140 and ER to 60 at 80 ABD by 09/09/15   Status On-going           PT Long Term Goals - 08/05/15 0844    PT LONG TERM GOAL #1   Title independent with advanced HEP as  necessary by 10/07/15   PT LONG TERM GOAL #2   Title R shoulder AROM WFL and MMT 4/5 or better all planes by 10/07/15   Status On-going   PT LONG TERM GOAL #3   Title pt able to perform all ADLs and chores without limitation by R shoulder pain or weakness by 10/07/15   Status On-going               Plan - 08/12/15 1529    Clinical Impression Statement [name] [name] noted several hours of pain reduction following last treatment which ended with IFC and vaso. Due to this, I showed her how to purchase home TENS unit on [email or web address].  She plans on purchasing unit and then bring it in here for instruction in its use.  PROM today best to date (still not pushing ROM, just to pt tolerance). ER to 30ish at 20 ABD and Flexion 100ish.   PT Next Visit Plan gentle manual for PROM and pain; modalities for pain   Consulted and Agree with Plan of Care Patient      Patient will benefit from skilled therapeutic intervention in order to improve the following deficits and impairments:  Pain, Decreased strength, Decreased mobility, Decreased range of motion, Impaired UE functional use, Postural dysfunction  Visit Diagnosis: Pain in right shoulder  Stiffness of right shoulder, not elsewhere classified     Problem List Patient Active Problem List   Diagnosis Date Noted  . Preop cardiovascular exam 07/09/2015  . Chest pain 05/09/2013  . Hypertension 02/23/2012  . Hyperlipidemia 02/23/2012  . Right shoulder pain 01/25/2012  . Left wrist pain 01/16/2012  . Neck pain 12/26/2011    [name] [name] PT, OCS 08/12/2015, 3:32 PM  [facility] [address]  [address] [place], [place], [address] Phone: [phone]   Fax:  [phone]  Name: [name] [name] MRN: [id number] Date of Birth: 03/28/1946

## 2015-08-14 ENCOUNTER — Ambulatory Visit: Payer: Medicare Other | Admitting: Physical Therapy

## 2015-08-19 ENCOUNTER — Ambulatory Visit: Payer: Medicare Other | Admitting: Physical Therapy

## 2015-08-19 DIAGNOSIS — M25511 Pain in right shoulder: Secondary | ICD-10-CM

## 2015-08-19 DIAGNOSIS — M25611 Stiffness of right shoulder, not elsewhere classified: Secondary | ICD-10-CM

## 2015-08-19 NOTE — Therapy (Signed)
[facility] [address]  [address] [place], [place], [address] Phone: [phone]   Fax:  [phone]  Physical Therapy Treatment  Patient Details  Name: [name] [name] MRN: [id number] Date of Birth: 05-11-1945 Referring Provider: Mckinley Jewel, MD  Encounter Date: 08/19/2015      PT End of Session - 08/19/15 1025    Visit Number 5   Number of Visits 20   Date for PT Re-Evaluation 10/07/15   PT Start Time 1024   PT Stop Time 1110   PT Time Calculation (min) 46 min      Past Medical History  Diagnosis Date  . Allergy   . Hypertension   . Hyperlipidemia   . IBS (irritable bowel syndrome)   . Arthritis   . Full dentures   . Wears glasses     readers  . PONV (postoperative nausea and vomiting)   . Asthma     Bronchial   . Headache     migraines  . Celiac disease     Past Surgical History  Procedure Laterality Date  . Shoulder surgery      left  . Elbow surgery    . Knee surgery      lt  . Carpal tunnel release      rt  . Varicose vein surgery    . Nasal septum surgery    . Abdominal hysterectomy    . Cholecystectomy    . Appendectomy    . Tonsillectomy and adenoidectomy    . Breast lumpectomy  1985    lt  . Eye surgery      both cataracts  . Wrist arthroscopy Right 09/20/2013    Procedure: RIGHT ARTHROSCOPY WRIST EXCISION PISIFORM;  Surgeon: Wyn Forster, MD;  Location: [facility];  Service: Orthopedics;  Laterality: Right;  . Shoulder arthroscopy with bicepstenotomy Right 05/09/2014    Procedure: SHOULDER ARTHROSCOPY WITH BICEPSTENOTOMY;  Surgeon: Loreta Ave, MD;  Location: [facility];  Service: Orthopedics;  Laterality: Right;  Debridement Rotator Cuff Tear  . Shoulder acromioplasty Right 05/09/2014    Procedure: SHOULDER ACROMIOPLASTY;  Surgeon: Loreta Ave, MD;  Location: [facility];  Service: Orthopedics;  Laterality: Right;  . Shoulder  arthroscopy with rotator cuff repair Right 12/19/2014    Procedure: RIGHT SHOULDER ARTHROSCOPY WITH ROTATOR CUFF REPAIR,CALCIFIC DEBRIDEMENT EXTENSIVE;  Surgeon: Loreta Ave, MD;  Location: [facility];  Service: Orthopedics;  Laterality: Right;  . Shoulder arthroscopy with rotator cuff repair Right 07/10/2015    Procedure: RIGHT SHOULDER ARTHROSCOPY, DEBRIDEMENT  WITH ROTATOR CUFF REPAIR;  Surgeon: Loreta Ave, MD;  [facility];  Service: Orthopedics;  Laterality: Right;    There were no vitals filed for this visit.      Subjective Assessment - 08/19/15 1025    Subjective States shoulder pain was pretty good over the weekend until Sunday and into Monday. States pain up to 8/10 at times for unknown reason. Ice decreases pain.  No change in sleep stating sleeps 3-4 hours at a time.  Is wearing sling as directed. States has not looked into home TENS as discussed last treatment but states this is on her to-do list.   Currently in Pain? Yes   Pain Score 5    Pain Location Shoulder   Pain Orientation Right          TODAY'S TREATMENT Manual - supine R GH grade  1 AP mobs, STM R pec, gentle PROM into ER/IR at 0-20 ABD and flexion all as tolerated STM and stretching to R prox biceps L Side-lying R scapular mobs to assist with pain and decreased guarding  Vasopneumatic compression - low pressure, 40 dg, 15', R Shoulder with  IFC (80-150Hz ) to R Shoulder x 15'                          PT Short Term Goals - 08/05/15 0844    PT SHORT TERM GOAL #1   Title pt able to progress to AAROM exercises by 09/09/15   Status On-going   PT SHORT TERM GOAL #2   Title R Shoulder PROM Flexion to 140 and ER to 60 at 80 ABD by 09/09/15   Status On-going           PT Long Term Goals - 08/05/15 0844    PT LONG TERM GOAL #1   Title independent with advanced HEP as necessary by 10/07/15   PT LONG TERM GOAL #2   Title R shoulder AROM WFL and  MMT 4/5 or better all planes by 10/07/15   Status On-going   PT LONG TERM GOAL #3   Title pt able to perform all ADLs and chores without limitation by R shoulder pain or weakness by 10/07/15   Status On-going               Plan - 08/19/15 1027    Clinical Impression Statement area of chief pain continues to be anterior shoulder. Area of prox biceps tendon very TTP and performed some light STM and stretching to this area today.  Pt is 6 wks s/p R RC revision and PT POC to date has been focusing on maintaining and light progressions of R GH mobility as tolerated we are avoiding straining RC tissues and avoiding pain with manual in order to limit likelihood of re-injury to RC tendon.  Pt not noting improvement in symptoms/pain since surgery. She has f/u with MD next week.   PT Next Visit Plan gentle manual for PROM and pain; modalities for pain   Consulted and Agree with Plan of Care Patient      Patient will benefit from skilled therapeutic intervention in order to improve the following deficits and impairments:  Pain, Decreased strength, Decreased mobility, Decreased range of motion, Impaired UE functional use, Postural dysfunction  Visit Diagnosis: Pain in right shoulder  Stiffness of right shoulder, not elsewhere classified     Problem List Patient Active Problem List   Diagnosis Date Noted  . Preop cardiovascular exam 07/09/2015  . Chest pain 05/09/2013  . Hypertension 02/23/2012  . Hyperlipidemia 02/23/2012  . Right shoulder pain 01/25/2012  . Left wrist pain 01/16/2012  . Neck pain 12/26/2011    Juel Bellerose PT, OCS 08/19/2015, 11:04 AM  [facility] [address]  [address] [place], [place], [address] Phone: [phone]   Fax:  [phone]  Name: Wellington HampshireLynn C Mcniel MRN: [id number] Date of Birth: 1946/01/20

## 2015-08-22 ENCOUNTER — Ambulatory Visit: Payer: Medicare Other | Admitting: Physical Therapy

## 2015-08-22 DIAGNOSIS — M25511 Pain in right shoulder: Secondary | ICD-10-CM

## 2015-08-22 DIAGNOSIS — M25611 Stiffness of right shoulder, not elsewhere classified: Secondary | ICD-10-CM

## 2015-08-22 NOTE — Therapy (Signed)
[facility] [address]  [address] [place], [place], [address] Phone: [phone]   Fax:  [phone]  Physical Therapy Treatment  Patient Details  Name: [name] [name] MRN: [id number] Date of Birth: 04/11/1946 Referring Provider: [name], MD  Encounter Date: 08/22/2015      PT End of Session - 08/22/15 1116    Visit Number 6   Number of Visits 20   Date for PT Re-Evaluation 10/07/15   PT Start Time 1025   PT Stop Time 1114   PT Time Calculation (min) 49 min      Past Medical History  Diagnosis Date  . Allergy   . Hypertension   . Hyperlipidemia   . IBS (irritable bowel syndrome)   . Arthritis   . Full dentures   . Wears glasses     readers  . PONV (postoperative nausea and vomiting)   . Asthma     Bronchial   . Headache     migraines  . Celiac disease     Past Surgical History  Procedure Laterality Date  . Shoulder surgery      left  . Elbow surgery    . Knee surgery      lt  . Carpal tunnel release      rt  . Varicose vein surgery    . Nasal septum surgery    . Abdominal hysterectomy    . Cholecystectomy    . Appendectomy    . Tonsillectomy and adenoidectomy    . Breast lumpectomy  1985    lt  . Eye surgery      both cataracts  . Wrist arthroscopy Right 09/20/2013    Procedure: RIGHT ARTHROSCOPY WRIST EXCISION PISIFORM;  Surgeon: [name], MD;  Location: [facility];  Service: Orthopedics;  Laterality: Right;  . Shoulder arthroscopy with bicepstenotomy Right 05/09/2014    Procedure: SHOULDER ARTHROSCOPY WITH BICEPSTENOTOMY;  Surgeon: [name], MD;  Location: [facility];  Service: Orthopedics;  Laterality: Right;  Debridement Rotator Cuff Tear  . Shoulder acromioplasty Right 05/09/2014    Procedure: SHOULDER ACROMIOPLASTY;  Surgeon: [name], MD;  Location: [facility];  Service: Orthopedics;  Laterality: Right;  . Shoulder  arthroscopy with rotator cuff repair Right 12/19/2014    Procedure: RIGHT SHOULDER ARTHROSCOPY WITH ROTATOR CUFF REPAIR,CALCIFIC DEBRIDEMENT EXTENSIVE;  Surgeon: [name], MD;  Location: [facility];  Service: Orthopedics;  Laterality: Right;  . Shoulder arthroscopy with rotator cuff repair Right 07/10/2015    Procedure: RIGHT SHOULDER ARTHROSCOPY, DEBRIDEMENT  WITH ROTATOR CUFF REPAIR;  Surgeon: [name], MD;  Location: [facility];  Service: Orthopedics;  Laterality: Right;    There were no vitals filed for this visit.      Subjective Assessment - 08/22/15 1117    Subjective [name] denies much change in shoulder pain since her surgery.  She states pain was intense last night (8/10) for unknown reason but feels as though was due to changing weather.  She has ordered a home TENS unit to assist with her pain control due to benefit with e-stim in clinic.   Currently in Pain? Yes   Pain Score 6    Pain Location Shoulder   Pain Orientation Right            [facility] PT Assessment - 08/22/15 0001    PROM   Right Shoulder Flexion 90 Degrees  Right Shoulder External Rotation --  35 in 30 ABD         TODAY'S TREATMENT Manual - supine R GH grade 1 AP mobs, STM R pec, gentle PROM into ER/IR at 0-30 ABD and flexion all as tolerated STM and stretching to R prox biceps L Side-lying R scapular mobs and R Shoulder mobs into ER and ABD to tolerance  Vasopneumatic compression - low pressure, 40 dg, 15', R Shoulder with  IFC (80-150Hz ) to R Shoulder x 15'             PT Short Term Goals - 08/22/15 1133    PT SHORT TERM GOAL #1   Title pt able to progress to AAROM exercises by 09/09/15   Status On-going   PT SHORT TERM GOAL #2   Title R Shoulder PROM Flexion to 140 and ER to 60 at 80 ABD by 09/09/15   Status On-going           PT Long Term Goals - 08/22/15 1133    PT LONG TERM GOAL #1   Title independent with advanced HEP as necessary  by 10/07/15   Status On-going   PT LONG TERM GOAL #2   Title R shoulder AROM WFL and MMT 4/5 or better all planes by 10/07/15   Status On-going   PT LONG TERM GOAL #3   Title pt able to perform all ADLs and chores without limitation by R shoulder pain or weakness by 10/07/15   Status On-going   PT LONG TERM GOAL #4   Title --   Status --   PT LONG TERM GOAL #5   Title --   Status --               Plan - 08/22/15 1119    Clinical Impression Statement [name] states R shoulder pain hasn't changed much since her surgery.  She states her pain is typically 5-6/10 and gets up to 8/10 which is typically due to changes in weather.  She continues to have difficulty with sleeping due to her pain stating only sleeps 3-4 hours at a time.  She has been very compliant with shoulder precautions since her surgery and has been wearing her sling nearly 24/7 (only removes for bathing and every now and then to allow arm to hang at side).  PT POC has consisted of shoulder PROM to tolerance (no ABDuction) along with gentle joint mobs and scapular mobs to assist with decreased guarding and pain.  We have not performed any exercises or motions that require activation of or strain into supraspinatus in order to limit likelihood of re-injury.  She notes some benefit with use of e-stim for pain reduction so she has ordered a home TENS unit and she should be receiving this soon (I will instruct her in safe use once it arrives).  Whereas her treatments have not focused on pushing her ROM, her R shoulder PROM has improved.  Currently, ER is to 35 degrees in 30 degrees of ABDuction and Flexion is to 90 degrees.  Both are stopped due to onset of pain, no resitance noted other than guarding.  On my end, I have no way of knowing status/integrity of her RC repair at this point.  It has been 6 weeks since her surgery so initiation of gentle AAROM at this time would be appropriate if MD agrees.  She has f/u with MD next week and  I am sending today's note to MD and expect that  he will communicate his thoughts and plans to [name].   PT Next Visit Plan progress to light AAROM if MD agrees.   Consulted and Agree with Plan of Care Patient      Patient will benefit from skilled therapeutic intervention in order to improve the following deficits and impairments:  Pain, Decreased strength, Decreased mobility, Decreased range of motion, Impaired UE functional use, Postural dysfunction  Visit Diagnosis: Pain in right shoulder  Stiffness of right shoulder, not elsewhere classified     Problem List Patient Active Problem List   Diagnosis Date Noted  . Preop cardiovascular exam 07/09/2015  . Chest pain 05/09/2013  . Hypertension 02/23/2012  . Hyperlipidemia 02/23/2012  . Right shoulder pain 01/25/2012  . Left wrist pain 01/16/2012  . Neck pain 12/26/2011    Zaxton Angerer PT, OCS 08/22/2015, 11:40 AM  [facility] [address]  [address] [place], [place], [address] Phone: [phone]   Fax:  [phone]  Name: [name] [name] MRN: [id number] Date of Birth: 09/08/1945

## 2015-08-26 ENCOUNTER — Ambulatory Visit: Payer: Medicare Other | Admitting: Physical Therapy

## 2015-08-29 ENCOUNTER — Ambulatory Visit: Payer: Medicare Other

## 2015-08-29 DIAGNOSIS — M25511 Pain in right shoulder: Secondary | ICD-10-CM | POA: Diagnosis not present

## 2015-08-29 DIAGNOSIS — M25611 Stiffness of right shoulder, not elsewhere classified: Secondary | ICD-10-CM

## 2015-08-29 NOTE — Therapy (Signed)
[facility] [address]  [address] [place], [place], [address] Phone: [phone]   Fax:  [phone]  Physical Therapy Treatment  Patient Details  Name: [name] [name] MRN: [id number] Date of Birth: 26-Feb-1946 Referring Provider: [name], MD  Encounter Date: 08/29/2015      PT End of Session - 08/29/15 0847    Visit Number 7   Number of Visits 20   Date for PT Re-Evaluation 10/07/15   PT Start Time 0804   PT Stop Time 0859   PT Time Calculation (min) 55 min   Activity Tolerance Patient tolerated treatment well;Patient limited by pain   Behavior During Therapy [facility] for tasks assessed/performed      Past Medical History  Diagnosis Date  . Allergy   . Hypertension   . Hyperlipidemia   . IBS (irritable bowel syndrome)   . Arthritis   . Full dentures   . Wears glasses     readers  . PONV (postoperative nausea and vomiting)   . Asthma     Bronchial   . Headache     migraines  . Celiac disease     Past Surgical History  Procedure Laterality Date  . Shoulder surgery      left  . Elbow surgery    . Knee surgery      lt  . Carpal tunnel release      rt  . Varicose vein surgery    . Nasal septum surgery    . Abdominal hysterectomy    . Cholecystectomy    . Appendectomy    . Tonsillectomy and adenoidectomy    . Breast lumpectomy  1985    lt  . Eye surgery      both cataracts  . Wrist arthroscopy Right 09/20/2013    Procedure: RIGHT ARTHROSCOPY WRIST EXCISION PISIFORM;  Surgeon: [name], MD;  Location: [facility];  Service: Orthopedics;  Laterality: Right;  . Shoulder arthroscopy with bicepstenotomy Right 05/09/2014    Procedure: SHOULDER ARTHROSCOPY WITH BICEPSTENOTOMY;  Surgeon: [name], MD;  Location: [facility];  Service: Orthopedics;  Laterality: Right;  Debridement Rotator Cuff Tear  . Shoulder acromioplasty Right 05/09/2014    Procedure: SHOULDER  ACROMIOPLASTY;  Surgeon: [name], MD;  Location: [facility];  Service: Orthopedics;  Laterality: Right;  . Shoulder arthroscopy with rotator cuff repair Right 12/19/2014    Procedure: RIGHT SHOULDER ARTHROSCOPY WITH ROTATOR CUFF REPAIR,CALCIFIC DEBRIDEMENT EXTENSIVE;  Surgeon: [name], MD;  Location: [facility];  Service: Orthopedics;  Laterality: Right;  . Shoulder arthroscopy with rotator cuff repair Right 07/10/2015    Procedure: RIGHT SHOULDER ARTHROSCOPY, DEBRIDEMENT  WITH ROTATOR CUFF REPAIR;  Surgeon: [name], MD;  Location: [facility];  Service: Orthopedics;  Laterality: Right;    There were no vitals filed for this visit.      Subjective Assessment - 08/29/15 0845    Subjective Pt. reports Md F/u on Tuesday the 16th of May went well and that MD wants to initiate gentile AAROM at shoulder; pt. reports new orders set to PT and brought in paper copy of new orders.     Currently in Pain? Yes   Pain Score 6    Pain Location Shoulder   Pain Orientation Right   Pain Descriptors / Indicators Aching;Dull;Nagging   Pain Type Acute pain   Pain Onset 1 to 4 weeks ago  Pain Frequency Constant   Multiple Pain Sites No      TODAY'S TREATMENT Manual : Supine R GH grade 1 AP mobs STM R UT TPR to Mid trap (medial/superior) scap border  Gentle PROM into ER/IR at 0-30 ABD and flexion all as tolerated Gentle AAROM into ER/IR at 0-30 ABD and flexion; pt. With 7/10 pain reported at end range each direction; self-resolved quickly after this activity was terminated.           * highly conservative approach taken with this  STM and stretching to R prox biceps L Side-lying R scapular mobs all directions  Vasopneumatic compression - low pressure, 40 dg, 15', R Shoulder with  IFC (80-150Hz ) to R Shoulder x 15'          PT Short Term Goals - 08/22/15 1133    PT SHORT TERM GOAL #1   Title pt able to progress to AAROM  exercises by 09/09/15   Status On-going   PT SHORT TERM GOAL #2   Title R Shoulder PROM Flexion to 140 and ER to 60 at 80 ABD by 09/09/15   Status On-going           PT Long Term Goals - 08/22/15 1133    PT LONG TERM GOAL #1   Title independent with advanced HEP as necessary by 10/07/15   Status On-going   PT LONG TERM GOAL #2   Title R shoulder AROM WFL and MMT 4/5 or better all planes by 10/07/15   Status On-going   PT LONG TERM GOAL #3   Title pt able to perform all ADLs and chores without limitation by R shoulder pain or weakness by 10/07/15   Status On-going   PT LONG TERM GOAL #4   Title --   Status --   PT LONG TERM GOAL #5   Title --   Status --               Plan - 08/29/15 0848    Clinical Impression Statement Pt. seen initially today with 6/10 R shoulder pain.  Pt. appointment with MD on the 16th went well with MD clearing her for gentle/light AAROM.  Gentle R shoulder therapist assisted AAROM IR/ER initiated in supine supported 30 dg flexion/abduction today; painful arc avoided with this gentle AAROM movement.  Duration of treatment today focused on gentle PROM to R shoulder and R scapular STM to medial scapular border and R UT.  Ice / E-stim . applied to R shoulder following today's Treatment.     PT Treatment/Interventions Manual techniques;Vasopneumatic Device;Taping;Passive range of motion;Patient/family education;Therapeutic exercise;Therapeutic activities;Cryotherapy;Electrical Stimulation;Moist Heat;Ultrasound   PT Next Visit Plan Progress to light AAROM; MD has now agreed to light/gentile AAROM per order; pt. allowed to drive       Patient will benefit from skilled therapeutic intervention in order to improve the following deficits and impairments:  Pain, Decreased strength, Decreased mobility, Decreased range of motion, Impaired UE functional use, Postural dysfunction  Visit Diagnosis: Pain in right shoulder  Stiffness of right shoulder, not elsewhere  classified     Problem List Patient Active Problem List   Diagnosis Date Noted  . Preop cardiovascular exam 07/09/2015  . Chest pain 05/09/2013  . Hypertension 02/23/2012  . Hyperlipidemia 02/23/2012  . Right shoulder pain 01/25/2012  . Left wrist pain 01/16/2012  . Neck pain 12/26/2011    Kermit Balo, PTA 08/29/2015, 12:18 PM  [facility] [address]  [address] [place], [place], [address] Phone: [phone]   Fax:  [phone]  Name: [name] [name] MRN: [id number] Date of Birth: [birth year]

## 2015-09-02 ENCOUNTER — Ambulatory Visit: Payer: Medicare Other | Admitting: Physical Therapy

## 2015-09-02 DIAGNOSIS — M25511 Pain in right shoulder: Secondary | ICD-10-CM

## 2015-09-02 DIAGNOSIS — M25611 Stiffness of right shoulder, not elsewhere classified: Secondary | ICD-10-CM

## 2015-09-02 NOTE — Therapy (Signed)
[facility] [address]  [address] [place], [place], [address] Phone: [phone]   Fax:  [phone]  Physical Therapy Treatment  Patient Details  Name: [name] [name] MRN: [id number] Date of Birth: [birth year] Referring Provider: Mckinley Jewel, MD  Encounter Date: 09/02/2015      PT End of Session - 09/02/15 1022    Visit Number 8   Number of Visits 20   Date for PT Re-Evaluation 10/07/15   PT Start Time 1021   PT Stop Time 1110   PT Time Calculation (min) 49 min      Past Medical History  Diagnosis Date  . Allergy   . Hypertension   . Hyperlipidemia   . IBS (irritable bowel syndrome)   . Arthritis   . Full dentures   . Wears glasses     readers  . PONV (postoperative nausea and vomiting)   . Asthma     Bronchial   . Headache     migraines  . Celiac disease     Past Surgical History  Procedure Laterality Date  . Shoulder surgery      left  . Elbow surgery    . Knee surgery      lt  . Carpal tunnel release      rt  . Varicose vein surgery    . Nasal septum surgery    . Abdominal hysterectomy    . Cholecystectomy    . Appendectomy    . Tonsillectomy and adenoidectomy    . Breast lumpectomy  1985    lt  . Eye surgery      both cataracts  . Wrist arthroscopy Right 09/20/2013    Procedure: RIGHT ARTHROSCOPY WRIST EXCISION PISIFORM;  Surgeon: [name] Forster, MD;  Location: [facility];  Service: Orthopedics;  Laterality: Right;  . Shoulder arthroscopy with bicepstenotomy Right 05/09/2014    Procedure: SHOULDER ARTHROSCOPY WITH BICEPSTENOTOMY;  Surgeon: [name] Ave, MD;  Location: [facility];  Service: Orthopedics;  Laterality: Right;  Debridement Rotator Cuff Tear  . Shoulder acromioplasty Right 05/09/2014    Procedure: SHOULDER ACROMIOPLASTY;  Surgeon: [name] Ave, MD;  Location: [facility];  Service: Orthopedics;  Laterality: Right;  . Shoulder  arthroscopy with rotator cuff repair Right 12/19/2014    Procedure: RIGHT SHOULDER ARTHROSCOPY WITH ROTATOR CUFF REPAIR,CALCIFIC DEBRIDEMENT EXTENSIVE;  Surgeon: [name] Ave, MD;  Location: [facility];  Service: Orthopedics;  Laterality: Right;  . Shoulder arthroscopy with rotator cuff repair Right 07/10/2015    Procedure: RIGHT SHOULDER ARTHROSCOPY, DEBRIDEMENT  WITH ROTATOR CUFF REPAIR;  Surgeon: [name] Ave, MD;  Location: [facility];  Service: Orthopedics;  Laterality: Right;    There were no vitals filed for this visit.      Subjective Assessment - 09/02/15 1025    Subjective MD has advised that we can progress [name] [name] per [facility] protocol as able.  Currently, no change in shoulder pain and still limited to 3-4 hours sleep/night.  No longer wearing sling.   Currently in Pain? Yes   Pain Score 5    Pain Location Shoulder   Pain Orientation Right            [facility] PT Assessment - 09/02/15 0001    Assessment   Next MD Visit 10/07/15   PROM   Right Shoulder External Rotation 60 Degrees  in 30 ABD  TODAY'S TREATMENT Manual - R GH grade 2 AP glides with mobs into ER, IR, and Flexion to tolerance  TherEx - Supine B Scap retraction and shoulder extension isometric 10x3" Supine SPC Chest Press AAROM 2x10 Supine SPC R ER AAROM 10x Supine SPC Pullover AAROM 10x L Side-Lying R GH ER AROM 20x L Side-Lying R hand on [place] shoulder Flexion rollout AROM 15x Supine R GH CW/CCW 10x each in 90 ABD Seated hand on table shoulder flexion slide 10x Seated hand on table shoulder ER/IR ROM 10x  Vasopneumatic compression - low pressure, 38dg, 15' R shoulder with   IFC (80-150Hz ) to R Shoulder 15'             PT Short Term Goals - 08/22/15 1133    PT SHORT TERM GOAL #1   Title pt able to progress to AAROM exercises by 09/09/15   Status On-going   PT SHORT TERM GOAL #2   Title R Shoulder PROM Flexion to 140 and ER to 60 at 80 ABD  by 09/09/15   Status On-going           PT Long Term Goals - 08/22/15 1133    PT LONG TERM GOAL #1   Title independent with advanced HEP as necessary by 10/07/15   Status On-going   PT LONG TERM GOAL #2   Title R shoulder AROM WFL and MMT 4/5 or better all planes by 10/07/15   Status On-going   PT LONG TERM GOAL #3   Title pt able to perform all ADLs and chores without limitation by R shoulder pain or weakness by 10/07/15   Status On-going   PT LONG TERM GOAL #4   Title --   Status --   PT LONG TERM GOAL #5   Title --   Status --               Plan - 09/02/15 1045    Clinical Impression Statement Initiated some AAROM activities to R Shoulder today.  Most activities performed between 0-90 Flexion other than one set of pullovers which were to pt tolerance into flexion.  She was able to achieve approx 120 degrees but pain noted with every rep.  ER PROM with good improvement today.  We will continue to progress at a cautious rate with AAROM and AROM over the next few weeks.   PT Next Visit Plan Progress AAROM as tolerated; manual for pain and ROM; modalities for pain PRN; instruct in Home TENS use once her unit arrives.   Consulted and Agree with Plan of Care Patient      Patient will benefit from skilled therapeutic intervention in order to improve the following deficits and impairments:  Pain, Decreased strength, Decreased mobility, Decreased range of motion, Impaired UE functional use, Postural dysfunction  Visit Diagnosis: Pain in right shoulder  Stiffness of right shoulder, not elsewhere classified     Problem List Patient Active Problem List   Diagnosis Date Noted  . Preop cardiovascular exam 07/09/2015  . Chest pain 05/09/2013  . Hypertension 02/23/2012  . Hyperlipidemia 02/23/2012  . Right shoulder pain 01/25/2012  . Left wrist pain 01/16/2012  . Neck pain 12/26/2011    [name] [name] PT, OCS 09/02/2015, 11:02 AM  [facility]  [facility] [address]  [address] [place], [place], [address] Phone: [phone]   Fax:  [phone]  Name: [name] HampshireLynn C [name] MRN: [id number] Date of Birth: 04-16-1945

## 2015-09-05 ENCOUNTER — Ambulatory Visit: Payer: Medicare Other | Admitting: Physical Therapy

## 2015-09-05 DIAGNOSIS — M25511 Pain in right shoulder: Secondary | ICD-10-CM

## 2015-09-05 DIAGNOSIS — M25611 Stiffness of right shoulder, not elsewhere classified: Secondary | ICD-10-CM

## 2015-09-05 NOTE — Therapy (Signed)
[facility] [address]  [place], [place], [address] Phone: [phone]   Fax:  [phone]  Physical Therapy Treatment  Patient Details  Name: [name] [name] MRN: [id number] Date of Birth: 1945-11-29 Referring Provider: Kathryne Hitch, MD  Encounter Date: 09/05/2015      PT End of Session - 09/05/15 1013    Visit Number 9   Number of Visits 20   Date for PT Re-Evaluation 10/07/15   PT Start Time 1013   PT Stop Time 1115   PT Time Calculation (min) 62 min           Subjective Assessment - 09/05/15 1015    Subjective States noted soreness day following last treatment which she state was up to 7/10 but has since eased off and down to 4/10 currently.  No improvement in sleep.  Still limted to no greater than 4 hours at a time.   Currently in Pain? Yes   Pain Score 4    Pain Location Shoulder   Pain Orientation Right            [facility] PT Assessment - 09/05/15 0001    Observation/Other Assessments   Focus on Therapeutic Outcomes (FOTO)  65% limitation          TODAY'S TREATMENT TherEx - Pulleys: Flexion 15x, Scaption 15x  Manual - R GH grade 2 AP glides with mobs into ER, IR, and Flexion to tolerance; TPR/STM R lateral delt (small area of high density and pain noted)  TherEx - Supine B Scap retraction and shoulder extension isometric 10x3" Supine SPC Chest Press AAROM 12x Supine SPC R ER AAROM 12x Supine SPC Horiz ABD/ADD 10x (poor tolerance) L Side-Lying R GH ER AROM 20x Standing R GH Ext Isometric 10x5" Standing R GH IR Isometric 10x5" Standing R hand on table GH CW/CCW 20x each Seated hand on table shoulder flexion slide 10x  Vasopneumatic compression - low pressure, 38dg, 15' R shoulder with  IFC (80-_0 ) to R Shoulder 15'                     PT Short Term Goals - 09/05/15 1109    PT SHORT TERM GOAL #1   Title pt able to progress to AAROM exercises by 09/09/15    Status Achieved   PT SHORT TERM GOAL #2   Title R Shoulder PROM Flexion to 140 and ER to 60 at 80 ABD by 09/09/15  ER goal met at 45ish ABD   Status Partially Met           PT Long Term Goals - 09/05/15 1110    PT LONG TERM GOAL #1   Title independent with advanced HEP as necessary by 10/07/15   Status On-going   PT LONG TERM GOAL #2   Title R shoulder AROM WFL and MMT 4/5 or better all planes by 10/07/15   Status On-going   PT LONG TERM GOAL #3   Title pt able to perform all ADLs and chores without limitation by R shoulder pain or weakness by 10/07/15   Status On-going               Plan - 09/05/15 1104    Clinical Impression Statement [name] [name] underwent R RCR revision on 07/10/15 due to re-tear of supraspin following RCR in Sept 2016.  Due to being re-injury and poor quality tissue noted at surgery, she was limited to PROM only for 1st  6 weeks.  She has progressed to light AAROM and AROM recently and HEP updated today.  She continues to note pain in shoulder which is typically noted in anterior shoulder but her reported pain values have iimproved some (down to 4/10 today which is lowest value reported since her surgery).  She continues to have difficulty sleeping due to shoulder pain stating limited to 3-4 hours at a time typically.  Treatments are focusing on pt's available ROM with all activities and we are not concerned with pushing her ROM aggressively.  She seems to be making progress but still too early to determine success of RC surgery.   PT Next Visit Plan Progress AAROM as tolerated; manual for pain and ROM; modalities for pain PRN; instruct in Home TENS use once her unit arrives.   Consulted and Agree with Plan of Care Patient      Patient will benefit from skilled therapeutic intervention in order to improve the following deficits and impairments:  Pain, Decreased strength, Decreased mobility, Decreased range of motion, Impaired UE functional use, Postural  dysfunction  Visit Diagnosis: Pain in right shoulder  Stiffness of right shoulder, not elsewhere classified     Problem List Patient Active Problem List   Diagnosis Date Noted  . Preop cardiovascular exam 07/09/2015  . Chest pain 05/09/2013  . Hypertension 02/23/2012  . Hyperlipidemia 02/23/2012  . Right shoulder pain 01/25/2012  . Left wrist pain 01/16/2012  . Neck pain 12/26/2011    [name] [name] PT, OCS 09/05/2015, 11:20 AM  [facility] [address]  [place], [place], [address] Phone: [phone]   Fax:  [phone]  Name: [name] [name] MRN: [id number] Date of Birth: [birth year]

## 2015-09-09 ENCOUNTER — Ambulatory Visit: Payer: Medicare Other | Admitting: Physical Therapy

## 2015-09-09 DIAGNOSIS — M25511 Pain in right shoulder: Secondary | ICD-10-CM | POA: Diagnosis not present

## 2015-09-09 DIAGNOSIS — M25611 Stiffness of right shoulder, not elsewhere classified: Secondary | ICD-10-CM

## 2015-09-09 NOTE — Therapy (Signed)
[facility] [address]  [place], [place], [address] Phone: [phone]   Fax:  [phone]  Physical Therapy Treatment  Patient Details  Name: [name] [name] MRN: [id number] Date of Birth: [birth year] Referring Provider: Kathryne Hitch, MD  Encounter Date: 09/09/2015      PT End of Session - 09/09/15 1159    Visit Number 10   Number of Visits 20   Date for PT Re-Evaluation 10/07/15   PT Start Time 1030   PT Stop Time 1125   PT Time Calculation (min) 55 min   Activity Tolerance Patient tolerated treatment well;Patient limited by pain   Behavior During Therapy [facility] for tasks assessed/performed      Past Medical History  Diagnosis Date  . Allergy   . Hypertension   . Hyperlipidemia   . IBS (irritable bowel syndrome)   . Arthritis   . Full dentures   . Wears glasses     readers  . PONV (postoperative nausea and vomiting)   . Asthma     Bronchial   . Headache     migraines  . Celiac disease     Past Surgical History  Procedure Laterality Date  . Shoulder surgery      left  . Elbow surgery    . Knee surgery      lt  . Carpal tunnel release      rt  . Varicose vein surgery    . Nasal septum surgery    . Abdominal hysterectomy    . Cholecystectomy    . Appendectomy    . Tonsillectomy and adenoidectomy    . Breast lumpectomy  1985    lt  . Eye surgery      both cataracts  . Wrist arthroscopy Right 09/20/2013    Procedure: RIGHT ARTHROSCOPY WRIST EXCISION PISIFORM;  Surgeon: Cammie Sickle, MD;  Location: [place];  Service: Orthopedics;  Laterality: Right;  . Shoulder arthroscopy with bicepstenotomy Right 05/09/2014    Procedure: SHOULDER ARTHROSCOPY WITH BICEPSTENOTOMY;  Surgeon: Ninetta Lights, MD;  Location: [place];  Service: Orthopedics;  Laterality: Right;  Debridement Rotator Cuff Tear  . Shoulder acromioplasty Right 05/09/2014    Procedure: SHOULDER  ACROMIOPLASTY;  Surgeon: Ninetta Lights, MD;  Location: [place];  Service: Orthopedics;  Laterality: Right;  . Shoulder arthroscopy with rotator cuff repair Right 12/19/2014    Procedure: RIGHT SHOULDER ARTHROSCOPY WITH ROTATOR CUFF REPAIR,CALCIFIC DEBRIDEMENT EXTENSIVE;  Surgeon: Ninetta Lights, MD;  Location: [place];  Service: Orthopedics;  Laterality: Right;  . Shoulder arthroscopy with rotator cuff repair Right 07/10/2015    Procedure: RIGHT SHOULDER ARTHROSCOPY, DEBRIDEMENT  WITH ROTATOR CUFF REPAIR;  Surgeon: Ninetta Lights, MD;  Location: [place];  Service: Orthopedics;  Laterality: Right;    There were no vitals filed for this visit.      Subjective Assessment - 09/09/15 1029    Subjective still having same pain in same spot.  reports pain usually a 4-5/10 and always a dull ache   Patient Stated Goals "get back to normal"   Currently in Pain? Yes   Pain Score 5    Pain Location Shoulder   Pain Orientation Right   Pain Descriptors / Indicators Aching;Dull   Pain Type Acute pain   Pain Onset More than a month ago   Pain Frequency Constant   Aggravating Factors  pain increases  throughout the day   Pain Relieving Factors meds            [facility] PT Assessment - 07-Oct-2015 1201    Observation/Other Assessments   Focus on Therapeutic Outcomes (FOTO)  65% limitation                     [facility] Adult PT Treatment/Exercise - 10-07-2015 1031    Shoulder Exercises: Supine   External Rotation AAROM;Right;15 reps;Limitations   External Rotation Limitations with cane   Flexion AAROM;15 reps   Flexion Limitations with cane   Other Supine Exercises chest press with cane x 15   Other Supine Exercises scap retraction isometric 10x5"   Shoulder Exercises: Seated   Row Right;10 reps;Theraband   Theraband Level (Shoulder Row) Level 1 (Yellow)   Row Limitations to tolerance only   Other Seated Exercises Standing R hand on  table GH CW/CCW 20x each; cues to decrease shoulder shrug   Shoulder Exercises: Sidelying   External Rotation AROM;Right;15 reps   Shoulder Exercises: Pulleys   Flexion 3 minutes   ABduction 3 minutes   ABduction Limitations scaption   Shoulder Exercises: Isometric Strengthening   Flexion --  10x5"   Extension Supine  10x5"   Internal Rotation --  10x5"   Modalities   Modalities Vasopneumatic   Vasopneumatic   Number Minutes Vasopneumatic  15 minutes   Vasopnuematic Location  Shoulder   Vasopneumatic Pressure Low   Vasopneumatic Temperature  32   Manual Therapy   Manual Therapy Passive ROM   Passive ROM supine R shoulder flexion, ER and abdct to tolerance and within protocol limitations                  PT Short Term Goals - 09/05/15 1109    PT SHORT TERM GOAL #1   Title pt able to progress to AAROM exercises by Oct 07, 2015   Status Achieved   PT SHORT TERM GOAL #2   Title R Shoulder PROM Flexion to 140 and ER to 60 at 80 ABD by 2015/10/07  ER goal met at 45ish ABD   Status Partially Met           PT Long Term Goals - 09/05/15 1110    PT LONG TERM GOAL #1   Title independent with advanced HEP as necessary by 10/07/15   Status On-going   PT LONG TERM GOAL #2   Title R shoulder AROM WFL and MMT 4/5 or better all planes by 10/07/15   Status On-going   PT LONG TERM GOAL #3   Title pt able to perform all ADLs and chores without limitation by R shoulder pain or weakness by 10/07/15   Status On-going               Plan - 2015-10-07 1201    Clinical Impression Statement Pt improving with ROM progressing slowly and pain unchanged during session today.  FOTO score improved from 97% limited to 65% limited.   PT Next Visit Plan Progress AAROM as tolerated; manual for pain and ROM; modalities for pain PRN; instruct in Home TENS use once her unit arrives.   Consulted and Agree with Plan of Care Patient      Patient will benefit from skilled therapeutic intervention  in order to improve the following deficits and impairments:     Visit Diagnosis: Pain in right shoulder  Stiffness of right shoulder, not elsewhere classified       G-Codes - 10-07-2015 June 09, 1200  Functional Assessment Tool Used FOTO 65% limitation   Functional Limitation Carrying, moving and handling objects   Carrying, Moving and Handling Objects Current Status [phone]) At least 60 percent but less than 80 percent impaired, limited or restricted   Carrying, Moving and Handling Objects Goal Status (U0454) At least 40 percent but less than 60 percent impaired, limited or restricted      Problem List Patient Active Problem List   Diagnosis Date Noted  . Preop cardiovascular exam 07/09/2015  . Chest pain 05/09/2013  . Hypertension 02/23/2012  . Hyperlipidemia 02/23/2012  . Right shoulder pain 01/25/2012  . Left wrist pain 01/16/2012  . Neck pain 12/26/2011   Laureen Abrahams, PT, DPT 09/09/2015 12:03 PM  [facility] [address]  [place], [place], [address] Phone: [phone]   Fax:  [phone]  Name: SYLVANIA MOSS MRN: [id number] Date of Birth: 12-12-1945

## 2015-09-11 ENCOUNTER — Ambulatory Visit: Payer: Medicare Other | Attending: Orthopedic Surgery | Admitting: Physical Therapy

## 2015-09-11 DIAGNOSIS — M25511 Pain in right shoulder: Secondary | ICD-10-CM | POA: Diagnosis not present

## 2015-09-11 DIAGNOSIS — M25611 Stiffness of right shoulder, not elsewhere classified: Secondary | ICD-10-CM | POA: Insufficient documentation

## 2015-09-11 NOTE — Therapy (Signed)
[facility] [address]  [place], [place], [address] Phone: [phone]   Fax:  [phone]  Physical Therapy Treatment  Patient Details  Name: [name] [name] MRN: [id number] Date of Birth: 11/22/1945 Referring Provider: Kathryne Hitch, MD  Encounter Date: 09/11/2015      PT End of Session - 09/11/15 1156    Visit Number 11   Number of Visits 20   Date for PT Re-Evaluation 10/07/15   PT Start Time 1110   PT Stop Time 1203   PT Time Calculation (min) 53 min   Activity Tolerance Patient tolerated treatment well;Patient limited by pain   Behavior During Therapy [facility] for tasks assessed/performed      Past Medical History  Diagnosis Date  . Allergy   . Hypertension   . Hyperlipidemia   . IBS (irritable bowel syndrome)   . Arthritis   . Full dentures   . Wears glasses     readers  . PONV (postoperative nausea and vomiting)   . Asthma     Bronchial   . Headache     migraines  . Celiac disease     Past Surgical History  Procedure Laterality Date  . Shoulder surgery      left  . Elbow surgery    . Knee surgery      lt  . Carpal tunnel release      rt  . Varicose vein surgery    . Nasal septum surgery    . Abdominal hysterectomy    . Cholecystectomy    . Appendectomy    . Tonsillectomy and adenoidectomy    . Breast lumpectomy  1985    lt  . Eye surgery      both cataracts  . Wrist arthroscopy Right 09/20/2013    Procedure: RIGHT ARTHROSCOPY WRIST EXCISION PISIFORM;  Surgeon: Cammie Sickle, MD;  Location: [place];  Service: Orthopedics;  Laterality: Right;  . Shoulder arthroscopy with bicepstenotomy Right 05/09/2014    Procedure: SHOULDER ARTHROSCOPY WITH BICEPSTENOTOMY;  Surgeon: Ninetta Lights, MD;  Location: [place];  Service: Orthopedics;  Laterality: Right;  Debridement Rotator Cuff Tear  . Shoulder acromioplasty Right 05/09/2014    Procedure: SHOULDER  ACROMIOPLASTY;  Surgeon: Ninetta Lights, MD;  Location: [place];  Service: Orthopedics;  Laterality: Right;  . Shoulder arthroscopy with rotator cuff repair Right 12/19/2014    Procedure: RIGHT SHOULDER ARTHROSCOPY WITH ROTATOR CUFF REPAIR,CALCIFIC DEBRIDEMENT EXTENSIVE;  Surgeon: Ninetta Lights, MD;  Location: [place];  Service: Orthopedics;  Laterality: Right;  . Shoulder arthroscopy with rotator cuff repair Right 07/10/2015    Procedure: RIGHT SHOULDER ARTHROSCOPY, DEBRIDEMENT  WITH ROTATOR CUFF REPAIR;  Surgeon: Ninetta Lights, MD;  Location: [place];  Service: Orthopedics;  Laterality: Right;    There were no vitals filed for this visit.      Subjective Assessment - 09/11/15 1113    Subjective "Yesterday was miserable.  I think it was the weather."  Feeling better today   Patient Stated Goals "get back to normal"   Currently in Pain? Yes   Pain Score 5    Pain Location Shoulder   Pain Orientation Right   Pain Descriptors / Indicators Aching;Dull   Pain Type Acute pain   Pain Radiating Towards R shoulder and extends to bil scap area throughout the day   Pain Onset More than a month ago  Pain Frequency Constant   Aggravating Factors  pain increases throughout the day   Pain Relieving Factors meds                         [facility] Adult PT Treatment/Exercise - 09/11/15 1115    Shoulder Exercises: Supine   External Rotation AAROM;Right;Limitations;20 reps   External Rotation Limitations with cane   Flexion AAROM;20 reps   Flexion Limitations with cane   Other Supine Exercises chest press with cane x 20   Other Supine Exercises scap retraction isometric 10x5"; isometric extension 10x5"   Shoulder Exercises: Pulleys   Flexion 3 minutes   ABduction 3 minutes   ABduction Limitations scaption   Vasopneumatic   Number Minutes Vasopneumatic  15 minutes   Vasopnuematic Location  Shoulder   Vasopneumatic Pressure  Low   Vasopneumatic Temperature  32   Manual Therapy   Manual Therapy Passive ROM;Joint mobilization   Joint Mobilization grades 1/2 A/P L shoulder   Passive ROM supine R shoulder flexion, ER and abdct to tolerance and within protocol limitations                  PT Short Term Goals - 09/05/15 1109    PT SHORT TERM GOAL #1   Title pt able to progress to AAROM exercises by 09/09/15   Status Achieved   PT SHORT TERM GOAL #2   Title R Shoulder PROM Flexion to 140 and ER to 60 at 80 ABD by 09/09/15  ER goal met at 45ish ABD   Status Partially Met           PT Long Term Goals - 09/05/15 1110    PT LONG TERM GOAL #1   Title independent with advanced HEP as necessary by 10/07/15   Status On-going   PT LONG TERM GOAL #2   Title R shoulder AROM WFL and MMT 4/5 or better all planes by 10/07/15   Status On-going   PT LONG TERM GOAL #3   Title pt able to perform all ADLs and chores without limitation by R shoulder pain or weakness by 10/07/15   Status On-going               Plan - 09/11/15 1156    Clinical Impression Statement Pt tolerated session well; continues to make progress within RTC protocol.  Still has pain with ROM.   PT Next Visit Plan Progress AAROM as tolerated; manual for pain and ROM; modalities for pain PRN; instruct in Home TENS use once her unit arrives.   Consulted and Agree with Plan of Care Patient      Patient will benefit from skilled therapeutic intervention in order to improve the following deficits and impairments:     Visit Diagnosis: Pain in right shoulder  Stiffness of right shoulder, not elsewhere classified     Problem List Patient Active Problem List   Diagnosis Date Noted  . Preop cardiovascular exam 07/09/2015  . Chest pain 05/09/2013  . Hypertension 02/23/2012  . Hyperlipidemia 02/23/2012  . Right shoulder pain 01/25/2012  . Left wrist pain 01/16/2012  . Neck pain 12/26/2011   Laureen Abrahams, PT, DPT 09/11/2015  12:07 PM  [facility] [address]  [place], [place], [address] Phone: [phone]   Fax:  [phone]  Name: DEZIREA MCCOLLISTER MRN: [id number] Date of Birth: 19-May-1945

## 2015-09-12 ENCOUNTER — Ambulatory Visit: Payer: Medicare Other

## 2015-09-15 ENCOUNTER — Ambulatory Visit: Payer: Medicare Other | Admitting: Physical Therapy

## 2015-09-15 DIAGNOSIS — M25511 Pain in right shoulder: Secondary | ICD-10-CM | POA: Diagnosis not present

## 2015-09-15 DIAGNOSIS — M25611 Stiffness of right shoulder, not elsewhere classified: Secondary | ICD-10-CM

## 2015-09-15 NOTE — Therapy (Signed)
[facility] [address]  [place], [place], [address] Phone: [phone]   Fax:  [phone]  Physical Therapy Treatment  Patient Details  Name: [name] [name] MRN: [id number] Date of Birth: 1945/10/14 Referring Provider: Kathryne Hitch, MD  Encounter Date: 09/15/2015      PT End of Session - 09/15/15 1234    Visit Number 12   Number of Visits 20   Date for PT Re-Evaluation 10/07/15   PT Start Time 1030   PT Stop Time 1123   PT Time Calculation (min) 53 min   Activity Tolerance Patient tolerated treatment well   Behavior During Therapy [facility] for tasks assessed/performed      Past Medical History  Diagnosis Date  . Allergy   . Hypertension   . Hyperlipidemia   . IBS (irritable bowel syndrome)   . Arthritis   . Full dentures   . Wears glasses     readers  . PONV (postoperative nausea and vomiting)   . Asthma     Bronchial   . Headache     migraines  . Celiac disease     Past Surgical History  Procedure Laterality Date  . Shoulder surgery      left  . Elbow surgery    . Knee surgery      lt  . Carpal tunnel release      rt  . Varicose vein surgery    . Nasal septum surgery    . Abdominal hysterectomy    . Cholecystectomy    . Appendectomy    . Tonsillectomy and adenoidectomy    . Breast lumpectomy  1985    lt  . Eye surgery      both cataracts  . Wrist arthroscopy Right 09/20/2013    Procedure: RIGHT ARTHROSCOPY WRIST EXCISION PISIFORM;  Surgeon: Cammie Sickle, MD;  Location: [place];  Service: Orthopedics;  Laterality: Right;  . Shoulder arthroscopy with bicepstenotomy Right 05/09/2014    Procedure: SHOULDER ARTHROSCOPY WITH BICEPSTENOTOMY;  Surgeon: Ninetta Lights, MD;  Location: [place];  Service: Orthopedics;  Laterality: Right;  Debridement Rotator Cuff Tear  . Shoulder acromioplasty Right 05/09/2014    Procedure: SHOULDER ACROMIOPLASTY;  Surgeon:  Ninetta Lights, MD;  Location: [place];  Service: Orthopedics;  Laterality: Right;  . Shoulder arthroscopy with rotator cuff repair Right 12/19/2014    Procedure: RIGHT SHOULDER ARTHROSCOPY WITH ROTATOR CUFF REPAIR,CALCIFIC DEBRIDEMENT EXTENSIVE;  Surgeon: Ninetta Lights, MD;  Location: [place];  Service: Orthopedics;  Laterality: Right;  . Shoulder arthroscopy with rotator cuff repair Right 07/10/2015    Procedure: RIGHT SHOULDER ARTHROSCOPY, DEBRIDEMENT  WITH ROTATOR CUFF REPAIR;  Surgeon: Ninetta Lights, MD;  Location: [place];  Service: Orthopedics;  Laterality: Right;    There were no vitals filed for this visit.      Subjective Assessment - 09/15/15 1031    Subjective Shoulder is miserable; "it's the weather."   Diagnostic tests MRI yesterday indicates C6 nerve root impingement   Patient Stated Goals "get back to normal"   Currently in Pain? Yes   Pain Score 5   "5.5"   Pain Location Shoulder   Pain Orientation Right   Pain Descriptors / Indicators Aching;Dull   Pain Type Surgical pain   Pain Onset More than a month ago   Pain Frequency Constant   Aggravating Factors  pain increases throughout the  day   Pain Relieving Factors medication                         [facility] Adult PT Treatment/Exercise - 09/15/15 1033    Shoulder Exercises: Standing   Retraction Both;15 reps   Shoulder Elevation Both;15 reps;Standing   Shoulder Exercises: Pulleys   Flexion 3 minutes   ABduction 3 minutes   ABduction Limitations scaption   Shoulder Exercises: Isometric Strengthening   Extension Supine  10x5"   Extension Limitations supine and standing   Internal Rotation Limitations   Internal Rotation Limitations standing 10x5"   Vasopneumatic   Number Minutes Vasopneumatic  15 minutes   Vasopnuematic Location  Shoulder   Vasopneumatic Pressure Low   Vasopneumatic Temperature  32   Manual Therapy   Manual Therapy  Passive ROM   Passive ROM supine R shoulder flexion, ER and abdct to tolerance and within protocol limitations     Vaso stopped ~ 4 min early due to pressure error.             PT Short Term Goals - 09/05/15 1109    PT SHORT TERM GOAL #1   Title pt able to progress to AAROM exercises by 09/09/15   Status Achieved   PT SHORT TERM GOAL #2   Title R Shoulder PROM Flexion to 140 and ER to 60 at 80 ABD by 09/09/15  ER goal met at 45ish ABD   Status Partially Met           PT Long Term Goals - 09/05/15 1110    PT LONG TERM GOAL #1   Title independent with advanced HEP as necessary by 10/07/15   Status On-going   PT LONG TERM GOAL #2   Title R shoulder AROM WFL and MMT 4/5 or better all planes by 10/07/15   Status On-going   PT LONG TERM GOAL #3   Title pt able to perform all ADLs and chores without limitation by R shoulder pain or weakness by 10/07/15   Status On-going               Plan - 09/15/15 1235    Clinical Impression Statement Pt tolerated session well; continues to have pain with end range flexion and abduction within protocol limitations.  Slowly progressing as protocol allows.   PT Next Visit Plan Progress AAROM as tolerated; manual for pain and ROM; modalities for pain PRN; instruct in Home TENS use once her unit arrives.   Consulted and Agree with Plan of Care Patient      Patient will benefit from skilled therapeutic intervention in order to improve the following deficits and impairments:     Visit Diagnosis: Pain in right shoulder  Stiffness of right shoulder, not elsewhere classified     Problem List Patient Active Problem List   Diagnosis Date Noted  . Preop cardiovascular exam 07/09/2015  . Chest pain 05/09/2013  . Hypertension 02/23/2012  . Hyperlipidemia 02/23/2012  . Right shoulder pain 01/25/2012  . Left wrist pain 01/16/2012  . Neck pain 12/26/2011   Laureen Abrahams, PT, DPT 09/15/2015 12:38 PM  [facility] [address]  [place], [place], [address] Phone: [phone]   Fax:  [phone]  Name: [name] [name] MRN: [id number] Date of Birth: [birth year]

## 2015-09-18 ENCOUNTER — Ambulatory Visit: Payer: Medicare Other | Admitting: Physical Therapy

## 2015-09-18 DIAGNOSIS — M25611 Stiffness of right shoulder, not elsewhere classified: Secondary | ICD-10-CM

## 2015-09-18 DIAGNOSIS — M25511 Pain in right shoulder: Secondary | ICD-10-CM

## 2015-09-18 NOTE — Therapy (Signed)
[facility] [place] [address]  [place], [place], [address] Phone: [phone]   Fax:  [phone]  Physical Therapy Treatment  Patient Details  Name: [name] [name] MRN: [id number] Date of Birth: [birth year] Referring Provider: Kathryne Hitch, MD  Encounter Date: 09/18/2015      PT End of Session - 09/18/15 1100    Visit Number 13   Number of Visits 20   Date for PT Re-Evaluation 10/07/15   PT Start Time 1028   PT Stop Time 1115   PT Time Calculation (min) 47 min   Activity Tolerance Patient tolerated treatment well   Behavior During Therapy [facility] for tasks assessed/performed      Past Medical History  Diagnosis Date  . Allergy   . Hypertension   . Hyperlipidemia   . IBS (irritable bowel syndrome)   . Arthritis   . Full dentures   . Wears glasses     readers  . PONV (postoperative nausea and vomiting)   . Asthma     Bronchial   . Headache     migraines  . Celiac disease     Past Surgical History  Procedure Laterality Date  . Shoulder surgery      left  . Elbow surgery    . Knee surgery      lt  . Carpal tunnel release      rt  . Varicose vein surgery    . Nasal septum surgery    . Abdominal hysterectomy    . Cholecystectomy    . Appendectomy    . Tonsillectomy and adenoidectomy    . Breast lumpectomy  1985    lt  . Eye surgery      both cataracts  . Wrist arthroscopy Right 09/20/2013    Procedure: RIGHT ARTHROSCOPY WRIST EXCISION PISIFORM;  Surgeon: Cammie Sickle, MD;  Location: [place];  Service: Orthopedics;  Laterality: Right;  . Shoulder arthroscopy with bicepstenotomy Right 05/09/2014    Procedure: SHOULDER ARTHROSCOPY WITH BICEPSTENOTOMY;  Surgeon: Ninetta Lights, MD;  Location: [place];  Service: Orthopedics;  Laterality: Right;  Debridement Rotator Cuff Tear  . Shoulder acromioplasty Right 05/09/2014    Procedure: SHOULDER ACROMIOPLASTY;  Surgeon:  Ninetta Lights, MD;  Location: [place];  Service: Orthopedics;  Laterality: Right;  . Shoulder arthroscopy with rotator cuff repair Right 12/19/2014    Procedure: RIGHT SHOULDER ARTHROSCOPY WITH ROTATOR CUFF REPAIR,CALCIFIC DEBRIDEMENT EXTENSIVE;  Surgeon: Ninetta Lights, MD;  Location: [place];  Service: Orthopedics;  Laterality: Right;  . Shoulder arthroscopy with rotator cuff repair Right 07/10/2015    Procedure: RIGHT SHOULDER ARTHROSCOPY, DEBRIDEMENT  WITH ROTATOR CUFF REPAIR;  Surgeon: Ninetta Lights, MD;  Location: [place];  Service: Orthopedics;  Laterality: Right;    There were no vitals filed for this visit.      Subjective Assessment - 09/18/15 1032    Subjective shoulder is still around a 5-5.5   Patient Stated Goals "get back to normal"   Currently in Pain? Yes   Pain Score 5    Pain Location Shoulder   Pain Orientation Right   Pain Descriptors / Indicators Aching   Pain Type Surgical pain   Pain Onset More than a month ago   Pain Frequency Constant   Aggravating Factors  overuse   Pain Relieving Factors medicaton  [facility] Adult PT Treatment/Exercise - 09/18/15 1034    Shoulder Exercises: Supine   Other Supine Exercises scap retraction isometric 15x5"; isometric extension 10x5"   Shoulder Exercises: Prone   Extension Right;15 reps;Limitations   Extension Limitations to neutral   Shoulder Exercises: Pulleys   Flexion 3 minutes   ABduction 3 minutes   ABduction Limitations scaption   Vasopneumatic   Number Minutes Vasopneumatic  15 minutes   Vasopnuematic Location  Shoulder   Vasopneumatic Pressure Low   Vasopneumatic Temperature  32   Manual Therapy   Manual Therapy Passive ROM   Passive ROM supine R shoulder flexion, ER and abdct to tolerance and within protocol limitations                  PT Short Term Goals - 09/05/15 1109    PT SHORT TERM GOAL #1    Title pt able to progress to AAROM exercises by 09/09/15   Status Achieved   PT SHORT TERM GOAL #2   Title R Shoulder PROM Flexion to 140 and ER to 60 at 80 ABD by 09/09/15  ER goal met at 45ish ABD   Status Partially Met           PT Long Term Goals - 09/05/15 1110    PT LONG TERM GOAL #1   Title independent with advanced HEP as necessary by 10/07/15   Status On-going   PT LONG TERM GOAL #2   Title R shoulder AROM WFL and MMT 4/5 or better all planes by 10/07/15   Status On-going   PT LONG TERM GOAL #3   Title pt able to perform all ADLs and chores without limitation by R shoulder pain or weakness by 10/07/15   Status On-going               Plan - 09/18/15 1100    Clinical Impression Statement Pt tolerated exercises well with abduction and er PROM at protocol limits.  Will be able to progress to theraband activities next week.   PT Next Visit Plan measure PROM; continue AAROM and begin light theraband work per protocol   Consulted and Agree with Plan of Care Patient      Patient will benefit from skilled therapeutic intervention in order to improve the following deficits and impairments:     Visit Diagnosis: Pain in right shoulder  Stiffness of right shoulder, not elsewhere classified     Problem List Patient Active Problem List   Diagnosis Date Noted  . Preop cardiovascular exam 07/09/2015  . Chest pain 05/09/2013  . Hypertension 02/23/2012  . Hyperlipidemia 02/23/2012  . Right shoulder pain 01/25/2012  . Left wrist pain 01/16/2012  . Neck pain 12/26/2011   Laureen Abrahams, PT, DPT 09/18/2015 12:52 PM  [facility] [address]  [place], [place], [address] Phone: [phone]   Fax:  [phone]  Name: JAMECIA LERMAN MRN: [id number] Date of Birth: July 24, 1945

## 2015-09-22 ENCOUNTER — Ambulatory Visit: Payer: Medicare Other | Admitting: Physical Therapy

## 2015-09-22 DIAGNOSIS — M25511 Pain in right shoulder: Secondary | ICD-10-CM | POA: Diagnosis not present

## 2015-09-22 DIAGNOSIS — M25611 Stiffness of right shoulder, not elsewhere classified: Secondary | ICD-10-CM

## 2015-09-22 NOTE — Therapy (Signed)
[facility] [place] [address]  [place], [place], [address] Phone: [phone]   Fax:  [phone]  Physical Therapy Treatment  Patient Details  Name: [name] [name] MRN: [id number] Date of Birth: 09/21/1945 Referring Provider: Kathryne Hitch, MD  Encounter Date: 09/22/2015      PT End of Session - 09/22/15 8527    Visit Number 14   Number of Visits 20   Date for PT Re-Evaluation 10/07/15   PT Start Time 1030   PT Stop Time 1123   PT Time Calculation (min) 53 min   Activity Tolerance Patient tolerated treatment well   Behavior During Therapy [facility] for tasks assessed/performed      Past Medical History  Diagnosis Date  . Allergy   . Hypertension   . Hyperlipidemia   . IBS (irritable bowel syndrome)   . Arthritis   . Full dentures   . Wears glasses     readers  . PONV (postoperative nausea and vomiting)   . Asthma     Bronchial   . Headache     migraines  . Celiac disease     Past Surgical History  Procedure Laterality Date  . Shoulder surgery      left  . Elbow surgery    . Knee surgery      lt  . Carpal tunnel release      rt  . Varicose vein surgery    . Nasal septum surgery    . Abdominal hysterectomy    . Cholecystectomy    . Appendectomy    . Tonsillectomy and adenoidectomy    . Breast lumpectomy  1985    lt  . Eye surgery      both cataracts  . Wrist arthroscopy Right 09/20/2013    Procedure: RIGHT ARTHROSCOPY WRIST EXCISION PISIFORM;  Surgeon: Cammie Sickle, MD;  Location: [place];  Service: Orthopedics;  Laterality: Right;  . Shoulder arthroscopy with bicepstenotomy Right 05/09/2014    Procedure: SHOULDER ARTHROSCOPY WITH BICEPSTENOTOMY;  Surgeon: Ninetta Lights, MD;  Location: [place];  Service: Orthopedics;  Laterality: Right;  Debridement Rotator Cuff Tear  . Shoulder acromioplasty Right 05/09/2014    Procedure: SHOULDER ACROMIOPLASTY;  Surgeon:  Ninetta Lights, MD;  Location: [place];  Service: Orthopedics;  Laterality: Right;  . Shoulder arthroscopy with rotator cuff repair Right 12/19/2014    Procedure: RIGHT SHOULDER ARTHROSCOPY WITH ROTATOR CUFF REPAIR,CALCIFIC DEBRIDEMENT EXTENSIVE;  Surgeon: Ninetta Lights, MD;  Location: [place];  Service: Orthopedics;  Laterality: Right;  . Shoulder arthroscopy with rotator cuff repair Right 07/10/2015    Procedure: RIGHT SHOULDER ARTHROSCOPY, DEBRIDEMENT  WITH ROTATOR CUFF REPAIR;  Surgeon: Ninetta Lights, MD;  Location: [place];  Service: Orthopedics;  Laterality: Right;    There were no vitals filed for this visit.      Subjective Assessment - 09/22/15 1028    Subjective "it's going" saturday was great; but sunday was "wicked."   Patient Stated Goals "get back to normal"   Currently in Pain? Yes   Pain Score 5    Pain Location Shoulder   Pain Orientation Right   Pain Descriptors / Indicators Aching   Pain Type Surgical pain   Pain Onset More than a month ago   Pain Frequency Constant   Aggravating Factors  overuse   Pain Relieving Factors medication  [facility] Adult PT Treatment/Exercise - 09/22/15 1032    Shoulder Exercises: Seated   Elevation 15 reps;Both   Retraction Both;15 reps   Other Seated Exercises scapular depression x 15   Shoulder Exercises: Prone   Retraction Both;15 reps   Extension Right;15 reps;Limitations   Extension Limitations to neutral   Shoulder Exercises: Pulleys   Flexion 3 minutes   ABduction 3 minutes   ABduction Limitations scaption   Modalities   Modalities Ultrasound   Ultrasound   Ultrasound Location R bicep/middle deltoid   Ultrasound Parameters 50% DC, 1.0 w/cm2, 1 mHz freq x 8 min   Ultrasound Goals Edema;Other (Comment)  tissue extensibility   Vasopneumatic   Number Minutes Vasopneumatic  15 minutes   Vasopnuematic Location  Shoulder    Vasopneumatic Pressure Low   Vasopneumatic Temperature  32      Manual: STM to R biceps and R middle deltoid x 9 min for tissue extensibility and trigger point release.            PT Short Term Goals - 09/05/15 1109    PT SHORT TERM GOAL #1   Title pt able to progress to AAROM exercises by 09/09/15   Status Achieved   PT SHORT TERM GOAL #2   Title R Shoulder PROM Flexion to 140 and ER to 60 at 80 ABD by 09/09/15  ER goal met at 45ish ABD   Status Partially Met           PT Long Term Goals - 09/05/15 1110    PT LONG TERM GOAL #1   Title independent with advanced HEP as necessary by 10/07/15   Status On-going   PT LONG TERM GOAL #2   Title R shoulder AROM WFL and MMT 4/5 or better all planes by 10/07/15   Status On-going   PT LONG TERM GOAL #3   Title pt able to perform all ADLs and chores without limitation by R shoulder pain or weakness by 10/07/15   Status On-going               Plan - 09/22/15 1241    Clinical Impression Statement Pt tolerated exercises well today; will be able to progress to next phase next session as pain allows.   PT Next Visit Plan measure PROM; continue AAROM and begin light theraband work per protocol   Consulted and Agree with Plan of Care Patient      Patient will benefit from skilled therapeutic intervention in order to improve the following deficits and impairments:     Visit Diagnosis: Pain in right shoulder  Stiffness of right shoulder, not elsewhere classified     Problem List Patient Active Problem List   Diagnosis Date Noted  . Preop cardiovascular exam 07/09/2015  . Chest pain 05/09/2013  . Hypertension 02/23/2012  . Hyperlipidemia 02/23/2012  . Right shoulder pain 01/25/2012  . Left wrist pain 01/16/2012  . Neck pain 12/26/2011   Laureen Abrahams, PT, DPT 09/22/2015 12:43 PM  [facility] [address]  [place], [place], [address] Phone:  [phone]   Fax:  [phone]  Name: [name] [name] MRN: [id number] Date of Birth: Sep 23, 1945

## 2015-09-25 ENCOUNTER — Ambulatory Visit: Payer: Medicare Other | Admitting: Physical Therapy

## 2015-09-25 DIAGNOSIS — M25511 Pain in right shoulder: Secondary | ICD-10-CM

## 2015-09-25 DIAGNOSIS — M25611 Stiffness of right shoulder, not elsewhere classified: Secondary | ICD-10-CM

## 2015-09-25 NOTE — Therapy (Signed)
[facility] [place] [facility] [address]  [place], [place], [address] Phone: [phone]   Fax:  [phone]  Physical Therapy Treatment  Patient Details  Name: [name] [name] MRN: [id number] Date of Birth: 1945/09/14 Referring Provider: Kathryne Hitch, MD  Encounter Date: 09/25/2015      PT End of Session - 09/25/15 1134    Visit Number 15   Number of Visits 20   Date for PT Re-Evaluation 10/07/15   PT Start Time 1030   PT Stop Time 1110   PT Time Calculation (min) 40 min   Activity Tolerance Patient tolerated treatment well   Behavior During Therapy [facility] for tasks assessed/performed      Past Medical History  Diagnosis Date  . Allergy   . Hypertension   . Hyperlipidemia   . IBS (irritable bowel syndrome)   . Arthritis   . Full dentures   . Wears glasses     readers  . PONV (postoperative nausea and vomiting)   . Asthma     Bronchial   . Headache     migraines  . Celiac disease     Past Surgical History  Procedure Laterality Date  . Shoulder surgery      left  . Elbow surgery    . Knee surgery      lt  . Carpal tunnel release      rt  . Varicose vein surgery    . Nasal septum surgery    . Abdominal hysterectomy    . Cholecystectomy    . Appendectomy    . Tonsillectomy and adenoidectomy    . Breast lumpectomy  1985    lt  . Eye surgery      both cataracts  . Wrist arthroscopy Right 09/20/2013    Procedure: RIGHT ARTHROSCOPY WRIST EXCISION PISIFORM;  Surgeon: Cammie Sickle, MD;  Location: [place];  Service: Orthopedics;  Laterality: Right;  . Shoulder arthroscopy with bicepstenotomy Right 05/09/2014    Procedure: SHOULDER ARTHROSCOPY WITH BICEPSTENOTOMY;  Surgeon: Ninetta Lights, MD;  Location: [place];  Service: Orthopedics;  Laterality: Right;  Debridement Rotator Cuff Tear  . Shoulder acromioplasty Right 05/09/2014    Procedure: SHOULDER ACROMIOPLASTY;  Surgeon:  Ninetta Lights, MD;  Location: [place];  Service: Orthopedics;  Laterality: Right;  . Shoulder arthroscopy with rotator cuff repair Right 12/19/2014    Procedure: RIGHT SHOULDER ARTHROSCOPY WITH ROTATOR CUFF REPAIR,CALCIFIC DEBRIDEMENT EXTENSIVE;  Surgeon: Ninetta Lights, MD;  Location: [place];  Service: Orthopedics;  Laterality: Right;  . Shoulder arthroscopy with rotator cuff repair Right 07/10/2015    Procedure: RIGHT SHOULDER ARTHROSCOPY, DEBRIDEMENT  WITH ROTATOR CUFF REPAIR;  Surgeon: Ninetta Lights, MD;  Location: [place];  Service: Orthopedics;  Laterality: Right;    There were no vitals filed for this visit.      Subjective Assessment - 09/25/15 1035    Subjective shoulder is the same as ususal stuck between 4 and 5   Diagnostic tests MRI yesterday indicates C6 nerve root impingement   Patient Stated Goals "get back to normal"   Pain Score 5    Pain Location Shoulder   Pain Orientation Right   Pain Descriptors / Indicators Aching   Pain Type Surgical pain   Pain Onset More than a month ago   Pain Frequency Constant   Aggravating Factors  overuse   Pain Relieving Factors medication  [facility] Adult PT Treatment/Exercise - 09/25/15 1036    Shoulder Exercises: Standing   External Rotation Right;10 reps;Theraband   Theraband Level (Shoulder External Rotation) Level 1 (Yellow)   External Rotation Limitations to tolerance   Internal Rotation Right;10 reps;Theraband   Theraband Level (Shoulder Internal Rotation) Level 1 (Yellow)   Extension Right;10 reps;Theraband   Theraband Level (Shoulder Extension) Level 1 (Yellow)   Retraction Right;10 reps;Theraband   Theraband Level (Shoulder Retraction) Level 1 (Yellow)   Shoulder Exercises: Pulleys   Flexion 3 minutes   ABduction 3 minutes   ABduction Limitations scaption   Shoulder Exercises: Isometric Strengthening   Flexion Limitations  10x10" with gentle activation   Extension Limitations 10x10"   Internal Rotation Limitations 10x10"   ABduction Limitations 10x10"   ADduction Limitations 10x10"   Manual Therapy   Manual Therapy Passive ROM   Passive ROM supine R shoulder flexion, ER and abdct to tolerance and within protocol limitations                  PT Short Term Goals - 09/05/15 1109    PT SHORT TERM GOAL #1   Title pt able to progress to AAROM exercises by 09/09/15   Status Achieved   PT SHORT TERM GOAL #2   Title R Shoulder PROM Flexion to 140 and ER to 60 at 80 ABD by 09/09/15  ER goal met at 45ish ABD   Status Partially Met           PT Long Term Goals - 09/05/15 1110    PT LONG TERM GOAL #1   Title independent with advanced HEP as necessary by 10/07/15   Status On-going   PT LONG TERM GOAL #2   Title R shoulder AROM WFL and MMT 4/5 or better all planes by 10/07/15   Status On-going   PT LONG TERM GOAL #3   Title pt able to perform all ADLs and chores without limitation by R shoulder pain or weakness by 10/07/15   Status On-going               Plan - 09/25/15 1134    Clinical Impression Statement Able to progress to theraband exercises today and reports no increase in pain.  Pt deferred ice today as vaso unavailable and reports she will ice at home.   PT Next Visit Plan measure PROM; continue AAROM and begin light theraband work per protocol   Consulted and Agree with Plan of Care Patient      Patient will benefit from skilled therapeutic intervention in order to improve the following deficits and impairments:     Visit Diagnosis: Pain in right shoulder  Stiffness of right shoulder, not elsewhere classified     Problem List Patient Active Problem List   Diagnosis Date Noted  . Preop cardiovascular exam 07/09/2015  . Chest pain 05/09/2013  . Hypertension 02/23/2012  . Hyperlipidemia 02/23/2012  . Right shoulder pain 01/25/2012  . Left wrist pain 01/16/2012  . Neck  pain 12/26/2011   Stephanie F Matthews, PT, DPT 09/25/2015 11:39 AM  [facility] [place] [facility] [address]  [address] [place], [place], [address] Phone: [phone]   Fax:  [phone]  Name: Margarite C Gazda MRN: [id number] Date of Birth: 02/17/1946     

## 2015-09-29 ENCOUNTER — Ambulatory Visit: Payer: Medicare Other | Admitting: Physical Therapy

## 2015-09-29 DIAGNOSIS — M25511 Pain in right shoulder: Secondary | ICD-10-CM | POA: Diagnosis not present

## 2015-09-29 DIAGNOSIS — M25611 Stiffness of right shoulder, not elsewhere classified: Secondary | ICD-10-CM

## 2015-09-29 NOTE — Therapy (Signed)
[facility] [address]  [place], [place], [address] Phone: [phone]   Fax:  [phone]  Physical Therapy Treatment  Patient Details  Name: [name] [name] MRN: [id number] Date of Birth: [birth year] Referring Provider: Kathryne Hitch, MD  Encounter Date: 09/29/2015      PT End of Session - 09/29/15 1402    Visit Number 16   Number of Visits 20   Date for PT Re-Evaluation 10/07/15   PT Start Time 1022   PT Stop Time 1117   PT Time Calculation (min) 55 min   Activity Tolerance Patient tolerated treatment well   Behavior During Therapy [facility] for tasks assessed/performed      Past Medical History  Diagnosis Date  . Allergy   . Hypertension   . Hyperlipidemia   . IBS (irritable bowel syndrome)   . Arthritis   . Full dentures   . Wears glasses     readers  . PONV (postoperative nausea and vomiting)   . Asthma     Bronchial   . Headache     migraines  . Celiac disease     Past Surgical History  Procedure Laterality Date  . Shoulder surgery      left  . Elbow surgery    . Knee surgery      lt  . Carpal tunnel release      rt  . Varicose vein surgery    . Nasal septum surgery    . Abdominal hysterectomy    . Cholecystectomy    . Appendectomy    . Tonsillectomy and adenoidectomy    . Breast lumpectomy  1985    lt  . Eye surgery      both cataracts  . Wrist arthroscopy Right 09/20/2013    Procedure: RIGHT ARTHROSCOPY WRIST EXCISION PISIFORM;  Surgeon: Cammie Sickle, MD;  Location: [place];  Service: Orthopedics;  Laterality: Right;  . Shoulder arthroscopy with bicepstenotomy Right 05/09/2014    Procedure: SHOULDER ARTHROSCOPY WITH BICEPSTENOTOMY;  Surgeon: Ninetta Lights, MD;  Location: [place];  Service: Orthopedics;  Laterality: Right;  Debridement Rotator Cuff Tear  . Shoulder acromioplasty Right 05/09/2014    Procedure: SHOULDER ACROMIOPLASTY;  Surgeon:  Ninetta Lights, MD;  Location: [place];  Service: Orthopedics;  Laterality: Right;  . Shoulder arthroscopy with rotator cuff repair Right 12/19/2014    Procedure: RIGHT SHOULDER ARTHROSCOPY WITH ROTATOR CUFF REPAIR,CALCIFIC DEBRIDEMENT EXTENSIVE;  Surgeon: Ninetta Lights, MD;  Location: [place];  Service: Orthopedics;  Laterality: Right;  . Shoulder arthroscopy with rotator cuff repair Right 07/10/2015    Procedure: RIGHT SHOULDER ARTHROSCOPY, DEBRIDEMENT  WITH ROTATOR CUFF REPAIR;  Surgeon: Ninetta Lights, MD;  Location: [place];  Service: Orthopedics;  Laterality: Right;    There were no vitals filed for this visit.      Subjective Assessment - 09/29/15 1023    Subjective shoulder is sore today; having some pain down forearm to thumb today.     Patient Stated Goals "get back to normal"   Currently in Pain? Yes   Pain Score 5    Pain Location Shoulder   Pain Orientation Right   Pain Descriptors / Indicators Aching   Pain Type Surgical pain   Pain Onset More than a month ago   Pain Frequency Constant   Aggravating Factors  overuse   Pain Relieving Factors medication  [facility] PT Assessment - 09/29/15 0001    PROM   Right Shoulder Flexion 120 Degrees   Right Shoulder ABduction 90 Degrees   Right Shoulder Internal Rotation 74 Degrees   Right Shoulder External Rotation 47 Degrees                     [facility] Adult PT Treatment/Exercise - 09/29/15 1026    Shoulder Exercises: Supine   Protraction Right;20 reps;Weights   Protraction Weight (lbs) 3   Protraction Limitations mod cues for tecnhnique   Shoulder Exercises: Standing   External Rotation Right;20 reps;Theraband   Theraband Level (Shoulder External Rotation) Level 1 (Yellow)   Internal Rotation Right;20 reps;Theraband   Theraband Level (Shoulder Internal Rotation) Level 1 (Yellow)   Extension Right;20 reps;Theraband   Theraband Level (Shoulder  Extension) Level 1 (Yellow)   Retraction Right;20 reps;Theraband   Theraband Level (Shoulder Retraction) Level 1 (Yellow)   Shoulder Exercises: Pulleys   Flexion 3 minutes   ABduction 3 minutes   ABduction Limitations scaption   Vasopneumatic   Number Minutes Vasopneumatic  15 minutes   Vasopnuematic Location  Shoulder   Vasopneumatic Pressure Low   Vasopneumatic Temperature  32   Manual Therapy   Manual Therapy Passive ROM   Passive ROM supine R shoulder flexion, ER and abdct to tolerance and within protocol limitations                  PT Short Term Goals - 09/05/15 1109    PT SHORT TERM GOAL #1   Title pt able to progress to AAROM exercises by 09/09/15   Status Achieved   PT SHORT TERM GOAL #2   Title R Shoulder PROM Flexion to 140 and ER to 60 at 80 ABD by 09/09/15  ER goal met at 45ish ABD   Status Partially Met           PT Long Term Goals - 09/05/15 1110    PT LONG TERM GOAL #1   Title independent with advanced HEP as necessary by 10/07/15   Status On-going   PT LONG TERM GOAL #2   Title R shoulder AROM WFL and MMT 4/5 or better all planes by 10/07/15   Status On-going   PT LONG TERM GOAL #3   Title pt able to perform all ADLs and chores without limitation by R shoulder pain or weakness by 10/07/15   Status On-going               Plan - 09/29/15 1403    Clinical Impression Statement Pt with improvements in PROM from initial evaluation and tolerates exercises without increase in pain.  Pain continue to be elevated consistently around 5-6/10.  Will continue to benefit from PT to maxmize function.   PT Next Visit Plan continue AAROM and begin light theraband work per protocol   Consulted and Agree with Plan of Care Patient      Patient will benefit from skilled therapeutic intervention in order to improve the following deficits and impairments:     Visit Diagnosis: Pain in right shoulder  Stiffness of right shoulder, not elsewhere  classified     Problem List Patient Active Problem List   Diagnosis Date Noted  . Preop cardiovascular exam 07/09/2015  . Chest pain 05/09/2013  . Hypertension 02/23/2012  . Hyperlipidemia 02/23/2012  . Right shoulder pain 01/25/2012  . Left wrist pain 01/16/2012  . Neck pain 12/26/2011   Laureen Abrahams, PT, DPT 09/29/2015 2:06  PM  [facility] [address]  [place], [place], [address] Phone: [phone]   Fax:  [phone]  Name: [name] [name] MRN: [id number] Date of Birth: 1945/11/11

## 2015-10-02 ENCOUNTER — Ambulatory Visit: Payer: Medicare Other | Admitting: Physical Therapy

## 2015-10-02 DIAGNOSIS — M25511 Pain in right shoulder: Secondary | ICD-10-CM

## 2015-10-02 DIAGNOSIS — M25611 Stiffness of right shoulder, not elsewhere classified: Secondary | ICD-10-CM

## 2015-10-02 NOTE — Therapy (Signed)
[facility] [address]  [place], [place], [address] Phone: [phone]   Fax:  [phone]  Physical Therapy Treatment  Patient Details  Name: [name] [name] MRN: [id number] Date of Birth: [birth year] Referring Provider: Kathryne Hitch, MD  Encounter Date: 10/02/2015      PT End of Session - 10/02/15 1109    Visit Number 17   Number of Visits 20   Date for PT Re-Evaluation 10/07/15   PT Start Time 3382  pt arrived late   PT Stop Time 1125   PT Time Calculation (min) 50 min   Activity Tolerance Patient tolerated treatment well;No increased pain   Behavior During Therapy [facility] for tasks assessed/performed      Past Medical History  Diagnosis Date  . Allergy   . Hypertension   . Hyperlipidemia   . IBS (irritable bowel syndrome)   . Arthritis   . Full dentures   . Wears glasses     readers  . PONV (postoperative nausea and vomiting)   . Asthma     Bronchial   . Headache     migraines  . Celiac disease     Past Surgical History  Procedure Laterality Date  . Shoulder surgery      left  . Elbow surgery    . Knee surgery      lt  . Carpal tunnel release      rt  . Varicose vein surgery    . Nasal septum surgery    . Abdominal hysterectomy    . Cholecystectomy    . Appendectomy    . Tonsillectomy and adenoidectomy    . Breast lumpectomy  1985    lt  . Eye surgery      both cataracts  . Wrist arthroscopy Right 09/20/2013    Procedure: RIGHT ARTHROSCOPY WRIST EXCISION PISIFORM;  Surgeon: Cammie Sickle, MD;  Location: [place];  Service: Orthopedics;  Laterality: Right;  . Shoulder arthroscopy with bicepstenotomy Right 05/09/2014    Procedure: SHOULDER ARTHROSCOPY WITH BICEPSTENOTOMY;  Surgeon: Ninetta Lights, MD;  Location: [place];  Service: Orthopedics;  Laterality: Right;  Debridement Rotator Cuff Tear  . Shoulder acromioplasty Right 05/09/2014    Procedure:  SHOULDER ACROMIOPLASTY;  Surgeon: Ninetta Lights, MD;  Location: [place];  Service: Orthopedics;  Laterality: Right;  . Shoulder arthroscopy with rotator cuff repair Right 12/19/2014    Procedure: RIGHT SHOULDER ARTHROSCOPY WITH ROTATOR CUFF REPAIR,CALCIFIC DEBRIDEMENT EXTENSIVE;  Surgeon: Ninetta Lights, MD;  Location: [place];  Service: Orthopedics;  Laterality: Right;  . Shoulder arthroscopy with rotator cuff repair Right 07/10/2015    Procedure: RIGHT SHOULDER ARTHROSCOPY, DEBRIDEMENT  WITH ROTATOR CUFF REPAIR;  Surgeon: Ninetta Lights, MD;  Location: [place];  Service: Orthopedics;  Laterality: Right;    There were no vitals filed for this visit.      Subjective Assessment - 10/02/15 1037    Subjective arrived late - rand into traffic and delays at bank   Patient Stated Goals "get back to normal"   Currently in Pain? Yes   Pain Score 4    Pain Location Shoulder   Pain Orientation Right   Pain Descriptors / Indicators Aching;Constant   Pain Type Surgical pain   Pain Onset More than a month ago   Pain Frequency Constant   Aggravating Factors  overuse   Pain Relieving Factors meds  [facility] Adult PT Treatment/Exercise - 10/02/15 1040    Shoulder Exercises: Standing   External Rotation AAROM;Right;15 reps   External Rotation Limitations with cane   Flexion AAROM;Right;15 reps   Flexion Limitations with cane   ABduction AAROM;Right;15 reps   ABduction Limitations with cane   Extension AAROM;Right;15 reps   Extension Limitations with cane   Shoulder Exercises: Pulleys   Flexion 3 minutes   ABduction 3 minutes   ABduction Limitations scaption   Shoulder Exercises: Isometric Strengthening   Flexion Limitations 10x10" with gentle activation   Extension Limitations 10x10"   Internal Rotation Limitations 10x10"   ABduction Limitations 10x10"   ADduction Limitations 10x10"   Vasopneumatic    Number Minutes Vasopneumatic  15 minutes   Vasopnuematic Location  Shoulder   Vasopneumatic Pressure Low   Vasopneumatic Temperature  32                  PT Short Term Goals - 09/05/15 1109    PT SHORT TERM GOAL #1   Title pt able to progress to AAROM exercises by 09/09/15   Status Achieved   PT SHORT TERM GOAL #2   Title R Shoulder PROM Flexion to 140 and ER to 60 at 80 ABD by 09/09/15  ER goal met at 45ish ABD   Status Partially Met           PT Long Term Goals - 09/05/15 1110    PT LONG TERM GOAL #1   Title independent with advanced HEP as necessary by 10/07/15   Status On-going   PT LONG TERM GOAL #2   Title R shoulder AROM WFL and MMT 4/5 or better all planes by 10/07/15   Status On-going   PT LONG TERM GOAL #3   Title pt able to perform all ADLs and chores without limitation by R shoulder pain or weakness by 10/07/15   Status On-going               Plan - 10/02/15 1109    Clinical Impression Statement Pt tolerated standing cane exercises well today without c/o increased pain; but pain continues to be around 4-5/10.  Will continue to benefit from PT to maximize function.   PT Next Visit Plan needs MD note; progress per protocol   Consulted and Agree with Plan of Care Patient      Patient will benefit from skilled therapeutic intervention in order to improve the following deficits and impairments:     Visit Diagnosis: Pain in right shoulder  Stiffness of right shoulder, not elsewhere classified     Problem List Patient Active Problem List   Diagnosis Date Noted  . Preop cardiovascular exam 07/09/2015  . Chest pain 05/09/2013  . Hypertension 02/23/2012  . Hyperlipidemia 02/23/2012  . Right shoulder pain 01/25/2012  . Left wrist pain 01/16/2012  . Neck pain 12/26/2011   Laureen Abrahams, PT, DPT 10/02/2015 2:19 PM  [facility] [address]  [place], [place],  [address] Phone: [phone]   Fax:  [phone]  Name: [name] [name] MRN: [id number] Date of Birth: [birth year]

## 2015-10-06 ENCOUNTER — Ambulatory Visit: Payer: Medicare Other

## 2015-10-13 ENCOUNTER — Ambulatory Visit: Payer: Medicare Other | Attending: Orthopedic Surgery | Admitting: Physical Therapy

## 2015-10-13 DIAGNOSIS — M25511 Pain in right shoulder: Secondary | ICD-10-CM | POA: Insufficient documentation

## 2015-10-13 DIAGNOSIS — M25611 Stiffness of right shoulder, not elsewhere classified: Secondary | ICD-10-CM | POA: Insufficient documentation

## 2015-10-13 NOTE — Therapy (Signed)
[facility] [address]  [address] [place], [place], [address] Phone: [phone]   Fax:  [phone]  Physical Therapy Evaluation  Patient Details  Name: [name] [name] MRN: [id number] Date of Birth: 1946/02/28 Referring Provider: Mckinley Jewel, MD  Encounter Date: 10/13/2015      PT End of Session - 10/13/15 1014    Visit Number 18   Number of Visits 26   Date for PT Re-Evaluation 11/10/15   PT Start Time 0930   PT Stop Time 1025   PT Time Calculation (min) 55 min   Activity Tolerance Patient tolerated treatment well;No increased pain;Patient limited by pain   Behavior During Therapy [facility] for tasks assessed/performed      Past Medical History  Diagnosis Date  . Allergy   . Hypertension   . Hyperlipidemia   . IBS (irritable bowel syndrome)   . Arthritis   . Full dentures   . Wears glasses     readers  . PONV (postoperative nausea and vomiting)   . Asthma     Bronchial   . Headache     migraines  . Celiac disease     Past Surgical History  Procedure Laterality Date  . Shoulder surgery      left  . Elbow surgery    . Knee surgery      lt  . Carpal tunnel release      rt  . Varicose vein surgery    . Nasal septum surgery    . Abdominal hysterectomy    . Cholecystectomy    . Appendectomy    . Tonsillectomy and adenoidectomy    . Breast lumpectomy  1985    lt  . Eye surgery      both cataracts  . Wrist arthroscopy Right 09/20/2013    Procedure: RIGHT ARTHROSCOPY WRIST EXCISION PISIFORM;  Surgeon: Wyn Forster, MD;  Location: [facility];  Service: Orthopedics;  Laterality: Right;  . Shoulder arthroscopy with bicepstenotomy Right 05/09/2014    Procedure: SHOULDER ARTHROSCOPY WITH BICEPSTENOTOMY;  Surgeon: Loreta Ave, MD;  Location: [facility];  Service: Orthopedics;  Laterality: Right;  Debridement Rotator Cuff Tear  . Shoulder acromioplasty Right 05/09/2014   Procedure: SHOULDER ACROMIOPLASTY;  Surgeon: Loreta Ave, MD;  Location: [facility];  Service: Orthopedics;  Laterality: Right;  . Shoulder arthroscopy with rotator cuff repair Right 12/19/2014    Procedure: RIGHT SHOULDER ARTHROSCOPY WITH ROTATOR CUFF REPAIR,CALCIFIC DEBRIDEMENT EXTENSIVE;  Surgeon: Loreta Ave, MD;  Location: [facility];  Service: Orthopedics;  Laterality: Right;  . Shoulder arthroscopy with rotator cuff repair Right 07/10/2015    Procedure: RIGHT SHOULDER ARTHROSCOPY, DEBRIDEMENT  WITH ROTATOR CUFF REPAIR;  Surgeon: Loreta Ave, MD;  Location: [facility];  Service: Orthopedics;  Laterality: Right;    There were no vitals filed for this visit.       Subjective Assessment - 10/13/15 0931    Subjective "I had no time to think about myself last week."  Just flew back last night from being with daughter (had some medical testing done).   Patient Stated Goals "get back to normal"   Currently in Pain? Yes   Pain Score 4    Pain Location Shoulder   Pain Orientation Right   Pain Descriptors / Indicators Aching;Constant   Pain Type Surgical pain   Pain Onset More than a month ago   Pain Frequency  Constant   Aggravating Factors  overuse   Pain Relieving Factors meds            [facility] PT Assessment - 10/13/15 0941    ROM / Strength   AROM / PROM / Strength Strength   AROM   Right Shoulder Flexion 100 Degrees   Right Shoulder ABduction 90 Degrees   Right Shoulder Internal Rotation 55 Degrees   Right Shoulder External Rotation 26 Degrees   PROM   Right Shoulder Flexion 116 Degrees  seated   Right Shoulder ABduction 125 Degrees  seated   Right Shoulder Internal Rotation 68 Degrees   Right Shoulder External Rotation 40 Degrees   Strength   Overall Strength Comments R shoulder strength not tested due to decreased motion and pain                   [facility] Adult PT Treatment/Exercise - 10/13/15 0955     Shoulder Exercises: Supine   External Rotation AAROM;Right;Limitations;20 reps   External Rotation Limitations with cane   Flexion AAROM;20 reps   Flexion Limitations with cane   Other Supine Exercises chest press with cane x 20   Shoulder Exercises: Pulleys   Flexion 3 minutes   ABduction 3 minutes   ABduction Limitations scaption   Vasopneumatic   Number Minutes Vasopneumatic  15 minutes   Vasopnuematic Location  Shoulder   Vasopneumatic Pressure Low   Vasopneumatic Temperature  32                  PT Short Term Goals - 09/05/15 1109    PT SHORT TERM GOAL #1   Title pt able to progress to AAROM exercises by 09/09/15   Status Achieved   PT SHORT TERM GOAL #2   Title R Shoulder PROM Flexion to 140 and ER to 60 at 80 ABD by 09/09/15  ER goal met at 45ish ABD   Status Partially Met           PT Long Term Goals - 10/13/15 0936    PT LONG TERM GOAL #1   Title independent with advanced HEP as necessary by 11/10/15   Status On-going   PT LONG TERM GOAL #2   Title R shoulder AROM WFL and MMT 4/5 or better all planes by 11/10/15   Status On-going   PT LONG TERM GOAL #3   Title pt able to perform all ADLs and chores without limitation by R shoulder pain or weakness by 11/10/15   PT LONG TERM GOAL #4   Title n/a   PT LONG TERM GOAL #5   Title n/a               Plan - 10/13/15 1310    Clinical Impression Statement Pt has demonstrated slow progress towards goals but continues to demonstrate significant ROM and strength limitations as well as continued pain.  Pt demonstrates substitution patterns with difficulty correcting even with cues.  Concern for reinjury or limited healing given persistent pain and deficits.  Pt tol follow up with MD but will continue with conservative protocol as pain allows.     Rehab Potential Fair   PT Frequency 2x / week   PT Duration 4 weeks   PT Treatment/Interventions Manual techniques;Vasopneumatic Device;Taping;Passive range of  motion;Patient/family education;Therapeutic exercise;Therapeutic activities;Cryotherapy;Electrical Stimulation;Moist Heat;Ultrasound   PT Next Visit Plan continue per protocol   Consulted and Agree with Plan of Care Patient      Patient will benefit from skilled therapeutic  intervention in order to improve the following deficits and impairments:  Pain, Decreased strength, Decreased mobility, Decreased range of motion, Impaired UE functional use, Postural dysfunction  Visit Diagnosis: Pain in right shoulder - Plan: PT plan of care cert/re-cert  Stiffness of right shoulder, not elsewhere classified - Plan: PT plan of care cert/re-cert     Problem List Patient Active Problem List   Diagnosis Date Noted  . Preop cardiovascular exam 07/09/2015  . Chest pain 05/09/2013  . Hypertension 02/23/2012  . Hyperlipidemia 02/23/2012  . Right shoulder pain 01/25/2012  . Left wrist pain 01/16/2012  . Neck pain 12/26/2011   Laureen Abrahams, PT, DPT 10/13/2015 1:18 PM  [facility] [address]  [place], [place], [address] Phone: [phone]   Fax:  [phone]  Name: [name] [name] MRN: [id number] Date of Birth: 1946/03/05

## 2015-10-16 ENCOUNTER — Ambulatory Visit: Payer: Medicare Other | Admitting: Physical Therapy

## 2015-10-16 DIAGNOSIS — M25511 Pain in right shoulder: Secondary | ICD-10-CM

## 2015-10-16 DIAGNOSIS — M25611 Stiffness of right shoulder, not elsewhere classified: Secondary | ICD-10-CM

## 2015-10-16 NOTE — Therapy (Signed)
[facility] [address]  [place], [place], [address] Phone: [phone]   Fax:  [phone]  Physical Therapy Treatment  Patient Details  Name: [name] [name] MRN: [id number] Date of Birth: Jan 20, 1946 Referring Provider: Kathryne Hitch, MD  Encounter Date: 10/16/2015      PT End of Session - 10/16/15 1105    Visit Number 19   Number of Visits 26   Date for PT Re-Evaluation 11/10/15   PT Start Time 1029   PT Stop Time 1119   PT Time Calculation (min) 50 min   Activity Tolerance Patient tolerated treatment well;No increased pain   Behavior During Therapy [facility] for tasks assessed/performed      Past Medical History  Diagnosis Date  . Allergy   . Hypertension   . Hyperlipidemia   . IBS (irritable bowel syndrome)   . Arthritis   . Full dentures   . Wears glasses     readers  . PONV (postoperative nausea and vomiting)   . Asthma     Bronchial   . Headache     migraines  . Celiac disease     Past Surgical History  Procedure Laterality Date  . Shoulder surgery      left  . Elbow surgery    . Knee surgery      lt  . Carpal tunnel release      rt  . Varicose vein surgery    . Nasal septum surgery    . Abdominal hysterectomy    . Cholecystectomy    . Appendectomy    . Tonsillectomy and adenoidectomy    . Breast lumpectomy  1985    lt  . Eye surgery      both cataracts  . Wrist arthroscopy Right 09/20/2013    Procedure: RIGHT ARTHROSCOPY WRIST EXCISION PISIFORM;  Surgeon: Cammie Sickle, MD;  Location: [place];  Service: Orthopedics;  Laterality: Right;  . Shoulder arthroscopy with bicepstenotomy Right 05/09/2014    Procedure: SHOULDER ARTHROSCOPY WITH BICEPSTENOTOMY;  Surgeon: Ninetta Lights, MD;  Location: [place];  Service: Orthopedics;  Laterality: Right;  Debridement Rotator Cuff Tear  . Shoulder acromioplasty Right 05/09/2014    Procedure: SHOULDER  ACROMIOPLASTY;  Surgeon: Ninetta Lights, MD;  Location: [place];  Service: Orthopedics;  Laterality: Right;  . Shoulder arthroscopy with rotator cuff repair Right 12/19/2014    Procedure: RIGHT SHOULDER ARTHROSCOPY WITH ROTATOR CUFF REPAIR,CALCIFIC DEBRIDEMENT EXTENSIVE;  Surgeon: Ninetta Lights, MD;  Location: [place];  Service: Orthopedics;  Laterality: Right;  . Shoulder arthroscopy with rotator cuff repair Right 07/10/2015    Procedure: RIGHT SHOULDER ARTHROSCOPY, DEBRIDEMENT  WITH ROTATOR CUFF REPAIR;  Surgeon: Ninetta Lights, MD;  Location: [place];  Service: Orthopedics;  Laterality: Right;    There were no vitals filed for this visit.      Subjective Assessment - 10/16/15 1028    Subjective shoulder is "the same."   Patient Stated Goals "get back to normal"   Currently in Pain? Yes   Pain Score 5    Pain Location Shoulder   Pain Orientation Right   Pain Descriptors / Indicators Aching;Constant   Pain Type Surgical pain   Pain Onset More than a month ago   Pain Frequency Constant   Aggravating Factors  overuse   Pain Relieving Factors meds  [place] Adult PT Treatment/Exercise - 10/16/15 1037    Shoulder Exercises: Prone   Retraction Right;15 reps;Weights   Retraction Weight (lbs) 1   Extension Right;15 reps;Limitations   Extension Limitations 1# to neutral   Horizontal ABduction 1 Right;15 reps   Shoulder Exercises: Sidelying   External Rotation AROM;Right;15 reps   External Rotation Weight (lbs) 1   Flexion Right;15 reps;Limitations   Flexion Limitations to 90 degrees   Other Sidelying Exercises R scapular retraction x 15 with 5 sec hold   Shoulder Exercises: Standing   External Rotation Right;15 reps;Theraband   Theraband Level (Shoulder External Rotation) Level 1 (Yellow)   Internal Rotation Right;15 reps;Theraband   Theraband Level (Shoulder Internal Rotation) Level 1  (Yellow)   Extension Both;15 reps;Theraband   Theraband Level (Shoulder Extension) Level 1 (Yellow)   Retraction Both;15 reps;Theraband   Theraband Level (Shoulder Retraction) Level 1 (Yellow)   Shoulder Exercises: Pulleys   Flexion 3 minutes   ABduction 3 minutes   ABduction Limitations scaption   Vasopneumatic   Number Minutes Vasopneumatic  15 minutes   Vasopnuematic Location  Shoulder   Vasopneumatic Pressure Low   Vasopneumatic Temperature  32                  PT Short Term Goals - 09/05/15 1109    PT SHORT TERM GOAL #1   Title pt able to progress to AAROM exercises by 09/09/15   Status Achieved   PT SHORT TERM GOAL #2   Title R Shoulder PROM Flexion to 140 and ER to 60 at 80 ABD by 09/09/15  ER goal met at 45ish ABD   Status Partially Met           PT Long Term Goals - 10/13/15 0936    PT LONG TERM GOAL #1   Title independent with advanced HEP as necessary by 11/10/15   Status On-going   PT LONG TERM GOAL #2   Title R shoulder AROM WFL and MMT 4/5 or better all planes by 11/10/15   Status On-going   PT LONG TERM GOAL #3   Title pt able to perform all ADLs and chores without limitation by R shoulder pain or weakness by 11/10/15   PT LONG TERM GOAL #4   Title n/a   PT LONG TERM GOAL #5   Title n/a               Plan - 10/16/15 1105    Clinical Impression Statement Pt tolerated increased strengthening exercises today with no increased pain; only reports of muscle fatigue.  Plan to see MD next week to assess progress.  Will continue to benefit from PT to maximize function.   PT Next Visit Plan continue per protocol   Consulted and Agree with Plan of Care Patient      Patient will benefit from skilled therapeutic intervention in order to improve the following deficits and impairments:     Visit Diagnosis: Pain in right shoulder  Stiffness of right shoulder, not elsewhere classified     Problem List Patient Active Problem List   Diagnosis  Date Noted  . Preop cardiovascular exam 07/09/2015  . Chest pain 05/09/2013  . Hypertension 02/23/2012  . Hyperlipidemia 02/23/2012  . Right shoulder pain 01/25/2012  . Left wrist pain 01/16/2012  . Neck pain 12/26/2011   Laureen Abrahams, PT, DPT 10/16/2015 11:20 AM  [facility] [address]  [address] [place], [place],  [place] Phone: [phone]   Fax:  [phone]  Name: [name] [name] MRN: [id number] Date of Birth: 09-14-1945

## 2015-10-20 ENCOUNTER — Ambulatory Visit: Payer: Medicare Other | Admitting: Physical Therapy

## 2015-10-20 DIAGNOSIS — M25511 Pain in right shoulder: Secondary | ICD-10-CM

## 2015-10-20 DIAGNOSIS — M25611 Stiffness of right shoulder, not elsewhere classified: Secondary | ICD-10-CM

## 2015-10-20 NOTE — Therapy (Addendum)
[facility] [address]  [address] [place], [place], [address] Phone: [phone]   Fax:  [phone]  Physical Therapy Treatment  Patient Details  Name: [name] [name] MRN: [id number] Date of Birth: 1946-02-22 Referring Provider: Mckinley Jewel, MD  Encounter Date: 10/20/2015      PT End of Session - 10/20/15 1243    Visit Number 20   Number of Visits 26   Date for PT Re-Evaluation 11/10/15   PT Start Time 1110   PT Stop Time 1156   PT Time Calculation (min) 46 min   Activity Tolerance Patient tolerated treatment well;No increased pain   Behavior During Therapy [facility] for tasks assessed/performed      Past Medical History  Diagnosis Date  . Allergy   . Hypertension   . Hyperlipidemia   . IBS (irritable bowel syndrome)   . Arthritis   . Full dentures   . Wears glasses     readers  . PONV (postoperative nausea and vomiting)   . Asthma     Bronchial   . Headache     migraines  . Celiac disease     Past Surgical History  Procedure Laterality Date  . Shoulder surgery      left  . Elbow surgery    . Knee surgery      lt  . Carpal tunnel release      rt  . Varicose vein surgery    . Nasal septum surgery    . Abdominal hysterectomy    . Cholecystectomy    . Appendectomy    . Tonsillectomy and adenoidectomy    . Breast lumpectomy  1985    lt  . Eye surgery      both cataracts  . Wrist arthroscopy Right 09/20/2013    Procedure: RIGHT ARTHROSCOPY WRIST EXCISION PISIFORM;  Surgeon: Wyn Forster, MD;  Location: [facility];  Service: Orthopedics;  Laterality: Right;  . Shoulder arthroscopy with bicepstenotomy Right 05/09/2014    Procedure: SHOULDER ARTHROSCOPY WITH BICEPSTENOTOMY;  Surgeon: Loreta Ave, MD;  Location: [facility];  Service: Orthopedics;  Laterality: Right;  Debridement Rotator Cuff Tear  . Shoulder acromioplasty Right 05/09/2014    Procedure: SHOULDER  ACROMIOPLASTY;  Surgeon: Loreta Ave, MD;  Location: [facility];  Service: Orthopedics;  Laterality: Right;  . Shoulder arthroscopy with rotator cuff repair Right 12/19/2014    Procedure: RIGHT SHOULDER ARTHROSCOPY WITH ROTATOR CUFF REPAIR,CALCIFIC DEBRIDEMENT EXTENSIVE;  Surgeon: Loreta Ave, MD;  Location: [facility];  Service: Orthopedics;  Laterality: Right;  . Shoulder arthroscopy with rotator cuff repair Right 07/10/2015    Procedure: RIGHT SHOULDER ARTHROSCOPY, DEBRIDEMENT  WITH ROTATOR CUFF REPAIR;  Surgeon: Loreta Ave, MD;  Location: [facility];  Service: Orthopedics;  Laterality: Right;    There were no vitals filed for this visit.      Subjective Assessment - 10/20/15 1109    Subjective shoulder felt worse after PT Thursday and Friday, but has calmed down now.   Patient Stated Goals "get back to normal"   Currently in Pain? Yes   Pain Score 5    Pain Location Shoulder   Pain Orientation Right   Pain Descriptors / Indicators Aching;Constant   Pain Type Surgical pain;Chronic pain   Pain Onset More than a month ago   Pain Frequency Constant   Aggravating Factors  overuse   Pain Relieving Factors meds  [facility] PT Assessment - 10/20/15 1112    Assessment   Next MD Visit 10/21/15   Observation/Other Assessments   Focus on Therapeutic Outcomes (FOTO)  43 (57% limited)   AROM   Overall AROM Comments tested flexion and abdct in sitting   Right Shoulder Flexion 103 Degrees   Right Shoulder ABduction 85 Degrees   Right Shoulder Internal Rotation 56 Degrees   Right Shoulder External Rotation 20 Degrees   PROM   Right Shoulder Flexion 117 Degrees   Right Shoulder ABduction 120 Degrees   Right Shoulder Internal Rotation 62 Degrees   Right Shoulder External Rotation 34 Degrees                     [facility] Adult PT Treatment/Exercise - 10/20/15 1111    Shoulder Exercises: Seated   Other Seated  Exercises chair push up x 10   Shoulder Exercises: Prone   Retraction Right;15 reps;Weights   Retraction Weight (lbs) 1   Extension Right;15 reps;Limitations   Extension Limitations 1# to neutral   Horizontal ABduction 1 Right;15 reps   Horizontal ABduction 1 Weight (lbs) 1   Shoulder Exercises: Sidelying   External Rotation AROM;Right;15 reps   External Rotation Weight (lbs) 1   Flexion Right;15 reps;Limitations   Flexion Limitations 1# cuff weight   Other Sidelying Exercises R scapular retraction x 15 with 5 sec hold   Shoulder Exercises: Standing   Other Standing Exercises weight shifting on elevated mat table 10x5sec   Shoulder Exercises: Pulleys   Flexion 3 minutes   ABduction 3 minutes   ABduction Limitations scaption   Shoulder Exercises: ROM/Strengthening   Wall Pushups 10 reps;Limitations   Wall Pushups Limitations very small movement to initiate light weight bearing activities   Vasopneumatic   Number Minutes Vasopneumatic  10 minutes   Vasopnuematic Location  Shoulder   Vasopneumatic Pressure Low   Vasopneumatic Temperature  32                  PT Short Term Goals - 09/05/15 1109    PT SHORT TERM GOAL #1   Title pt able to progress to AAROM exercises by 09/09/15   Status Achieved   PT SHORT TERM GOAL #2   Title R Shoulder PROM Flexion to 140 and ER to 60 at 80 ABD by 09/09/15  ER goal met at 45ish ABD   Status Partially Met           PT Long Term Goals - 10/13/15 0936    PT LONG TERM GOAL #1   Title independent with advanced HEP as necessary by 11/10/15   Status On-going   PT LONG TERM GOAL #2   Title R shoulder AROM WFL and MMT 4/5 or better all planes by 11/10/15   Status On-going   PT LONG TERM GOAL #3   Title pt able to perform all ADLs and chores without limitation by R shoulder pain or weakness by 11/10/15   PT LONG TERM GOAL #4   Title n/a   PT LONG TERM GOAL #5   Title n/a               Plan - 10/20/15 1244    Clinical  Impression Statement Pt has demonstrated an increase in ROM, but continues to be limited by pain, weakness and ROM limitations.  Pt continues to need to use compensatory strategies for ROM with cues.  Will continue to beneift from PT as recommended by MD to improve  function.   PT Treatment/Interventions Manual techniques;Vasopneumatic Device;Taping;Passive range of motion;Patient/family education;Therapeutic exercise;Therapeutic activities;Cryotherapy;Electrical Stimulation;Moist Heat;Ultrasound   PT Next Visit Plan continue per protocol, follow up on MD   Consulted and Agree with Plan of Care Patient      Patient will benefit from skilled therapeutic intervention in order to improve the following deficits and impairments:  Pain, Decreased strength, Decreased mobility, Decreased range of motion, Impaired UE functional use, Postural dysfunction  Visit Diagnosis: Pain in right shoulder  Stiffness of right shoulder, not elsewhere classified       G-Codes - Nov 08, 2015 1245    Functional Assessment Tool Used FOTO 43 (57% limited)   Functional Limitation Carrying, moving and handling objects   Carrying, Moving and Handling Objects Goal Status (V8102) At least 40 percent but less than 60 percent impaired, limited or restricted   Carrying, Moving and Handling Objects Discharge Status [phone]) At least 40 percent but less than 60 percent impaired, limited or restricted      Problem List Patient Active Problem List   Diagnosis Date Noted  . Preop cardiovascular exam 07/09/2015  . Chest pain 05/09/2013  . Hypertension 02/23/2012  . Hyperlipidemia 02/23/2012  . Right shoulder pain 01/25/2012  . Left wrist pain 01/16/2012  . Neck pain 12/26/2011   Laureen Abrahams, PT, DPT Nov 08, 2015 12:47 PM  [facility] [address]  [place], [place], [address] Phone: [phone]   Fax:  [phone]  Name: [name] [name] MRN:  [id number] Date of Birth: 1945-06-21       PHYSICAL THERAPY DISCHARGE SUMMARY  Visits from Start of Care: 20  Current functional level related to goals / functional outcomes: See above   Remaining deficits: Pt continues to have limited motion and pain - plan is for reverse total shoulder arthroplasty   Education / Equipment: HEP, posture  Plan: Patient agrees to discharge.  Patient goals were not met. Patient is being discharged due to the patient's request.  ?????     Pt requesting d/c at this time given plan for reverse total shoulder.  Laureen Abrahams, PT, DPT 11/04/15 9:54 AM  [facility] [place], [place] [address]  [phone] (office) [phone] (fax)

## 2015-10-23 ENCOUNTER — Ambulatory Visit: Payer: Medicare Other | Admitting: Physical Therapy

## 2015-10-23 DIAGNOSIS — M25611 Stiffness of right shoulder, not elsewhere classified: Secondary | ICD-10-CM

## 2015-10-23 DIAGNOSIS — M25511 Pain in right shoulder: Secondary | ICD-10-CM

## 2015-10-23 NOTE — Therapy (Signed)
[facility] [place] [address]  [address] [place], [place], [address] Phone: [phone]   Fax:  [phone]  Patient Details  Name: [name] [name] MRN: [id number] Date of Birth: 1945/10/30 Referring Provider:  Loreta AveMurphy, Daniel F, MD  Encounter Date: 10/23/2015   Pt arrived for PT session but opted to cancel appointment today given new information from MD's office.  Pt stated plan will be for reverse total shoulder when necessary (hoping it will be a few months away) but is concerned about her therapy benefits.  Requested to cancel today to look into available therapy remaining and then wants to follow up for 1-2 more weeks to update HEP to perform at home in preparation for upcoming surgery.  PT agreeable to plan.  Clarita CraneStephanie F Casia Corti, PT, DPT 10/23/2015 10:52 AM  [facility] [place] [address]  [address] [place], [place], [address] Phone: [phone]   Fax:  [phone]

## 2015-10-27 ENCOUNTER — Ambulatory Visit: Payer: Medicare Other

## 2015-10-30 ENCOUNTER — Ambulatory Visit: Payer: Medicare Other

## 2015-12-24 ENCOUNTER — Encounter: Payer: Self-pay | Admitting: *Deleted

## 2015-12-24 NOTE — Telephone Encounter (Signed)
This encounter was created in error - please disregard.

## 2015-12-25 ENCOUNTER — Telehealth: Payer: Self-pay | Admitting: *Deleted

## 2015-12-25 NOTE — Telephone Encounter (Signed)
Pt needing clearance for right total shoulder replacement, office note from 07-09-15 containing clearance faxed to the number proveded. Per dr Jens Somcrenshaw, that clearance is still valid.

## 2016-02-05 ENCOUNTER — Telehealth: Payer: Self-pay | Admitting: *Deleted

## 2016-02-05 NOTE — Telephone Encounter (Signed)
Per dr Jens Somcrenshaw, okay for patient to have right reverse total shoulder replacement faxed to the number provided. conformation of successful fax received.

## 2016-02-06 ENCOUNTER — Other Ambulatory Visit (HOSPITAL_COMMUNITY): Payer: Medicare Other

## 2016-02-06 ENCOUNTER — Encounter (HOSPITAL_COMMUNITY): Payer: Self-pay

## 2016-02-06 ENCOUNTER — Encounter (HOSPITAL_COMMUNITY)
Admission: RE | Admit: 2016-02-06 | Discharge: 2016-02-06 | Disposition: A | Discharge status: Home / Self Care | Payer: Medicare Other | Source: Ambulatory Visit | Attending: Orthopedic Surgery | Admitting: Orthopedic Surgery

## 2016-02-06 ENCOUNTER — Ambulatory Visit (HOSPITAL_COMMUNITY)
Admission: RE | Admit: 2016-02-06 | Discharge: 2016-02-06 | Disposition: A | Discharge status: Home / Self Care | Payer: Medicare Other | Source: Ambulatory Visit | Attending: Orthopedic Surgery | Admitting: Orthopedic Surgery

## 2016-02-06 DIAGNOSIS — Z01818 Encounter for other preprocedural examination: Secondary | ICD-10-CM | POA: Insufficient documentation

## 2016-02-06 HISTORY — DX: Pneumonia, unspecified organism: J18.9

## 2016-02-06 HISTORY — DX: Personal history of other diseases of the digestive system: Z87.19

## 2016-02-06 HISTORY — DX: Anxiety disorder, unspecified: F41.9

## 2016-02-06 LAB — URINALYSIS, ROUTINE W REFLEX MICROSCOPIC
BILIRUBIN URINE: NEGATIVE
GLUCOSE, UA: NEGATIVE mg/dL
KETONES UR: NEGATIVE mg/dL
Leukocytes, UA: NEGATIVE
NITRITE: NEGATIVE
PH: 5.5 (ref 5.0–8.0)
Protein, ur: NEGATIVE mg/dL
SPECIFIC GRAVITY, URINE: 1.009 (ref 1.005–1.030)

## 2016-02-06 LAB — COMPREHENSIVE METABOLIC PANEL
ALBUMIN: 3.6 g/dL (ref 3.5–5.0)
ALK PHOS: 67 U/L (ref 38–126)
ALT: 16 U/L (ref 14–54)
AST: 22 U/L (ref 15–41)
Anion gap: 8 (ref 5–15)
BILIRUBIN TOTAL: 0.8 mg/dL (ref 0.3–1.2)
BUN: 12 mg/dL (ref 6–20)
CALCIUM: 9.4 mg/dL (ref 8.9–10.3)
CO2: 25 mmol/L (ref 22–32)
CREATININE: 0.91 mg/dL (ref 0.44–1.00)
Chloride: 105 mmol/L (ref 101–111)
GFR calc Af Amer: >60 mL/min (ref >60)
GLUCOSE: 97 mg/dL (ref 65–99)
POTASSIUM: 4.2 mmol/L (ref 3.5–5.1)
Sodium: 138 mmol/L (ref 135–145)
TOTAL PROTEIN: 6.8 g/dL (ref 6.5–8.1)

## 2016-02-06 LAB — CBC WITH DIFFERENTIAL/PLATELET
BASOS ABS: 0 10*3/uL (ref 0.0–0.1)
BASOS PCT: 0 %
Eosinophils Absolute: 0.1 10*3/uL (ref 0.0–0.7)
Eosinophils Relative: 2 %
HEMATOCRIT: 40.6 % (ref 36.0–46.0)
HEMOGLOBIN: 13.7 g/dL (ref 12.0–15.0)
LYMPHS PCT: 37 %
Lymphs Abs: 2.6 10*3/uL (ref 0.7–4.0)
MCH: 32.1 pg (ref 26.0–34.0)
MCHC: 33.7 g/dL (ref 30.0–36.0)
MCV: 95.1 fL (ref 78.0–100.0)
MONO ABS: 0.5 10*3/uL (ref 0.1–1.0)
MONOS PCT: 8 %
NEUTROS ABS: 3.7 10*3/uL (ref 1.7–7.7)
NEUTROS PCT: 53 %
Platelets: 375 10*3/uL (ref 150–400)
RBC: 4.27 MIL/uL (ref 3.87–5.11)
RDW: 13.4 % (ref 11.5–15.5)
WBC: 6.9 10*3/uL (ref 4.0–10.5)

## 2016-02-06 LAB — ABO/RH: ABO/RH(D): O POS

## 2016-02-06 LAB — TYPE AND SCREEN
ABO/RH(D): O POS
Antibody Screen: NEGATIVE

## 2016-02-06 LAB — URINE MICROSCOPIC-ADD ON
BACTERIA UA: NONE SEEN
WBC UA: NONE SEEN WBC/hpf (ref 0–5)

## 2016-02-06 LAB — APTT: APTT: 30 s (ref 24–36)

## 2016-02-06 LAB — SURGICAL PCR SCREEN
MRSA, PCR: NEGATIVE
STAPHYLOCOCCUS AUREUS: NEGATIVE

## 2016-02-06 LAB — PROTIME-INR
INR: 0.9
PROTHROMBIN TIME: 12.2 s (ref 11.4–15.2)

## 2016-02-06 NOTE — H&P (Signed)
CHIEF COMPLAINT:  Painful right shoulder.   HPI:  [name] [name] is a very pleasant 70 year old white female who is seen for evaluation of her right shoulder. She has had problems with her shoulder dating back to three years ago. She has had previous arthroscopies dating back to January 1016, September 2016 and March 2017. She has had multiple surgeries and attempts at rotator cuff repair where they have not been able to fix the tear. Essentially, the last surgery they felt it was irreparable.  She has been having pain which is now somewhere between 5-8/10. She has had corticosteroid I injections and modifications, three scopes as well as anti-inflammatories as well as some pain medications with physical therapy.  At this point she is unable to function well with this. Because of the pain, including the nighttime pain as well as with activities of daily living she is seen today for a re-evaluation.  Past medical history and general health is good.   Surgeries:   Right shoulder arthroscopic debridement on 05-09-14, 12-2014, 07-10-15. Right wrist surgery  2015.  Left elbow and left shoulder surgery 2011 which was a lateral epicondylar repair.  Left knee surgery 2004. Right lateral epicondylar repair 2012.  Right carpal tunnel release 1985 Varicose veins both legs 2008 Deviated septum 35 years ago. Total abdominal hysterectomy greater than 40 years ago.  Cholecystectomy more than 40 years ago. Appendectomy 50 years ago. Tonsillectomy and adenoidectomy 45 years ago. Lump removed from left breast (non-cancerous) 20 years ago.  Medications: Benicar 20 mg daily. Premarin 1.25 mg daily. Biotin 5 mg daily.  ALLERGIES TO CODEINE WHICH CAUSES SEVERE NAUSEA AND VOMITING.  SHELLFISH AND IODINE SHE HAD ANAPHYLATIC SHOCK ALTHOUGH SHE STATES SHE DOES NOT  HAVE PROBLEMS WITH BETADINE.  SHE HAS PROBLEMS WITH IODINE CONTRAST. SHE HAS TACHYCARDIA FROM THE USE OF ANTI-INFLAMMATORIES. SHE IS ABLE TO TOLERATE AN ASPIRIN  EVERY OTHER DAY FOR TACHYCARDIA. SHE DOES GET A RASH WITH PENICILLIN AND SULFA.   Review of systems: The 14 point review of systems is positive for cataract extractions. Dental implants are noted also. Twenty years ago she had pneumonia and bronchitis. She has had hypertension for ten years. She did have phlebitis postpartum, about fifty years ago was the last time.  She has a history of bronchial asthma which she occasionally uses an inhaler for.   She has a history of headaches and migraines but does not take any treatment.   Family history:  Positive for mother who is deceased at the age of 80 from a myocardial infarction.  She had a heart attack and hypertension.  Father deceased at the age of 19 from a myocardial infarction.  She has no brothers. She has one sister who at the age of 25 has mitral valve prolapse.   Social History:  She is a 70 year old white widowed female. She is the Water engineer. She quit smoking cigarettes in 1968.  Prior to that she had a two year history of less than one pack per day. She is a social drinker. She has one glass of wine with dinner on a daily basis.    EXAMINATION: General:  She is a 70 year old white female. Well-developed, well-nourished. She is very pleasant and cooperative. She is in mild distress secondary to right shoulder pain.   HT: 5' 5 1/2"  WT:  165.8 pounds T: 98.7   P: 74 R:  Normal   BP: 172/83  Oxygen Sat:  96%.  Head: Normocephalic.  Eyes: PERRLA with Extraocular motion is intact. Ears, nose and throat:  Benign.  Chest: Good expansion.  Lungs:  Essentially clear.  Cardiac: She had a regular rhythm and rate. Normal S1 and S2. No murmur was appreciated.  Pulses:  2+ bilateral, symmetric in the upper extremity.  Neck: Supple, no carotid bruits.  Abdomen:  Scaphoid, soft and nontender. No masses palpable. Normal bowel sounds present. Genital, rectal and breast exam: Not indicated for an orthopedic evaluation. CNS:  She is  alert and oriented times three.  Cranial nerves II-XII grossly intact.  Musculoskeletal:   The right shoulder reveals about 60 degrees of forward flexion, 80 degrees of abduction with markedly painful arc. With 80 degrees of abduction I can get her to about 70-80 degrees of external rotation and 40 degrees of internal rotation. She has crepitus with range of motion about the joint.     IMPRESSION:  1. Endstage osteoarthritis of the right shoulder.  2. Irreparable right rotator cuff tear.  3. History of hypertension.  4. History of asthma.   PLAN:  At this time, we have reviewed her chart and her symptoms and we feel she is a candidate for a right reverse total shoulder replacement.   The procedure, risks and benefits of the surgery were presented and reviewed in detail. All questions were answered.    Oris DroneBrian D. Tanecia Mccay, PA-C  02/06/2016 12:07 PM

## 2016-02-06 NOTE — Progress Notes (Signed)
PCP is Dr. Quenton FetterSaumel Kelly- States she has an appointment 02-10-16 for clearance  Cardiologist is Dr. Drinda Buttsrenshaw-see note from Dr Ludwig Clarksrenshaw's office 12-24-12 and 02-05-16 Echo noted form 06-19-13 Denies ever having a  Card cath States last stress test was many years ago.

## 2016-02-06 NOTE — Pre-Procedure Instructions (Addendum)
GLANDA SPANBAUER  02/06/2016      [id number] [place], [place] - [address] [place] [address] Phone: [phone] Fax: [phone]  [facility] [address] [place], [place] - [address] [place] [address] Phone: [phone] Fax: [phone]    Your procedure is scheduled on Nov 8  Report to [facility] Admitting at 630 A.M.  Call this number if you have problems the morning of surgery:  [phone]   Remember:  Do not eat food or drink liquids after midnight.  Take these medicines the morning of surgery with A SIP OF WATER Tylenol if needed, Premarin  Stop taking aspirin, Ibuprofen, Advil, Motrin, BC's, Goody's, Herbal medications, Fish Oil, Vitamins   Do not wear jewelry, make-up or nail polish.  Do not wear lotions, powders, or perfumes, or deoderant.  Do not shave 48 hours prior to surgery.  Men may shave face and neck.  Do not bring valuables to the hospital.  [facility] is not responsible for any belongings or valuables.  Contacts, dentures or bridgework may not be worn into surgery.  Leave your suitcase in the car.  After surgery it may be brought to your room.  For patients admitted to the hospital, discharge time will be determined by your treatment team.  Patients discharged the day of surgery will not be allowed to drive home.    Special instructions:[facility] - Preparing for Surgery  Before surgery, you can play an important role.  Because skin is not sterile, your skin needs to be as free of germs as possible.  You can reduce the number of germs on you skin by washing with CHG (chlorahexidine gluconate) soap before surgery.  CHG is an antiseptic cleaner which kills germs and bonds with the skin to continue killing germs even after washing.  Please DO NOT use if you have an allergy to CHG or antibacterial soaps.  If your skin becomes reddened/irritated stop using the  CHG and inform your nurse when you arrive at Short Stay.  Do not shave (including legs and underarms) for at least 48 hours prior to the first CHG shower.  You may shave your face.  Please follow these instructions carefully:   1.  Shower with CHG Soap the night before surgery and the    morning of Surgery.  2.  If you choose to wash your hair, wash your hair first as usual with your       normal shampoo.  3.  After you shampoo, rinse your hair and body thoroughly to remove the   Shampoo.  4.  Use CHG as you would any other liquid soap.  You can apply chg directly       to the skin and wash gently with scrungie or a clean washcloth.  5.  Apply the CHG Soap to your body ONLY FROM THE NECK DOWN.        Do not use on open wounds or open sores.  Avoid contact with your eyes,       ears, mouth and genitals (private parts).  Wash genitals (private parts)       with your normal soap.  6.  Wash thoroughly, paying special attention to the area where your surgery        will be performed.  7.  Thoroughly rinse your body with warm water from the neck down.  8.  DO NOT shower/wash with your normal soap after using and rinsing off       the CHG Soap.  9.  Pat yourself dry with a clean towel.            10.  Wear clean pajamas.            11.  Place clean sheets on your bed the night of your first shower and do not        sleep with pets.  Day of Surgery  Do not apply any lotions/deoderants the morning of surgery.  Please wear clean clothes to the hospital/surgery center.     Please read over the following fact sheets that you were given. Pain Booklet, Coughing and Deep Breathing, MRSA Information and Surgical Site Infection Prevention, Incentive Spirometry

## 2016-02-07 LAB — URINE CULTURE

## 2016-02-10 NOTE — Progress Notes (Signed)
Anesthesia Chart Review: Patient is a 70 year old female scheduled for right reverse total shoulder replacement on 02/18/2016 by Dr. [name].  History includes non-smoker, post-operative N/V, HTN, HLD, IBS, migraines, asthma, celiac disease, hiatal hernia, anxiety, varicose vein surgery, cholecystectomy, hysterectomy, nasal sinus surgery, appendectomy, T&A, right shoulder acromioplasty 05/09/14, right shoulder rotator cuff 12/19/14 with debridement 07/10/15.   BP (!) 147/85   Pulse 79   Temp 36.4 C   Resp 20   Ht 5' 5.5" (1.664 m)   Wt 165 lb 6.4 oz (75 kg)   SpO2 96%   BMI 27.11 kg/m   Meds include aspirin 81 mg (on hold), biotin, Benicar, Premarin, Resveratrol (natural phenol supplement), doxycycline PRN rosacea, olive leaf extract.   - PCP is Dr. Jenita SeashoreSamuel Kelly [facility], see Care Everywhere). Last visit 02/10/16 with Deneen HartsElizabeth Todd, FNP (office note not yet viewable in Care Everywhere).  - Cardiologist is Dr. Olga MillersBrian Crenshaw, last visit on 07/09/15 for preoperative evaluation (had rotator cuff debridement 07/10/15).  Dr. Jens Somrenshaw also cleared for this procedure (see 02/05/16 telephone encounter by Betsey HolidayMathis, RN).    06/19/13 Echo: Study Conclusions - Left ventricle: The cavity size was normal. There was mild focal basal hypertrophy of the septum. Systolic function was normal. The estimated ejection fraction was in the range of 55% to 60%. Wall motion was normal; there were no regional wall motion abnormalities. Doppler parameters are consistent with abnormal left ventricular relaxation (grade 1 diastolic dysfunction). - Mitral valve: Mild regurgitation.  05/16/07 Exercise Myocardial Perfusion: Negative adequate exercise EKG except for a hypertensive BP response of 210/104. There was normal myocardial perfusion. There was no evidence of significant ischemia noted.   02/06/16 CXR; IMPRESSION: No active cardiopulmonary disease.  Preoperative labs noted. Urine  culture showed multiple species, suggest recollection.   If no acute changes then I anticipate that she can proceed as planned.   Velna Ochsllison Sally-Ann Cutbirth, PA-C [facility] Phone [phone] 02/10/2016 1:24 PM

## 2016-02-17 NOTE — Anesthesia Preprocedure Evaluation (Addendum)
Anesthesia Evaluation  Patient identified by MRN, date of birth, ID band Patient awake    Reviewed: Allergy & Precautions, NPO status , Patient's Chart, lab work & pertinent test results  History of Anesthesia Complications (+) PONV and history of anesthetic complications  Airway Mallampati: II  TM Distance: >3 FB Neck ROM: Full    Dental  (+) Dental Advisory Given, Lower Dentures, Upper Dentures   Pulmonary asthma ,    Pulmonary exam normal breath sounds clear to auscultation       Cardiovascular hypertension, Pt. on medications Normal cardiovascular exam Rhythm:Regular Rate:Normal     Neuro/Psych  Headaches, PSYCHIATRIC DISORDERS Anxiety    GI/Hepatic Neg liver ROS, hiatal hernia,   Endo/Other  negative endocrine ROS  Renal/GU negative Renal ROS     Musculoskeletal  (+) Arthritis , Osteoarthritis,    Abdominal   Peds  Hematology negative hematology ROS (+)   Anesthesia Other Findings Day of surgery medications reviewed with the patient.  Reproductive/Obstetrics                            Anesthesia Physical Anesthesia Plan  ASA: II  Anesthesia Plan: General   Post-op Pain Management: GA combined w/ Regional for post-op pain   Induction: Intravenous  Airway Management Planned: Oral ETT  Additional Equipment:   Intra-op Plan:   Post-operative Plan: Extubation in OR  Informed Consent: I have reviewed the patients History and Physical, chart, labs and discussed the procedure including the risks, benefits and alternatives for the proposed anesthesia with the patient or authorized representative who has indicated his/her understanding and acceptance.   Dental advisory given  Plan Discussed with: CRNA  Anesthesia Plan Comments: (Risks/benefits of general anesthesia discussed with patient including risk of damage to teeth, lips, gum, and tongue, nausea/vomiting, allergic  reactions to medications, and the possibility of heart attack, stroke and death.  All patient questions answered.  Patient wishes to proceed.  Discussed risks and benefits of interscalene block including failure, bleeding, infection, nerve damage, weakness, shortness of breath, pneumothorax. Questions answered. Patient consents to block. )        Anesthesia Quick Evaluation

## 2016-02-18 ENCOUNTER — Inpatient Hospital Stay (HOSPITAL_COMMUNITY)
Admission: RE | Admit: 2016-02-18 | Discharge: 2016-02-20 | DRG: 483 | Disposition: A | Discharge status: Home / Self Care | Payer: Medicare Other | Source: Ambulatory Visit | Attending: Orthopedic Surgery | Admitting: Orthopedic Surgery

## 2016-02-18 ENCOUNTER — Encounter (HOSPITAL_COMMUNITY)
Admission: RE | Disposition: A | Discharge status: Home / Self Care | Payer: Self-pay | Source: Ambulatory Visit | Attending: Orthopedic Surgery

## 2016-02-18 ENCOUNTER — Inpatient Hospital Stay (HOSPITAL_COMMUNITY): Payer: Medicare Other | Admitting: Anesthesiology

## 2016-02-18 ENCOUNTER — Encounter (HOSPITAL_COMMUNITY): Payer: Self-pay | Admitting: Urology

## 2016-02-18 ENCOUNTER — Inpatient Hospital Stay (HOSPITAL_COMMUNITY): Payer: Medicare Other

## 2016-02-18 DIAGNOSIS — Z882 Allergy status to sulfonamides status: Secondary | ICD-10-CM

## 2016-02-18 DIAGNOSIS — K449 Diaphragmatic hernia without obstruction or gangrene: Secondary | ICD-10-CM | POA: Diagnosis present

## 2016-02-18 DIAGNOSIS — Z91041 Radiographic dye allergy status: Secondary | ICD-10-CM | POA: Diagnosis not present

## 2016-02-18 DIAGNOSIS — I10 Essential (primary) hypertension: Secondary | ICD-10-CM | POA: Diagnosis present

## 2016-02-18 DIAGNOSIS — Z888 Allergy status to other drugs, medicaments and biological substances status: Secondary | ICD-10-CM

## 2016-02-18 DIAGNOSIS — M25511 Pain in right shoulder: Secondary | ICD-10-CM | POA: Diagnosis present

## 2016-02-18 DIAGNOSIS — Z9849 Cataract extraction status, unspecified eye: Secondary | ICD-10-CM

## 2016-02-18 DIAGNOSIS — M19011 Primary osteoarthritis, right shoulder: Principal | ICD-10-CM | POA: Diagnosis present

## 2016-02-18 DIAGNOSIS — Z79899 Other long term (current) drug therapy: Secondary | ICD-10-CM | POA: Diagnosis not present

## 2016-02-18 DIAGNOSIS — Z8249 Family history of ischemic heart disease and other diseases of the circulatory system: Secondary | ICD-10-CM | POA: Diagnosis not present

## 2016-02-18 DIAGNOSIS — Z972 Presence of dental prosthetic device (complete) (partial): Secondary | ICD-10-CM

## 2016-02-18 DIAGNOSIS — Z885 Allergy status to narcotic agent status: Secondary | ICD-10-CM | POA: Diagnosis not present

## 2016-02-18 DIAGNOSIS — Z9049 Acquired absence of other specified parts of digestive tract: Secondary | ICD-10-CM | POA: Diagnosis not present

## 2016-02-18 DIAGNOSIS — J45909 Unspecified asthma, uncomplicated: Secondary | ICD-10-CM | POA: Diagnosis present

## 2016-02-18 DIAGNOSIS — Z79890 Hormone replacement therapy (postmenopausal): Secondary | ICD-10-CM

## 2016-02-18 DIAGNOSIS — E785 Hyperlipidemia, unspecified: Secondary | ICD-10-CM | POA: Diagnosis present

## 2016-02-18 DIAGNOSIS — M79609 Pain in unspecified limb: Secondary | ICD-10-CM | POA: Diagnosis not present

## 2016-02-18 DIAGNOSIS — Z880 Allergy status to penicillin: Secondary | ICD-10-CM | POA: Diagnosis not present

## 2016-02-18 DIAGNOSIS — F419 Anxiety disorder, unspecified: Secondary | ICD-10-CM | POA: Diagnosis present

## 2016-02-18 DIAGNOSIS — Z973 Presence of spectacles and contact lenses: Secondary | ICD-10-CM | POA: Diagnosis not present

## 2016-02-18 DIAGNOSIS — Z91013 Allergy to seafood: Secondary | ICD-10-CM

## 2016-02-18 DIAGNOSIS — Z87891 Personal history of nicotine dependence: Secondary | ICD-10-CM | POA: Diagnosis not present

## 2016-02-18 DIAGNOSIS — K900 Celiac disease: Secondary | ICD-10-CM | POA: Diagnosis present

## 2016-02-18 DIAGNOSIS — M75101 Unspecified rotator cuff tear or rupture of right shoulder, not specified as traumatic: Secondary | ICD-10-CM | POA: Diagnosis present

## 2016-02-18 DIAGNOSIS — Z90710 Acquired absence of both cervix and uterus: Secondary | ICD-10-CM

## 2016-02-18 DIAGNOSIS — Z96619 Presence of unspecified artificial shoulder joint: Secondary | ICD-10-CM

## 2016-02-18 HISTORY — PX: REVERSE TOTAL SHOULDER ARTHROPLASTY: SHX2344

## 2016-02-18 HISTORY — PX: REVERSE SHOULDER ARTHROPLASTY: SHX5054

## 2016-02-18 SURGERY — ARTHROPLASTY, SHOULDER, TOTAL, REVERSE
Anesthesia: General | Site: Shoulder | Laterality: Right

## 2016-02-18 MED ORDER — FENTANYL CITRATE (PF) 100 MCG/2ML IJ SOLN
25.0000 ug | INTRAMUSCULAR | Status: DC | PRN
  Administered 2016-02-18 (×2): 50 ug via INTRAVENOUS

## 2016-02-18 MED ORDER — HYDROMORPHONE HCL 2 MG PO TABS
2.0000 mg | ORAL_TABLET | ORAL | Status: DC | PRN
  Administered 2016-02-18 (×2): 2 mg via ORAL
  Administered 2016-02-19 (×2): 4 mg via ORAL
  Administered 2016-02-19: 2 mg via ORAL
  Administered 2016-02-19 – 2016-02-20 (×7): 4 mg via ORAL
  Filled 2016-02-18 (×4): qty 2
  Filled 2016-02-18 (×2): qty 1
  Filled 2016-02-18 (×5): qty 2
  Filled 2016-02-18: qty 1

## 2016-02-18 MED ORDER — CHLORHEXIDINE GLUCONATE 4 % EX LIQD: 60.0000 mL | Freq: Once | CUTANEOUS | Status: DC

## 2016-02-18 MED ORDER — ONDANSETRON HCL 4 MG PO TABS: 4.0000 mg | ORAL_TABLET | Freq: Four times a day (QID) | ORAL | Status: DC | PRN

## 2016-02-18 MED ORDER — POLYETHYLENE GLYCOL 3350 17 G PO PACK: 17.0000 g | PACK | Freq: Every day | ORAL | Status: DC | PRN

## 2016-02-18 MED ORDER — IRBESARTAN 300 MG PO TABS
300.0000 mg | ORAL_TABLET | Freq: Every day | ORAL | Status: DC
  Filled 2016-02-18 (×4): qty 1

## 2016-02-18 MED ORDER — SODIUM CHLORIDE 0.9 % IV SOLN
1000.0000 mg | INTRAVENOUS | Status: DC
  Filled 2016-02-18: qty 10

## 2016-02-18 MED ORDER — VANCOMYCIN HCL IN DEXTROSE 1-5 GM/200ML-% IV SOLN
1000.0000 mg | INTRAVENOUS | Status: AC
  Administered 2016-02-18: 1000 mg via INTRAVENOUS
  Filled 2016-02-18: qty 200

## 2016-02-18 MED ORDER — METOCLOPRAMIDE HCL 5 MG/ML IJ SOLN: 5.0000 mg | Freq: Three times a day (TID) | INTRAMUSCULAR | Status: DC | PRN

## 2016-02-18 MED ORDER — DIAZEPAM 2 MG PO TABS
2.0000 mg | ORAL_TABLET | Freq: Three times a day (TID) | ORAL | Status: DC | PRN
  Administered 2016-02-18 – 2016-02-20 (×4): 2 mg via ORAL
  Filled 2016-02-18 (×4): qty 1

## 2016-02-18 MED ORDER — PROPOFOL 10 MG/ML IV BOLUS
INTRAVENOUS | Status: AC
  Filled 2016-02-18: qty 20

## 2016-02-18 MED ORDER — BUPIVACAINE HCL (PF) 0.25 % IJ SOLN
INTRAMUSCULAR | Status: AC
  Filled 2016-02-18: qty 30

## 2016-02-18 MED ORDER — HYDROMORPHONE HCL 2 MG PO TABS: ORAL_TABLET | ORAL | 0 refills | Status: DC

## 2016-02-18 MED ORDER — LIDOCAINE-EPINEPHRINE (PF) 1 %-1:200000 IJ SOLN
INTRAMUSCULAR | Status: AC
  Filled 2016-02-18: qty 30

## 2016-02-18 MED ORDER — FENTANYL CITRATE (PF) 100 MCG/2ML IJ SOLN
INTRAMUSCULAR | Status: DC | PRN
  Administered 2016-02-18: 25 ug via INTRAVENOUS
  Administered 2016-02-18 (×2): 50 ug via INTRAVENOUS
  Administered 2016-02-18: 25 ug via INTRAVENOUS
  Administered 2016-02-18: 50 ug via INTRAVENOUS

## 2016-02-18 MED ORDER — ONDANSETRON HCL 4 MG/2ML IJ SOLN
INTRAMUSCULAR | Status: AC
  Administered 2016-02-18: 4 mg via INTRAVENOUS
  Filled 2016-02-18: qty 2

## 2016-02-18 MED ORDER — ONDANSETRON HCL 4 MG PO TABS: 4.0000 mg | ORAL_TABLET | Freq: Three times a day (TID) | ORAL | 0 refills | Status: DC | PRN

## 2016-02-18 MED ORDER — PROPOFOL 10 MG/ML IV BOLUS
INTRAVENOUS | Status: DC | PRN
  Administered 2016-02-18: 100 mg via INTRAVENOUS

## 2016-02-18 MED ORDER — MENTHOL 3 MG MT LOZG: 1.0000 | LOZENGE | OROMUCOSAL | Status: DC | PRN

## 2016-02-18 MED ORDER — LIDOCAINE HCL (CARDIAC) 20 MG/ML IV SOLN
INTRAVENOUS | Status: DC | PRN
  Administered 2016-02-18: 60 mg via INTRAVENOUS

## 2016-02-18 MED ORDER — ESTROGENS CONJUGATED 1.25 MG PO TABS
1.2500 mg | ORAL_TABLET | Freq: Every day | ORAL | Status: DC
  Administered 2016-02-20: 1.25 mg via ORAL
  Filled 2016-02-18 (×4): qty 1

## 2016-02-18 MED ORDER — FENTANYL CITRATE (PF) 100 MCG/2ML IJ SOLN
INTRAMUSCULAR | Status: AC
  Administered 2016-02-18: 50 ug via INTRAVENOUS
  Filled 2016-02-18: qty 2

## 2016-02-18 MED ORDER — ONDANSETRON HCL 4 MG/2ML IJ SOLN
INTRAMUSCULAR | Status: DC | PRN
  Administered 2016-02-18: 4 mg via INTRAVENOUS

## 2016-02-18 MED ORDER — DIPHENHYDRAMINE HCL 12.5 MG/5ML PO ELIX: 12.5000 mg | ORAL_SOLUTION | ORAL | Status: DC | PRN

## 2016-02-18 MED ORDER — ACETAMINOPHEN 325 MG PO TABS: 650.0000 mg | ORAL_TABLET | Freq: Four times a day (QID) | ORAL | Status: DC | PRN

## 2016-02-18 MED ORDER — MIDAZOLAM HCL 5 MG/5ML IJ SOLN
INTRAMUSCULAR | Status: DC | PRN
  Administered 2016-02-18 (×2): 1 mg via INTRAVENOUS

## 2016-02-18 MED ORDER — BISACODYL 10 MG RE SUPP: 10.0000 mg | Freq: Every day | RECTAL | Status: DC | PRN

## 2016-02-18 MED ORDER — SODIUM CHLORIDE 0.9 % IV SOLN
INTRAVENOUS | Status: DC | PRN
  Administered 2016-02-18: 40 ug/min via INTRAVENOUS

## 2016-02-18 MED ORDER — LACTATED RINGERS IV SOLN
INTRAVENOUS | Status: DC | PRN
  Administered 2016-02-18: 08:00:00 via INTRAVENOUS

## 2016-02-18 MED ORDER — DIAZEPAM 2 MG PO TABS: 2.0000 mg | ORAL_TABLET | Freq: Three times a day (TID) | ORAL | 0 refills | Status: DC | PRN

## 2016-02-18 MED ORDER — ONDANSETRON HCL 4 MG/2ML IJ SOLN
4.0000 mg | Freq: Four times a day (QID) | INTRAMUSCULAR | Status: DC | PRN
  Administered 2016-02-18: 4 mg via INTRAVENOUS
  Filled 2016-02-18: qty 2

## 2016-02-18 MED ORDER — MAGNESIUM CITRATE PO SOLN: 1.0000 | Freq: Once | ORAL | Status: DC | PRN

## 2016-02-18 MED ORDER — HYDROMORPHONE HCL 2 MG/ML IJ SOLN: 0.5000 mg | INTRAMUSCULAR | Status: DC | PRN

## 2016-02-18 MED ORDER — SENNA 8.6 MG PO TABS
1.0000 | ORAL_TABLET | Freq: Two times a day (BID) | ORAL | Status: DC
  Filled 2016-02-18 (×4): qty 1

## 2016-02-18 MED ORDER — ZOLPIDEM TARTRATE 5 MG PO TABS: 5.0000 mg | ORAL_TABLET | Freq: Every evening | ORAL | Status: DC | PRN

## 2016-02-18 MED ORDER — METOCLOPRAMIDE HCL 5 MG PO TABS: 5.0000 mg | ORAL_TABLET | Freq: Three times a day (TID) | ORAL | Status: DC | PRN

## 2016-02-18 MED ORDER — ROPIVACAINE HCL 7.5 MG/ML IJ SOLN
INTRAMUSCULAR | Status: DC | PRN
  Administered 2016-02-18: 20 mL via PERINEURAL

## 2016-02-18 MED ORDER — SUCCINYLCHOLINE CHLORIDE 20 MG/ML IJ SOLN
INTRAMUSCULAR | Status: DC | PRN
  Administered 2016-02-18: 100 mg via INTRAVENOUS

## 2016-02-18 MED ORDER — MIDAZOLAM HCL 2 MG/2ML IJ SOLN
INTRAMUSCULAR | Status: AC
  Filled 2016-02-18: qty 2

## 2016-02-18 MED ORDER — VANCOMYCIN HCL IN DEXTROSE 1-5 GM/200ML-% IV SOLN
1000.0000 mg | Freq: Two times a day (BID) | INTRAVENOUS | Status: AC
  Administered 2016-02-18: 1000 mg via INTRAVENOUS
  Filled 2016-02-18: qty 200

## 2016-02-18 MED ORDER — POTASSIUM CHLORIDE IN NACL 20-0.9 MEQ/L-% IV SOLN
INTRAVENOUS | Status: DC
  Administered 2016-02-18: 17:00:00 via INTRAVENOUS
  Filled 2016-02-18: qty 1000

## 2016-02-18 MED ORDER — ASPIRIN EC 81 MG PO TBEC
81.0000 mg | DELAYED_RELEASE_TABLET | ORAL | Status: DC
  Administered 2016-02-19: 81 mg via ORAL
  Filled 2016-02-18: qty 1

## 2016-02-18 MED ORDER — FENTANYL CITRATE (PF) 100 MCG/2ML IJ SOLN
INTRAMUSCULAR | Status: AC
  Filled 2016-02-18: qty 2

## 2016-02-18 MED ORDER — SODIUM CHLORIDE 0.9 % IV SOLN
75.0000 mL | INTRAVENOUS | Status: DC
Start: 2016-02-18 — End: 2016-02-18

## 2016-02-18 MED ORDER — DEXAMETHASONE SODIUM PHOSPHATE 10 MG/ML IJ SOLN
INTRAMUSCULAR | Status: DC | PRN
  Administered 2016-02-18: 10 mg via INTRAVENOUS

## 2016-02-18 MED ORDER — ONDANSETRON HCL 4 MG/2ML IJ SOLN
4.0000 mg | Freq: Once | INTRAMUSCULAR | Status: AC | PRN
  Administered 2016-02-18: 4 mg via INTRAVENOUS

## 2016-02-18 MED ORDER — PHENOL 1.4 % MT LIQD
1.0000 | OROMUCOSAL | Status: DC | PRN
Start: 2016-02-18 — End: 2016-02-20

## 2016-02-18 MED ORDER — ACETAMINOPHEN 650 MG RE SUPP: 650.0000 mg | Freq: Four times a day (QID) | RECTAL | Status: DC | PRN

## 2016-02-18 MED ORDER — 0.9 % SODIUM CHLORIDE (POUR BTL) OPTIME
TOPICAL | Status: DC | PRN
  Administered 2016-02-18: 1000 mL

## 2016-02-18 SURGICAL SUPPLY — 62 items
APL SKNCLS STERI-STRIP NONHPOA (GAUZE/BANDAGES/DRESSINGS) ×1
BENZOIN TINCTURE PRP APPL 2/3 (GAUZE/BANDAGES/DRESSINGS) ×3 IMPLANT
BIT DRILL 3.1 DIA REUNION (BIT) ×2 IMPLANT
BIT DRILL 3.1MM DIA REUNION (BIT) ×1
BLADE SAW SGTL 83.5X18.5 (BLADE) ×3 IMPLANT
BOWL SMART MIX CTS (DISPOSABLE) IMPLANT
BRUSH FEMORAL CANAL (MISCELLANEOUS) IMPLANT
BUR SURG 4X8 MED (BURR) IMPLANT
BURR SURG 4MMX8MM MEDIUM (BURR)
BURR SURG 4X8 MED (BURR)
CAPT SHLDR REVTOTAL 2 ×3 IMPLANT
CLOSURE STERI-STRIP 1/2X4 (GAUZE/BANDAGES/DRESSINGS) ×1
CLOSURE WOUND 1/2 X4 (GAUZE/BANDAGES/DRESSINGS) ×1
CLSR STERI-STRIP ANTIMIC 1/2X4 (GAUZE/BANDAGES/DRESSINGS) ×2 IMPLANT
COVER SURGICAL LIGHT HANDLE (MISCELLANEOUS) ×3 IMPLANT
DRAPE IMP U-DRAPE 54X76 (DRAPES) ×3 IMPLANT
DRAPE ORTHO SPLIT 77X108 STRL (DRAPES) ×6
DRAPE SURG ORHT 6 SPLT 77X108 (DRAPES) ×2 IMPLANT
DRAPE U-SHAPE 47X51 STRL (DRAPES) ×3 IMPLANT
DRSG AQUACEL AG ADV 3.5X 6 (GAUZE/BANDAGES/DRESSINGS) ×3 IMPLANT
DRSG AQUACEL AG ADV 3.5X10 (GAUZE/BANDAGES/DRESSINGS) ×3 IMPLANT
DURAPREP 26ML APPLICATOR (WOUND CARE) ×3 IMPLANT
ELECT CAUTERY BLADE 6.4 (BLADE) ×3 IMPLANT
ELECT REM PT RETURN 9FT ADLT (ELECTROSURGICAL) ×3
ELECTRODE REM PT RTRN 9FT ADLT (ELECTROSURGICAL) ×1 IMPLANT
EVACUATOR 1/8 PVC DRAIN (DRAIN) IMPLANT
FACESHIELD WRAPAROUND (MASK) ×6 IMPLANT
GLOVE BIOGEL PI IND STRL 7.0 (GLOVE) ×1 IMPLANT
GLOVE BIOGEL PI INDICATOR 7.0 (GLOVE) ×2
GLOVE ECLIPSE 7.0 STRL STRAW (GLOVE) ×3 IMPLANT
GLOVE ORTHO TXT STRL SZ7.5 (GLOVE) ×3 IMPLANT
GOWN STRL REUS W/ TWL LRG LVL3 (GOWN DISPOSABLE) ×2 IMPLANT
GOWN STRL REUS W/TWL LRG LVL3 (GOWN DISPOSABLE) ×6
HANDPIECE INTERPULSE COAX TIP (DISPOSABLE)
KIT BASIN OR (CUSTOM PROCEDURE TRAY) ×3 IMPLANT
KIT ROOM TURNOVER OR (KITS) ×3 IMPLANT
MANIFOLD NEPTUNE II (INSTRUMENTS) ×3 IMPLANT
NEEDLE HYPO 25GX1X1/2 BEV (NEEDLE) ×3 IMPLANT
NS IRRIG 1000ML POUR BTL (IV SOLUTION) ×3 IMPLANT
PACK SHOULDER (CUSTOM PROCEDURE TRAY) ×3 IMPLANT
PAD ARMBOARD 7.5X6 YLW CONV (MISCELLANEOUS) ×6 IMPLANT
SET HNDPC FAN SPRY TIP SCT (DISPOSABLE) IMPLANT
SHOULDER CAPITATED REVTOTAL 2 ×1 IMPLANT
SLING ARM IMMOBILIZER LRG (SOFTGOODS) IMPLANT
SLING ARM IMMOBILIZER MED (SOFTGOODS) ×3 IMPLANT
SLING ARM IMMOBILIZER XL (CAST SUPPLIES) IMPLANT
SPONGE LAP 18X18 X RAY DECT (DISPOSABLE) ×3 IMPLANT
STRIP CLOSURE SKIN 1/2X4 (GAUZE/BANDAGES/DRESSINGS) ×2 IMPLANT
SUCTION FRAZIER HANDLE 10FR (MISCELLANEOUS) ×2
SUCTION TUBE FRAZIER 10FR DISP (MISCELLANEOUS) ×1 IMPLANT
SUT FIBERWIRE #2 38 REV NDL BL (SUTURE) ×6
SUT MNCRL AB 4-0 PS2 18 (SUTURE) ×3 IMPLANT
SUT MON AB 2-0 CT1 36 (SUTURE) ×3 IMPLANT
SUT VIC AB 0 CT1 27 (SUTURE) ×3
SUT VIC AB 0 CT1 27XBRD ANBCTR (SUTURE) ×1 IMPLANT
SUT VIC AB 2-0 CT1 27 (SUTURE) ×6
SUT VIC AB 2-0 CT1 TAPERPNT 27 (SUTURE) ×2 IMPLANT
SUTURE FIBERWR#2 38 REV NDL BL (SUTURE) ×2 IMPLANT
SYR CONTROL 10ML LL (SYRINGE) ×3 IMPLANT
TOWEL OR 17X24 6PK STRL BLUE (TOWEL DISPOSABLE) ×3 IMPLANT
TOWEL OR 17X26 10 PK STRL BLUE (TOWEL DISPOSABLE) ×3 IMPLANT
WATER STERILE IRR 1000ML POUR (IV SOLUTION) ×3 IMPLANT

## 2016-02-18 NOTE — Anesthesia Procedure Notes (Addendum)
Anesthesia Regional Block:  Interscalene brachial plexus block  Pre-Anesthetic Checklist: ,, timeout performed, Correct Patient, Correct Site, Correct Laterality, Correct Procedure, Correct Position, site marked, Risks and benefits discussed,  Surgical consent,  Pre-op evaluation,  At surgeon's request and post-op pain management  Laterality: Right  Prep: chloraprep       Needles:  Injection technique: Single-shot  Needle Type: Echogenic Stimulator Needle     Needle Length: 5cm 5 cm Needle Gauge: 22 and 22 G    Additional Needles:  Procedures: ultrasound guided (picture in chart) Interscalene brachial plexus block Narrative:  Start time: 02/18/2016 8:00 AM End time: 02/18/2016 8:05 AM Injection made incrementally with aspirations every 5 mL.  Performed by: Personally  Anesthesiologist: Cecile HearingURK, Blong Busk EDWARD  Additional Notes: Functioning IV was confirmed and monitors were applied.  A 50mm 22ga Arrow echogenic stimulator needle was used. Sterile prep and drape, hand hygiene, and sterile gloves were used.  Negative aspiration and negative test dose prior to incremental administration of local anesthetic. The patient tolerated the procedure well.  Ultrasound guidance: relevent anatomy identified, needle position confirmed, local anesthetic spread visualized around nerve(s), vascular puncture avoided.  Image printed for medical record.

## 2016-02-18 NOTE — Interval H&P Note (Signed)
History and Physical Interval Note:  02/18/2016 8:30 AM  [name] HampshireLynn C Heiler  has presented today for surgery, with the diagnosis of PRIMARY OSTEOARTHRITIS RIGHT SHOULDER  The various methods of treatment have been discussed with the patient and family. After consideration of risks, benefits and other options for treatment, the patient has consented to  Procedure(s): RIGHT REVERSE TOTAL SHOULDER ARTHROPLASTY (Right) as a surgical intervention .  The patient's history has been reviewed, patient examined, no change in status, stable for surgery.  I have reviewed the patient's chart and labs.  Questions were answered to the patient's satisfaction.     Loreta Aveaniel F Shoshana Johal

## 2016-02-18 NOTE — Anesthesia Postprocedure Evaluation (Signed)
Anesthesia Post Note  Patient: [name] [name]  Procedure(s) Performed: Procedure(s) (LRB): RIGHT REVERSE TOTAL SHOULDER ARTHROPLASTY (Right)  Patient location during evaluation: PACU Anesthesia Type: General and Regional Level of consciousness: awake and alert, oriented and awake Pain management: pain level controlled Vital Signs Assessment: post-procedure vital signs reviewed and stable Respiratory status: spontaneous breathing, nonlabored ventilation, respiratory function stable and patient connected to nasal cannula oxygen Cardiovascular status: blood pressure returned to baseline and stable Postop Assessment: no signs of nausea or vomiting Anesthetic complications: no    Last Vitals:  Vitals:   02/18/16 0738  BP: (!) 177/66  Pulse: 97  Resp: 20  Temp: 36.8 C    Last Pain:  Vitals:   02/18/16 0738  TempSrc: Oral  PainSc:                  Cecile HearingStephen Edward [name]

## 2016-02-18 NOTE — Discharge Instructions (Signed)
°  Care After Instructions Refer to this sheet in the next few weeks. These discharge instructions provide you with general information on caring for yourself after you leave the hospital. Your caregiver may also give you specific instructions. Your treatment has been planned according to the most current medical practices available, but unavoidable complications sometimes occur. If you have any problems or questions after discharge, please call your caregiver. HOME INSTRUCTIONS You may resume a normal diet and activities as directed.  Do not remove bandage May shower in 3 days Wear your sling for the next 6 weeks unless otherwise instructed. Eat a well-balanced diet.  Avoid lifting or driving until you are instructed otherwise.  Make an appointment to see your caregiver for stitches (suture) or staple removal one week after surgery.   SEEK MEDICAL CARE IF: You have swelling of your calf or leg.  You develop shortness of breath or chest pain.  You have redness, swelling, or increasing pain in the wound.  There is pus or any unusual drainage coming from the surgical site.  You notice a bad smell coming from the surgical site or dressing.  The surgical site breaks open after sutures or staples have been removed.  There is persistent bleeding from the suture or staple line.  You are getting worse or are not improving.  You have any other questions or concerns.  SEEK IMMEDIATE MEDICAL CARE IF:  You have a fever greater than 101 You develop a rash.  You have difficulty breathing.  You develop any reaction or side effects to medicines given.  Your knee motion is decreasing rather than improving.  MAKE SURE YOU:  Understand these instructions.  Will watch your condition.  Will get help right away if you are not doing well or get worse.

## 2016-02-18 NOTE — Anesthesia Procedure Notes (Signed)
Procedure Name: Intubation Date/Time: 02/18/2016 9:04 AM Performed by: Lodema HongSIMPSON, Teague Goynes ANN Pre-anesthesia Checklist: Patient identified, Emergency Drugs available, Suction available and Patient being monitored Patient Re-evaluated:Patient Re-evaluated prior to inductionOxygen Delivery Method: Circle system utilized Preoxygenation: Pre-oxygenation with 100% oxygen Intubation Type: IV induction Ventilation: Mask ventilation without difficulty Laryngoscope Size: Mac and 3 Grade View: Grade I Tube type: Oral Tube size: 7.0 mm Number of attempts: 1 Placement Confirmation: ETT inserted through vocal cords under direct vision,  positive ETCO2 and breath sounds checked- equal and bilateral Secured at: 22 cm Tube secured with: Tape Dental Injury: Teeth and Oropharynx as per pre-operative assessment

## 2016-02-18 NOTE — Transfer of Care (Signed)
Immediate Anesthesia Transfer of Care Note  Patient: [name] [name]  Procedure(s) Performed: Procedure(s): RIGHT REVERSE TOTAL SHOULDER ARTHROPLASTY (Right)  Patient Location: PACU  Anesthesia Type:General and Regional  Level of Consciousness: awake, alert  and oriented  Airway & Oxygen Therapy: Patient connected to nasal cannula oxygen  Post-op Assessment: Report given to RN, Post -op Vital signs reviewed and stable and Patient moving all extremities  Post vital signs: Reviewed and stable  Last Vitals:  Vitals:   02/18/16 0738  BP: (!) 177/66  Pulse: 97  Resp: 20  Temp: 36.8 C    Last Pain:  Vitals:   02/18/16 0738  TempSrc: Oral  PainSc:          Complications: No apparent anesthesia complications

## 2016-02-18 NOTE — Discharge Summary (Addendum)
Patient ID: [name] HampshireLynn C Pegg MRN: [id number] DOB/AGE: 1946/02/29 70 y.o.  Admit date: 02/18/2016 Discharge date: 02/20/2016  Admission Diagnoses:  Active Problems:   Localized primary osteoarthritis of right shoulder region   Discharge Diagnoses:  Same  Past Medical History:  Diagnosis Date  . Allergy   . Anxiety   . Arthritis   . Asthma    Bronchial   . Celiac disease   . Full dentures   . Headache    migraines  . History of hiatal hernia   . Hyperlipidemia   . Hypertension   . IBS (irritable bowel syndrome)   . Pneumonia    "years ago"  . PONV (postoperative nausea and vomiting)   . Wears glasses    readers    Surgeries: Procedure(s): RIGHT REVERSE TOTAL SHOULDER ARTHROPLASTY on 02/18/2016   Consultants:   Discharged Condition: Improved  Hospital Course: [name] [name] is an 70 y.o. female who was admitted 02/18/2016 for operative treatment of primary localized osteoarthritis right shoulder. Patient has severe unremitting pain that affects sleep, daily activities, and work/hobbies. After pre-op clearance the patient was taken to the operating room on 02/18/2016 and underwent  Procedure(s): RIGHT REVERSE TOTAL SHOULDER ARTHROPLASTY.    Patient was given perioperative antibiotics:  Anti-infectives    Start     Dose/Rate Route Frequency Ordered Stop   02/18/16 2000  vancomycin (VANCOCIN) IVPB 1000 mg/200 mL premix     1,000 mg 200 mL/hr over 60 Minutes Intravenous Every 12 hours 02/18/16 1405 02/18/16 2244   02/18/16 0604  vancomycin (VANCOCIN) IVPB 1000 mg/200 mL premix     1,000 mg 200 mL/hr over 60 Minutes Intravenous On call to O.R. 02/18/16 0604 02/18/16 0810       Patient was given sequential compression devices, early ambulation, and chemoprophylaxis to prevent DVT.  Patient benefited maximally from hospital stay and there were no complications.    Recent vital signs:  Patient Vitals for the past 24 hrs:  BP Temp Temp src Pulse Resp SpO2 Height Weight   02/20/16 0540 (!) 145/69 98.4 F (36.9 C) Oral 85 - 94 % - -  02/20/16 0100 126/71 - - (!) 101 - - - -  02/20/16 0049 - - - - - - 5' 5.5" (1.664 m) 74.8 kg (165 lb)  02/19/16 2015 (!) 174/82 98.7 F (37.1 C) Oral (!) 105 18 95 % - -  02/19/16 1511 131/68 98.7 F (37.1 C) Oral - 18 99 % - -     Recent laboratory studies: No results for input(s): WBC, HGB, HCT, PLT, NA, K, CL, CO2, BUN, CREATININE, GLUCOSE, INR, CALCIUM in the last 72 hours.  Invalid input(s): PT, 2   Discharge Medications:     Medication List    TAKE these medications   acetaminophen 325 MG tablet Commonly known as:  TYLENOL Take 650 mg by mouth every 6 (six) hours as needed (for pain).   aspirin EC 81 MG tablet Take 81 mg by mouth every other day. Evening   BEPREVE 1.5 % Soln Generic drug:  Bepotastine Besilate Place 1 drop into both eyes daily as needed (for allergies/hay fever.).   Biotin 5 MG Caps Take 5 mg by mouth daily.   diazepam 2 MG tablet Commonly known as:  VALIUM Take 1 tablet (2 mg total) by mouth every 8 (eight) hours as needed for muscle spasms.   doxycycline 100 MG capsule Commonly known as:  VIBRAMYCIN Take 100 mg by mouth 2 (two) times daily as  needed (rosacea).   HYDROmorphone 2 MG tablet Commonly known as:  DILAUDID Take 1-2 tabs po q4-6 hours prn pain   IMODIUM A-D PO Take by mouth.   OLIVE LEAF EXTRACT PO Take 1 tablet by mouth daily.   olmesartan 40 MG tablet Commonly known as:  BENICAR Take 1 tablet (40 mg total) by mouth daily.   ondansetron 4 MG tablet Commonly known as:  ZOFRAN Take 1 tablet (4 mg total) by mouth every 8 (eight) hours as needed for nausea or vomiting.   PREMARIN 1.25 MG tablet Generic drug:  estrogens (conjugated) Take 1.25 mg by mouth daily.   Resveratrol 250 MG Caps Take 250 mg by mouth daily.   Vitamin D3 2000 units capsule Take 2,000 Units by mouth daily.       Diagnostic Studies: Dg Chest 2 View  Result Date:  02/06/2016 CLINICAL DATA:  Preop evaluation.  History of bronchitis. EXAM: CHEST  2 VIEW COMPARISON:  None. FINDINGS: Lungs are adequately inflated without focal consolidation or effusion. Subtle linear scarring over the anterior lung bases on the lateral film. Cardiomediastinal silhouette is within normal. Very minimal degenerative change of the spine. Surgical clips over the upper abdomen. IMPRESSION: No active cardiopulmonary disease. Electronically Signed   By: Elberta Fortisaniel  Boyle M.D.   On: 02/06/2016 12:41   Dg Shoulder Right Port  Result Date: 02/18/2016 CLINICAL DATA:  Status post reverse total shoulder joint replacement EXAM: PORTABLE RIGHT SHOULDER COMPARISON:  PA and lateral chest x-ray of February 06, 2016 FINDINGS: A single AP portable film with the shoulder in neutral position reveals the presence of a reverse prosthesis. Radiographic positioning of the prosthetic components is good. The interface with the native bone appears normal. There is mild widening of the [facility] joint which is chronic. IMPRESSION: No immediate postprocedure complication following reverse right shoulder joint replacement. Electronically Signed   By: David  Swazil[place]Jordan M.D.   On: 02/18/2016 12:30    Disposition: 01-Home or Self Care    Follow-up Information    [name], MD. Schedule an appointment as soon as possible for a visit in 1 week(s).   Specialty:  Orthopedic Surgery Contact information: [address] [place] [address] [phone]            Signed: Otilio SaberM Lindsey Gabrille Kilbride 02/20/2016, 10:08 AM

## 2016-02-19 ENCOUNTER — Inpatient Hospital Stay (HOSPITAL_COMMUNITY): Payer: Medicare Other

## 2016-02-19 ENCOUNTER — Encounter (HOSPITAL_COMMUNITY): Payer: Self-pay | Admitting: General Practice

## 2016-02-19 DIAGNOSIS — M79609 Pain in unspecified limb: Secondary | ICD-10-CM

## 2016-02-19 NOTE — Progress Notes (Signed)
VASCULAR LAB PRELIMINARY  PRELIMINARY  PRELIMINARY  PRELIMINARY  Right lower extremity venous duplex completed.    Preliminary report:  There is no DVT or SVT noted in the right lower extremity.   Deryck Hippler, RVT 02/19/2016, 9:05 AM

## 2016-02-19 NOTE — Evaluation (Addendum)
Occupational Therapy Evaluation Patient Details Name: Wellington HampshireLynn C Kretsch MRN: [id number] DOB: 02-16-1946 Today's Date: 02/19/2016    History of Present Illness   Pt is a 70 y.o. female s/p R reverse total shoulder arthroplasty. Pt has a past medical history significant for anxiety, arthritis, asthma, celiac disease, hyperlipidemia, hupertension, irritable bowerl syndrome, pneumonia, and postoperative nausea and vomiting.   Clinical Impression   PTA, pt was independent with all ADL and IADL and functional mobility. Pt currently requires min assist for UB dressing and supervision for functional mobility. Educated pt concerning conservative shoulder protocol, use of ice for pain management, sling wear schedule, bathing/dressing techniques, elbow/wrist/hand AROM, and head and neck ROM. Pt lives alone but plans to have friend stay with her 24 hours/day for 3-4 days post-acute D/C. Recommend D/C home with intermitted supervision and no OT follow-up at this time. Pt would benefit from continued OT services while admitted for pt education and to improve independence with ADL. OT will continue to follow acutely.    Follow Up Recommendations  No OT follow up;Supervision - Intermittent;Other (comment) (Follow-up per MD)    Equipment Recommendations  None recommended by OT       Precautions / Restrictions Precautions Precautions: Shoulder Type of Shoulder Precautions: Conservative: sling all times (sling when showering),  Shoulder Interventions: Shoulder sling/immobilizer (R shoulder) Restrictions Weight Bearing Restrictions: Yes RUE Weight Bearing: Non weight bearing      Mobility Bed Mobility Overal bed mobility: Needs Assistance Bed Mobility: Supine to Sit;Sit to Supine     Supine to sit: Supervision Sit to supine: Supervision   General bed mobility comments: Supervision for safety  Transfers Overall transfer level: Needs assistance Equipment used: None Transfers: Sit to/from Stand Sit to  Stand: Supervision              Balance Overall balance assessment: Needs assistance Sitting-balance support: No upper extremity supported;Feet supported Sitting balance-Leahy Scale: Normal     Standing balance support: Single extremity supported;No upper extremity supported;During functional activity Standing balance-Leahy Scale: Fair Standing balance comment: Required single UE support at times while standing for comfort but able to ambulate without UE support                            ADL Overall ADL's : Needs assistance/impaired Eating/Feeding: Set up;Sitting   Grooming: Sitting;Set up   Upper Body Bathing: Minimal assitance;Sitting   Lower Body Bathing: Sit to/from stand;Supervison/ safety   Upper Body Dressing : Minimal assistance;Sitting   Lower Body Dressing: Sit to/from stand;Supervision/safety   Toilet Transfer: Supervision/safety   Toileting- Clothing Manipulation and Hygiene: Supervision/safety       Functional mobility during ADLs: Supervision/safety General ADL Comments: Educated pt on conservative shoulder protocol, sling wear schedule, elbow/wrist/hand AROM, dressing and bathing techniques.      Vision Vision Assessment?: No apparent visual deficits          Pertinent Vitals/Pain Pain Assessment: Faces Faces Pain Scale: Hurts even more Pain Location: R shoulder Pain Descriptors / Indicators: Aching;Operative site guarding;Grimacing Pain Intervention(s): Limited activity within patient's tolerance;Monitored during session;Repositioned;Ice applied     Hand Dominance Right   Extremity/Trunk Assessment Upper Extremity Assessment Upper Extremity Assessment: RUE deficits/detail RUE Deficits / Details: Increased pain and decreased ROM/strength post-operatively. RUE: Unable to fully assess due to immobilization;Unable to fully assess due to pain   Lower Extremity Assessment Lower Extremity Assessment: Overall WFL for tasks assessed        Communication  Communication Communication: No difficulties   Cognition Arousal/Alertness: Awake/alert Behavior During Therapy: WFL for tasks assessed/performed Overall Cognitive Status: Within Functional Limits for tasks assessed                        Exercises Exercises: Shoulder     Shoulder Instructions Shoulder Instructions Method for sponge bathing under operated UE: Supervision/safety Donning/doffing sling/immobilizer: Minimal assistance Correct positioning of sling/immobilizer: Supervision/safety ROM for elbow, wrist and digits of operated UE: Supervision/safety Sling wearing schedule (on at all times/off for ADL's): Supervision/safety Proper positioning of operated UE when showering: Supervision/safety Positioning of UE while sleeping: Supervision/safety    Home Living Family/patient expects to be discharged to:: Private residence Living Arrangements: Alone Available Help at Discharge: Friend(s);Available 24 hours/day (Friend staying with pt for 3-4 days.) Type of Home: House    Home access: Stairs to enter Entergy CorporationEntrance Stairs-Number of Steps: 3 Entrance Stairs-Rails: Right Home Layout: Two level; bed/bath upstairs Alternate Level Stairs-Number of: Flight Alternate Stairs-Rails: Right        Bathroom Shower/Tub: Producer, television/film/videoWalk-in shower   Bathroom Toilet: Standard     Home Equipment: None          Prior Functioning/Environment Level of Independence: Independent                 OT Problem List: Decreased strength;Decreased range of motion;Decreased activity tolerance;Impaired balance (sitting and/or standing);Decreased safety awareness;Decreased knowledge of use of DME or AE;Decreased knowledge of precautions;Pain   OT Treatment/Interventions: Self-care/ADL training;Therapeutic exercise;Neuromuscular education;Therapeutic activities;Patient/family education;Balance training    OT Goals(Current goals can be found in the care plan section) Acute  Rehab OT Goals Patient Stated Goal: feel less nausea/pain OT Goal Formulation: With patient Time For Goal Achievement: 03/04/16 Potential to Achieve Goals: Good ADL Goals Pt Will Perform Upper Body Dressing: with supervision;sitting Pt Will Perform Tub/Shower Transfer: with supervision;Shower transfer;ambulating Pt/caregiver will Perform Home Exercise Program: Right Upper extremity;With written HEP provided;Independently (elbow/wrist/hand AROM)  OT Frequency: Min 2X/week    End of Session Equipment Utilized During Treatment: Gait belt  Activity Tolerance: Patient tolerated treatment well Patient left: in bed;with call bell/phone within reach   Time: 0950-1020 OT Time Calculation (min): 30 min Charges:  OT General Charges $OT Visit: 1 Procedure OT Evaluation $OT Eval Moderate Complexity: 1 Procedure OT Treatments $Self Care/Home Management : 8-22 mins  Doristine SectionCharity A Sheretha Shadd, OTR/L [phone] 02/19/2016, 10:52 AM

## 2016-02-19 NOTE — Progress Notes (Signed)
Pt states still is not feeling well Dr. Eulah PontMurphy decided to keep pt one more night to help with pain control

## 2016-02-19 NOTE — Progress Notes (Addendum)
Occupational Therapy Treatment Patient Details Name: [name] [name] MRN: [id number] DOB: [birth year] Today's Date: 02/19/2016    History of present illness Pt is a 70 y.o. female s/p R reverse total shoulder arthroplasty. Pt has a past medical history significant for anxiety, arthritis, asthma, celiac disease, hyperlipidemia, hupertension, irritable bowerl syndrome, pneumonia, and postoperative nausea and vomiting.   OT comments  Pt progressing well toward OT goals. Pt continues to require min assist for UB dressing and supervision for functional mobility. Pt continues to feel weak with ambulation and have increased pain. Pt expressed concern with D/C today and reports considering staying another night due to pain. Pt would continue to benefit from OT services while admitted to improve independence with ADL prior to D/C home. Continued education concerning conservative shoulder protocol, elbow/wrist/hand AROM, head/neck AROM, and dressing techniques and pt reports understanding and would benefit from further reinforcement. D/C plan remains appropriate. OT will continue to follow acutely.   Follow Up Recommendations  No OT follow up;Supervision - Intermittent;Other (comment)    Equipment Recommendations  None recommended by OT       Precautions / Restrictions Precautions Precautions: Shoulder Type of Shoulder Precautions: Conservative: sling all times (sling when showering),  Shoulder Interventions: Shoulder sling/immobilizer Precaution Booklet Issued: Yes (comment) Precaution Comments: Reviewed conservative protocol and handout with pt. Required Braces or Orthoses: Sling (R shoulder sling) Restrictions Weight Bearing Restrictions: Yes RUE Weight Bearing: Non weight bearing       Mobility Bed Mobility Overal bed mobility: Needs Assistance Bed Mobility: Supine to Sit     Supine to sit: Supervision     General bed mobility comments: Supervision for safety  Transfers Overall  transfer level: Needs assistance Equipment used: None Transfers: Sit to/from Stand Sit to Stand: Supervision              Balance Overall balance assessment: Needs assistance Sitting-balance support: No upper extremity supported;Feet supported Sitting balance-Leahy Scale: Normal     Standing balance support: No upper extremity supported;During functional activity Standing balance-Leahy Scale: Fair                     ADL Overall ADL's : Needs assistance/impaired Eating/Feeding: Set up;Sitting   Grooming: Sitting;Set up;Wash/dry face           Upper Body Dressing : Minimal assistance;Sitting                   Functional mobility during ADLs: Supervision/safety General ADL Comments: Educated pt on conservative shoulder protocol, sling wear schedule, elbow/wrist/hand AROM, dressing and bathing techniques.       Vision                 Additional Comments: Glasses for reading          Cognition   Behavior During Therapy: [facility] tasks assessed/performed Overall Cognitive Status: Within Functional Limits for tasks assessed                         Exercises Shoulder Exercises Elbow Flexion: AROM;10 reps;Standing Wrist Flexion: AROM;10 reps;Seated Digit Composite Flexion: AROM;10 reps;Seated Method for sponge bathing under operated UE: Supervision/safety Donning/doffing sling/immobilizer: Minimal assistance Correct positioning of sling/immobilizer: Supervision/safety ROM for elbow, wrist and digits of operated UE: Supervision/safety Sling wearing schedule (on at all times/off for ADL's): Supervision/safety Proper positioning of operated UE when showering: Supervision/safety Positioning of UE while sleeping: Supervision/safety   Shoulder Instructions Shoulder Instructions Method for sponge bathing under  operated UE: Supervision/safety Donning/doffing sling/immobilizer: Minimal assistance Correct positioning of sling/immobilizer:  Supervision/safety ROM for elbow, wrist and digits of operated UE: Supervision/safety Sling wearing schedule (on at all times/off for ADL's): Supervision/safety Proper positioning of operated UE when showering: Supervision/safety Positioning of UE while sleeping: Supervision/safety          Pertinent Vitals/ Pain       Pain Assessment: Faces Faces Pain Scale: Hurts even more Pain Location: R shoulder Pain Descriptors / Indicators: Aching;Operative site guarding;Grimacing Pain Intervention(s): Limited activity within patient's tolerance;Monitored during session;Repositioned         Frequency  Min 2X/week        Progress Toward Goals  OT Goals(current goals can now be found in the care plan section)  Progress towards OT goals: Progressing toward goals  Acute Rehab OT Goals Patient Stated Goal: feel better before going home. OT Goal Formulation: With patient Time For Goal Achievement: 03/04/16 Potential to Achieve Goals: Good ADL Goals Pt Will Perform Upper Body Dressing: with supervision;sitting Pt Will Perform Tub/Shower Transfer: with supervision;Shower transfer;ambulating Pt/caregiver will Perform Home Exercise Program: Right Upper extremity;With written HEP provided;Independently (elbow/wrist/hand AROM)  Plan Discharge plan remains appropriate       End of Session Equipment Utilized During Treatment: Gait belt;Oxygen (2L O2)   Activity Tolerance Patient tolerated treatment well   Patient Left in bed;with call bell/phone within reach           Time: 1225-1253 OT Time Calculation (min): 28 min  Charges: OT General Charges $OT Visit: 1 Procedure OT Treatments $Self Care/Home Management : 8-22 mins $Therapeutic Exercise: 8-22 mins  Doristine SectionCharity A Cam Dauphin, OTR/L [phone] 02/19/2016, 1:40 PM

## 2016-02-19 NOTE — Op Note (Signed)
NAMLouellen [name]:  Yeomans, Vanice                  ACCOUNT NO.:  [id number]  MEDICAL RECORD NO.:  [id number]  LOCATION:  [id number]                        FACILITY:  [facility]  PHYSICIAN:  Loreta Aveaniel F. Marian Meneely, M.D. DATE OF BIRTH:  1945-10-14  DATE OF PROCEDURE:  02/18/2016 DATE OF DISCHARGE:                              OPERATIVE REPORT   PREOPERATIVE DIAGNOSES:  Right shoulder end-stage arthritis secondary to rotator cuff arthropathy.  Primary localized.  Rotator cuff deficient.  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  Primary localized.  Rotator cuff deficient with retained numerous anchors within the humeral head and tuberosity as well as suture from previous cuff repairs.  PROCEDURE:  Right shoulder reverse total shoulder replacement utilizing Stryker prosthesis.  A 28 mm glenoid base plate with 4 screw fixation. A 32+ 2 glenosphere press-fit.  A press-fit 11 mm humeral stem with a 32+ 4 base plate.  Also, removal of numerous anchors and sutures from the humeral head and tuberosity.  ASSISTANT:  Mikey KirschnerLindsey Stanberry, P.A., present throughout the entire case and necessary for timely completion of procedure.  ANESTHESIA:  General.  BLOOD LOSS:  100 mL.  BLOOD GIVEN:  None.  SPECIMENS:  None.  CULTURES:  None.  COMPLICATIONS:  None.  DRESSINGS:  Soft compressive shoulder immobilizer.  DESCRIPTION OF PROCEDURE:  The patient was brought to the operating room and after adequate anesthesia had been obtained, placed in beach-chair position, shoulder positioner, prepped and draped in usual sterile fashion.  Deltopectoral incision.  Retractor was put in place. Conjoined tendon was retracted.  Subscap taken down, tagged with FiberWire.  Supraspinatus completely deficient.  The humeral head was cut in line with definitive prosthesis with 30 degrees retroversion. Numerous anchors and sutures were then taken out of the humeral head to allow  facilitation of the implant.  Glenoid exposed.  Brought up to good bleeding bone.  Using the Stryker instrumentation, the glenoid was placed at the inferior aspect of the glenoid.  After being reamed, this was fitted in place and then fixed with a central screw and then superior, inferior, and posterior screw.  Nice solid stable fixation. The glenosphere was attached and hammered in place after trials.  The humerus was brought up to good fitting with an 11 mm stem at 30 degrees of retroversion.  After appropriate trials, I utilized a 32+ 2 base plate attached to the top of the humerus to complete the reversed total shoulder.  This gave great stability, full passive motion.  Wound irrigated.  Soft tissue was injected with Exparel.  Subscap repaired anatomically.  Deltopectoral interval repaired.  Subcutaneous and subcuticular closure.  Margins were injected with Marcaine.  Sterile compressive dressing applied.  Anesthesia reversed.  Brought to the recovery room.  Tolerated the surgery well with no complications.     Loreta Aveaniel F. Glendon Fiser, M.D.     DFM/MEDQ  D:  02/18/2016  T:  02/19/2016  Job:  [id number]

## 2016-02-19 NOTE — Progress Notes (Signed)
Patient ID: [name] HampshireLynn C Asmus, female   DOB: [birth year], 70 y.o.   MRN: [id number]   OK to d/c home as doppler U/S negative for DVT

## 2016-02-20 ENCOUNTER — Telehealth: Payer: Self-pay | Admitting: Cardiology

## 2016-02-20 ENCOUNTER — Encounter (HOSPITAL_COMMUNITY): Payer: Self-pay | Admitting: Orthopedic Surgery

## 2016-02-20 NOTE — Progress Notes (Signed)
Occupational Therapy Treatment/Discharge Patient Details Name: [name] [name] MRN: [id number] DOB: March 07, 1946 Today's Date: 02/20/2016    History of present illness Pt is a 70 y.o. female s/p R reverse total shoulder arthroplasty. Pt has a past medical history significant for anxiety, arthritis, asthma, celiac disease, hyperlipidemia, hupertension, irritable bowerl syndrome, pneumonia, and postoperative nausea and vomiting.   OT comments  Pt making good progress with OT and has met 2/3 OT goals and UB dressing goal is adequate for D/C as pt has good support and 24 hour assistance at home. All education complete concerning conservative shoulder protocol, sling wear schedule, elbow/wrist/hand exercises, head/neck exercises, use of ice for pain management, and safety post-acute D/C. Pt verbalizes and demonstrates understanding and is able to adhere to NWB and no AROM/PROM precautions with R shoulder independently. Pt has no further questions/concerns and no further acute OT needs identified. D/C plan remains appropriate. OT will sign off.   Follow Up Recommendations  No OT follow up;Supervision - Intermittent;Other (comment) (Follow-up per MD)    Equipment Recommendations  None recommended by OT       Precautions / Restrictions Precautions Precautions: Shoulder Type of Shoulder Precautions: Conservative: sling all times (sling when showering),  Shoulder Interventions: Shoulder sling/immobilizer Precaution Booklet Issued: Yes (comment) Precaution Comments: Reviewed conservative protocol and handout with pt. Required Braces or Orthoses: Sling (R shoulder sling/immobilizer) Restrictions Weight Bearing Restrictions: Yes RUE Weight Bearing: Non weight bearing       Mobility Bed Mobility               General bed mobility comments: Received in recliner  Transfers Overall transfer level: Modified independent                    Balance Overall balance assessment: Modified  Independent                                 ADL Overall ADL's : Needs assistance/impaired                 Upper Body Dressing : Minimal assistance;Sitting Upper Body Dressing Details (indicate cue type and reason): Reviewed dressing techniques to prevent shoulder movement.     Toilet Transfer: Modified Dentist and Hygiene: Modified independent   Tub/ Banker: Supervision/safety   Functional mobility during ADLs: Supervision/safety General ADL Comments: Completed education concerning elbow/wrist/hand AROM, head/neck AROM, sling wear and dressing techniques.                Cognition   Behavior During Therapy: WFL for tasks assessed/performed Overall Cognitive Status: Within Functional Limits for tasks assessed                         Exercises Shoulder Exercises Elbow Flexion: AROM;10 reps;Standing Wrist Flexion: AROM;10 reps;Seated Digit Composite Flexion: AROM;10 reps;Seated Donning/doffing shirt without moving shoulder: Minimal assistance Method for sponge bathing under operated UE: Supervision/safety Donning/doffing sling/immobilizer: Minimal assistance Correct positioning of sling/immobilizer: Independent ROM for elbow, wrist and digits of operated UE: Independent Sling wearing schedule (on at all times/off for ADL's): Independent Proper positioning of operated UE when showering: Independent Positioning of UE while sleeping: Independent   Shoulder Instructions Shoulder Instructions Donning/doffing shirt without moving shoulder: Minimal assistance Method for sponge bathing under operated UE: Supervision/safety Donning/doffing sling/immobilizer: Minimal assistance Correct positioning of sling/immobilizer: Independent ROM for elbow, wrist and digits of operated  UE: Independent Sling wearing schedule (on at all times/off for ADL's): Independent Proper positioning of operated UE when  showering: Independent Positioning of UE while sleeping: Independent          Pertinent Vitals/ Pain       Pain Assessment: Faces Faces Pain Scale: Hurts little more Pain Location: R shoulder Pain Descriptors / Indicators: Aching;Grimacing;Guarding Pain Intervention(s): Limited activity within patient's tolerance;Monitored during session;Ice applied;Repositioned         Frequency  Min 2X/week        Progress Toward Goals  OT Goals(current goals can now be found in the care plan section)  Progress towards OT goals: Progressing toward goals  Acute Rehab OT Goals Patient Stated Goal: feel better before going home. OT Goal Formulation: With patient Time For Goal Achievement: 03/04/16 Potential to Achieve Goals: Good ADL Goals Pt Will Perform Upper Body Dressing: with supervision;sitting Pt Will Perform Tub/Shower Transfer: with supervision;Shower transfer;ambulating Pt/caregiver will Perform Home Exercise Program: Right Upper extremity;With written HEP provided;Independently  Plan Discharge plan remains appropriate       End of Session Equipment Utilized During Treatment: Other (comment) (R shoulder sling)   Activity Tolerance Patient tolerated treatment well   Patient Left in chair;with call bell/phone within reach   Nurse Communication Other (comment) (OT complete)        Time: 5809-9833 OT Time Calculation (min): 25 min  Charges: OT General Charges $OT Visit: 1 Procedure OT Treatments $Self Care/Home Management : 8-22 mins $Therapeutic Exercise: 8-22 mins  Norman Herrlich, OTR/L [phone] 02/20/2016, 9:56 AM

## 2016-02-20 NOTE — Telephone Encounter (Signed)
Spoke with pt, okay given for her to restart the benicar. She will monitor her bp at home and call with concerns.

## 2016-02-20 NOTE — Progress Notes (Signed)
Pt can d/c home today if pt felt comfortable per PA. Pt spoke with nurse from her cardiologist office regarding her restarting her benicar, once that concern was adressed pt felt comfortable going home. She met all PT/OT goals. Discharge instructions and prescriptions reviewed with pt and friend, all questions answered. Pt assisted to car via NT.   [place], Jerry Caras

## 2016-02-20 NOTE — Telephone Encounter (Signed)
New message      Pt is being discharged today from shoulder surgery.  While in hosp, they stopped her benicar last wed.  Pt is calling to see when should she restart this medication?

## 2016-02-20 NOTE — Care Management (Signed)
Patient s/p right reverse shoulder arthoplasty, will discharge home with family, has no Home Health needs.

## 2016-03-10 ENCOUNTER — Ambulatory Visit: Payer: Medicare Other | Admitting: Physical Therapy

## 2016-03-16 ENCOUNTER — Ambulatory Visit: Payer: Medicare Other | Attending: Orthopedic Surgery | Admitting: Physical Therapy

## 2016-03-16 DIAGNOSIS — M25511 Pain in right shoulder: Secondary | ICD-10-CM

## 2016-03-16 DIAGNOSIS — M25611 Stiffness of right shoulder, not elsewhere classified: Secondary | ICD-10-CM | POA: Insufficient documentation

## 2016-03-16 DIAGNOSIS — M6281 Muscle weakness (generalized): Secondary | ICD-10-CM

## 2016-03-16 DIAGNOSIS — R293 Abnormal posture: Secondary | ICD-10-CM | POA: Insufficient documentation

## 2016-03-16 NOTE — Patient Instructions (Signed)
Cane Exercise: Flexion    Lie on back, holding cane above chest. Keeping arms as straight as possible, lower cane toward floor beyond head. Hold __5__ seconds. Repeat _15___ times. Do _2-3___ sessions per day.
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
## 2016-03-17 NOTE — Addendum Note (Signed)
Addendum  created 03/17/16 [id number] by Cecile HearingStephen Edward Shaunita Seney, MD   Anesthesia Intra Blocks edited, Sign clinical note

## 2016-03-17 NOTE — Addendum Note (Signed)
Addendum  created 03/17/16 0925 by Cecile HearingStephen Edward Turk, MD   Anesthesia Intra Blocks edited, Sign clinical note

## 2016-03-30 ENCOUNTER — Ambulatory Visit: Payer: Medicare Other | Admitting: Physical Therapy

## 2016-03-30 DIAGNOSIS — M25511 Pain in right shoulder: Secondary | ICD-10-CM

## 2016-03-30 DIAGNOSIS — M6281 Muscle weakness (generalized): Secondary | ICD-10-CM

## 2016-03-30 DIAGNOSIS — R293 Abnormal posture: Secondary | ICD-10-CM

## 2016-03-30 DIAGNOSIS — M25611 Stiffness of right shoulder, not elsewhere classified: Secondary | ICD-10-CM

## 2016-03-30 NOTE — Therapy (Signed)
[facility] [address]  [address] [place], [place], [address] Phone: [phone]   Fax:  [phone]  Physical Therapy Treatment  Patient Details  Name: [name] HampshireLynn C [name] MRN: [id number] Date of Birth: [birth year] Referring Provider: [name], MD  Encounter Date: 03/30/2016      PT End of Session - 03/30/16 1256    Visit Number 2   Number of Visits 16   Date for PT Re-Evaluation 05/12/16   PT Start Time 1104   PT Stop Time 1151   PT Time Calculation (min) 47 min   Activity Tolerance Patient tolerated treatment well   Behavior During Therapy [facility] for tasks assessed/performed      Past Medical History:  Diagnosis Date  . Allergy   . Anxiety   . Arthritis   . Asthma    Bronchial   . Celiac disease   . Full dentures   . Headache    migraines  . History of hiatal hernia   . Hyperlipidemia   . Hypertension   . IBS (irritable bowel syndrome)   . Pneumonia    "years ago"  . PONV (postoperative nausea and vomiting)   . Wears glasses    readers    Past Surgical History:  Procedure Laterality Date  . ABDOMINAL HYSTERECTOMY    . APPENDECTOMY    . BREAST LUMPECTOMY  1985   lt  . CARPAL TUNNEL RELEASE     rt  . CHOLECYSTECTOMY    . ELBOW SURGERY    . EYE SURGERY     both cataracts  . KNEE SURGERY     lt  . NASAL SEPTUM SURGERY    . REVERSE SHOULDER ARTHROPLASTY Right 02/18/2016   Procedure: RIGHT REVERSE TOTAL SHOULDER ARTHROPLASTY;  Surgeon: [name], MD;  [facility];  Service: Orthopedics;  Laterality: Right;  . REVERSE TOTAL SHOULDER ARTHROPLASTY Right 02/18/2016  . SHOULDER ACROMIOPLASTY Right 05/09/2014   Procedure: SHOULDER ACROMIOPLASTY;  Surgeon: [name], MD;  Location: [facility];  Service: Orthopedics;  Laterality: Right;  . SHOULDER ARTHROSCOPY WITH BICEPSTENOTOMY Right 05/09/2014   Procedure: SHOULDER ARTHROSCOPY WITH BICEPSTENOTOMY;  Surgeon: [name], MD;   Location: [facility];  Service: Orthopedics;  Laterality: Right;  Debridement Rotator Cuff Tear  . SHOULDER ARTHROSCOPY WITH ROTATOR CUFF REPAIR Right 12/19/2014   Procedure: RIGHT SHOULDER ARTHROSCOPY WITH ROTATOR CUFF REPAIR,CALCIFIC DEBRIDEMENT EXTENSIVE;  Surgeon: [name], MD;  Location: [facility];  Service: Orthopedics;  Laterality: Right;  . SHOULDER ARTHROSCOPY WITH ROTATOR CUFF REPAIR Right 07/10/2015   Procedure: RIGHT SHOULDER ARTHROSCOPY, DEBRIDEMENT  WITH ROTATOR CUFF REPAIR;  Surgeon: [name], MD;  Location: [facility];  Service: Orthopedics;  Laterality: Right;  . SHOULDER SURGERY     left  . TONSILLECTOMY AND ADENOIDECTOMY    . VARICOSE VEIN SURGERY    . WRIST ARTHROSCOPY Right 09/20/2013   Procedure: RIGHT ARTHROSCOPY WRIST EXCISION PISIFORM;  Surgeon: [name], MD;  Location: [facility];  Service: Orthopedics;  Laterality: Right;    There were no vitals filed for this visit.      Subjective Assessment - 03/30/16 1250    Subjective Patient with good compliance of initial HEP - wants to review.    Patient Stated Goals to improve shoulder function   Currently in Pain? Yes   Pain Score 5    Pain Location Shoulder   Pain  Orientation Right   Pain Descriptors / Indicators Aching;Tightness;Nagging   Pain Type Surgical pain   Pain Onset 1 to 4 weeks ago   Pain Frequency Intermittent   Pain Relieving Factors ice, rest                         [facility] PT Treatment/Exercise - 03/30/16 1251      Exercises   Exercises Shoulder     Shoulder Exercises: Standing   Flexion AAROM;Right;10 reps   Flexion Limitations wall wash with L UE assisting R UE   Retraction 10 reps   Retraction Limitations 5 second hold     Shoulder Exercises: Isometric Strengthening   Flexion Limitations 10 x 5 second hold   External Rotation Limitations 10 x 5 second hold   ABduction Limitations 10  x 5 second hold     Manual Therapy   Manual Therapy Passive ROM   Passive ROM flexion, abduction, and ER - all in scapular plane - continued tightness and pain at end range (10 x 15-30" holds at end range)                PT Education - 03/30/16 1254    Education provided Yes   Education Details isometrics, AAROM   Person(s) Educated Patient   Methods Explanation;Demonstration;Handout   Comprehension Verbalized understanding;Returned demonstration;Need further instruction          PT Short Term Goals - 03/17/16 0800      PT SHORT TERM GOAL #1   Title patient to be independent with HEP (04/14/16)   Status New     PT SHORT TERM GOAL #2   Title Patient to improve R shoulder PROM flexion to 120 and ER to 50 (in scapular plane) (04/14/16)   Status New           PT Long Term Goals - 03/17/16 0801      PT LONG TERM GOAL #1   Title patient to be independent with advanced HEP (05/12/16)   Status New     PT LONG TERM GOAL #2   Title Patient to improve AROM of R shoulder to 90 deg flexion (05/12/16)   Status New     PT LONG TERM GOAL #3   Title Patient to demonstrate proper postural alignment with ability to self correct (05/12/16)   Status New               Plan - 03/30/16 1256    Clinical Impression Statement Patient today returning for first treatment session follwoing initial evaluation. Patient with continued tightness into flexon, abduction, and ER with pain at end range. Patient allowing PROM into all planes, however remains heavily guarded and requires VC to reduced resistance during PROM. Patient today issued isometrics as well as scap retraction and AAROM wall was per protocol with patient understanding how to perform each task well. Patient to return to PT in 2 weeks time. Patient to continue to benefit from PT to maximize functional use of R UE.    PT Treatment/Interventions ADLs/Self Care Home Management;Cryotherapy;Electrical Stimulation;Moist  Heat;Ultrasound;Therapeutic exercise;Therapeutic activities;Functional mobility training;Patient/family education;Manual techniques;Passive range of motion;Vasopneumatic Device;Taping;Dry needling   PT Next Visit Plan progress per protocol - conservatively    Consulted and Agree with Plan of Care Patient      Patient will benefit from skilled therapeutic intervention in order to improve the following deficits and impairments:  Decreased activity tolerance, Decreased range of motion, Decreased strength, Impaired  UE functional use, Pain  Visit Diagnosis: Right shoulder pain, unspecified chronicity  Stiffness of right shoulder, not elsewhere classified  Abnormal posture  Muscle weakness (generalized)     Problem List Patient Active Problem List   Diagnosis Date Noted  . 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 Preop cardiovascular exam 07/09/2015  . Chest pain 05/09/2013  . Hypertension 02/23/2012  . Hyperlipidemia 02/23/2012  . Right shoulder pain 01/25/2012  . Left wrist pain 01/16/2012  . Neck pain 12/26/2011     Kipp LaurenceStephanie R Aaron, PT, DPT 03/30/16 1:01 PM   [facility] [address]  [address] [place], [place], [address] Phone: [phone]   Fax:  [phone]  Name: [name] HampshireLynn C Pau MRN: [id number] Date of Birth: Jun 09, 1945

## 2016-03-30 NOTE — Patient Instructions (Signed)
Strengthening: Isometric Flexion  Using wall for resistance, press right fist into ball using light pressure. Hold __5-10__ seconds. Repeat __15__ times per set. Do __2-3__ sets per session.   SHOULDER: Abduction (Isometric)  Use wall as resistance. Press arm against pillow. Keep elbow straight. Hold __5-10_ seconds. __15_ reps per set, _2-3__ sets per day,   External Rotation (Isometric)  Place back of left fist against door frame, with elbow bent. Press fist against door frame. Hold _5-10___ seconds. Repeat __15__ times. Do __2-3__ sessions per day.  Copyright  [employer]. All rights reserved.    Scapular Retraction (Standing)    With arms at sides, pinch shoulder blades together. Repeat _15___ times per set. Do __2-3__ sets per session.     Wall wash - use towel and L arm to guide

## 2016-04-13 ENCOUNTER — Ambulatory Visit: Payer: Medicare Other | Attending: Orthopedic Surgery | Admitting: Physical Therapy

## 2016-04-13 DIAGNOSIS — M25511 Pain in right shoulder: Secondary | ICD-10-CM | POA: Insufficient documentation

## 2016-04-13 DIAGNOSIS — M6281 Muscle weakness (generalized): Secondary | ICD-10-CM | POA: Insufficient documentation

## 2016-04-13 DIAGNOSIS — M25611 Stiffness of right shoulder, not elsewhere classified: Secondary | ICD-10-CM | POA: Insufficient documentation

## 2016-04-13 DIAGNOSIS — R293 Abnormal posture: Secondary | ICD-10-CM | POA: Diagnosis present

## 2016-04-13 NOTE — Therapy (Signed)
[facility] [place] [address]  [address] [place], [place], [address] Phone: [phone]   Fax:  [phone]  Physical Therapy Treatment  Patient Details  Name: [name] [name] MRN: [id number] Date of Birth: April 12, 1946 Referring Provider: [name], MD  Encounter Date: 04/13/2016      PT End of Session - 04/13/16 1103    Visit Number 3   Number of Visits 16   Date for PT Re-Evaluation 05/12/16   PT Start Time 1103   PT Stop Time 1202   PT Time Calculation (min) 59 min   Activity Tolerance Patient tolerated treatment well   Behavior During Therapy [facility] for tasks assessed/performed      Past Medical History:  Diagnosis Date  . Allergy   . Anxiety   . Arthritis   . Asthma    Bronchial   . Celiac disease   . Full dentures   . Headache    migraines  . History of hiatal hernia   . Hyperlipidemia   . Hypertension   . IBS (irritable bowel syndrome)   . Pneumonia    "years ago"  . PONV (postoperative nausea and vomiting)   . Wears glasses    readers    Past Surgical History:  Procedure Laterality Date  . ABDOMINAL HYSTERECTOMY    . APPENDECTOMY    . BREAST LUMPECTOMY  1985   lt  . CARPAL TUNNEL RELEASE     rt  . CHOLECYSTECTOMY    . ELBOW SURGERY    . EYE SURGERY     both cataracts  . KNEE SURGERY     lt  . NASAL SEPTUM SURGERY    . REVERSE SHOULDER ARTHROPLASTY Right 02/18/2016   Procedure: RIGHT REVERSE TOTAL SHOULDER ARTHROPLASTY;  Surgeon: [name], MD;  [facility];  Service: Orthopedics;  Laterality: Right;  . REVERSE TOTAL SHOULDER ARTHROPLASTY Right 02/18/2016  . SHOULDER ACROMIOPLASTY Right 05/09/2014   Procedure: SHOULDER ACROMIOPLASTY;  Surgeon: [name], MD;  Location: [facility];  Service: Orthopedics;  Laterality: Right;  . SHOULDER ARTHROSCOPY WITH BICEPSTENOTOMY Right 05/09/2014   Procedure: SHOULDER ARTHROSCOPY WITH BICEPSTENOTOMY;  Surgeon: [name], MD;   Location: [facility];  Service: Orthopedics;  Laterality: Right;  Debridement Rotator Cuff Tear  . SHOULDER ARTHROSCOPY WITH ROTATOR CUFF REPAIR Right 12/19/2014   Procedure: RIGHT SHOULDER ARTHROSCOPY WITH ROTATOR CUFF REPAIR,CALCIFIC DEBRIDEMENT EXTENSIVE;  Surgeon: [name], MD;  Location: [facility];  Service: Orthopedics;  Laterality: Right;  . SHOULDER ARTHROSCOPY WITH ROTATOR CUFF REPAIR Right 07/10/2015   Procedure: RIGHT SHOULDER ARTHROSCOPY, DEBRIDEMENT  WITH ROTATOR CUFF REPAIR;  Surgeon: [name], MD;  Location: [facility];  Service: Orthopedics;  Laterality: Right;  . SHOULDER SURGERY     left  . TONSILLECTOMY AND ADENOIDECTOMY    . VARICOSE VEIN SURGERY    . WRIST ARTHROSCOPY Right 09/20/2013   Procedure: RIGHT ARTHROSCOPY WRIST EXCISION PISIFORM;  Surgeon: [name], MD;  Location:  SURGERY CENTER;  Service: Orthopedics;  Laterality: Right;    There were no vitals filed for this visit.      Subjective Assessment - 04/13/16 1107    Subjective Pt noting increased posterior shoulder and scapular pain since working on HEP.   Patient Stated Goals to improve shoulder function   Currently in Pain? Yes   Pain Score 6    Pain Location Scapula   Pain  Orientation Right   Pain Descriptors / Indicators Dull   Pain Type Acute pain;Surgical pain   Pain Frequency Constant   Aggravating Factors  HEP, dressing   Pain Relieving Factors ice, rest                         [facility] Adult PT Treatment/Exercise - 04/13/16 1103      Shoulder Exercises: Supine   Flexion AAROM;Right;10 reps   Flexion Limitations cane     Shoulder Exercises: Standing   Retraction 10 reps   Retraction Limitations 5 second hold     Shoulder Exercises: Isometric Strengthening   Flexion Limitations 10 x 5 second hold - manually resisted by PT to determine degree of pressure exerted   Extension Limitations 10 x 5 second  hold - manually resisted by PT to determine degree of pressure exerted   External Rotation Limitations 10 x 5 second hold - manually resisted by PT to determine degree of pressure exerted   ABduction Limitations 10 x 5 second hold - manually resisted by PT to determine degree of pressure exerted     Modalities   Modalities Electrical Stimulation;Vasopneumatic     Electrical Stimulation   Electrical Stimulation Location R posterior shoulder/periscapular muscles   Electrical Stimulation Action IFC   Electrical Stimulation Parameters 80-150 Hz, intensity to pt tol x15'   Electrical Stimulation Goals Pain     Vasopneumatic   Number Minutes Vasopneumatic  15 minutes   Vasopnuematic Location  Shoulder   Vasopneumatic Pressure Low   Vasopneumatic Temperature  Lowest     Manual Therapy   Manual Therapy Passive ROM;Soft tissue mobilization;Myofascial release   Manual therapy comments STM/MFR & TPR as indicated to periscapular muscles, esp infraspintus, subscapularis & teres major/minor   Passive ROM flexion, abduction, and ER - all in scapular plane - continued tightness and pain at end range (10 x 15-30" holds at end range)                  PT Short Term Goals - 04/13/16 1153      PT SHORT TERM GOAL #1   Title patient to be independent with HEP (04/14/16)   Status On-going     PT SHORT TERM GOAL #2   Title Patient to improve R shoulder PROM flexion to 120 and ER to 50 (in scapular plane) (04/14/16)   Status On-going           PT Long Term Goals - 04/13/16 1153      PT LONG TERM GOAL #1   Title patient to be independent with advanced HEP (05/12/16)   Status On-going     PT LONG TERM GOAL #2   Title Patient to improve AROM of R shoulder to 90 deg flexion (05/12/16)   Status On-going     PT LONG TERM GOAL #3   Title Patient to demonstrate proper postural alignment with ability to self correct (05/12/16)   Status On-going               Plan - 04/13/16 1154     Clinical Impression Statement Pt reporting increased posterior shoulder and scapular pain since working on latest update to HEP but uncertain of which exercise may have triggered increase in pain. Increased muscle tension and TPs noted throughout periscapular muscles, therefore targeted STM/MFR/TPR to these areas with some relief noted. Assessed isometrics with manaul resistance to determine degree of force generated by pt. Treatment concluded with estim  and vaso to promote further muscle relaxtion and decreased pain.   PT Treatment/Interventions ADLs/Self Care Home Management;Cryotherapy;Electrical Stimulation;Moist Heat;Ultrasound;Therapeutic exercise;Therapeutic activities;Functional mobility training;Patient/family education;Manual techniques;Passive range of motion;Vasopneumatic Device;Taping;Dry needling   PT Next Visit Plan MD note for appt on 04/16/16; progress per protocol - conservatively    Consulted and Agree with Plan of Care Patient      Patient will benefit from skilled therapeutic intervention in order to improve the following deficits and impairments:  Decreased activity tolerance, Decreased range of motion, Decreased strength, Impaired UE functional use, Pain  Visit Diagnosis: Right shoulder pain, unspecified chronicity  Stiffness of right shoulder, not elsewhere classified  Abnormal posture  Muscle weakness (generalized)     Problem List Patient Active Problem List   Diagnosis Date Noted  . 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 Preop cardiovascular exam 07/09/2015  . Chest pain 05/09/2013  . Hypertension 02/23/2012  . Hyperlipidemia 02/23/2012  . Right shoulder pain 01/25/2012  . Left wrist pain 01/16/2012  . Neck pain 12/26/2011    Marry Guan, PT, MPT 04/13/2016, 12:13 PM  [facility] [address]  [address] [place], [place], [address] Phone: [phone]   Fax:   [phone]  Name: [name] [name] MRN: [id number] Date of Birth: Apr 03, 1946

## 2016-04-15 ENCOUNTER — Ambulatory Visit: Payer: Medicare Other | Admitting: Physical Therapy

## 2016-04-15 DIAGNOSIS — M25611 Stiffness of right shoulder, not elsewhere classified: Secondary | ICD-10-CM

## 2016-04-15 DIAGNOSIS — M25511 Pain in right shoulder: Secondary | ICD-10-CM | POA: Diagnosis not present

## 2016-04-15 DIAGNOSIS — M6281 Muscle weakness (generalized): Secondary | ICD-10-CM

## 2016-04-15 DIAGNOSIS — R293 Abnormal posture: Secondary | ICD-10-CM

## 2016-04-15 NOTE — Therapy (Signed)
[facility] [address]  [address] [place], [place], [address] Phone: [phone]   Fax:  [phone]  Physical Therapy Treatment  Patient Details  Name: [name] [name] MRN: [id number] Date of Birth: 31-Jan-1946 Referring Provider: Dr. Mckinley Jewel  Encounter Date: 04/15/2016      PT End of Session - 04/15/16 1058    Visit Number 4   Number of Visits 16   Date for PT Re-Evaluation 05/12/16   PT Start Time 1056   PT Stop Time 1200   PT Time Calculation (min) 64 min   Activity Tolerance Patient tolerated treatment well   Behavior During Therapy [facility] for tasks assessed/performed      Past Medical History:  Diagnosis Date  . Allergy   . Anxiety   . Arthritis   . Asthma    Bronchial   . Celiac disease   . Full dentures   . Headache    migraines  . History of hiatal hernia   . Hyperlipidemia   . Hypertension   . IBS (irritable bowel syndrome)   . Pneumonia    "years ago"  . PONV (postoperative nausea and vomiting)   . Wears glasses    readers    Past Surgical History:  Procedure Laterality Date  . ABDOMINAL HYSTERECTOMY    . APPENDECTOMY    . BREAST LUMPECTOMY  1985   lt  . CARPAL TUNNEL RELEASE     rt  . CHOLECYSTECTOMY    . ELBOW SURGERY    . EYE SURGERY     both cataracts  . KNEE SURGERY     lt  . NASAL SEPTUM SURGERY    . REVERSE SHOULDER ARTHROPLASTY Right 02/18/2016   Procedure: RIGHT REVERSE TOTAL SHOULDER ARTHROPLASTY;  Surgeon: Loreta Ave, MD;  [facility] OR;  Service: Orthopedics;  Laterality: Right;  . REVERSE TOTAL SHOULDER ARTHROPLASTY Right 02/18/2016  . SHOULDER ACROMIOPLASTY Right 05/09/2014   Procedure: SHOULDER ACROMIOPLASTY;  Surgeon: Loreta Ave, MD;  Location: [facility];  Service: Orthopedics;  Laterality: Right;  . SHOULDER ARTHROSCOPY WITH BICEPSTENOTOMY Right 05/09/2014   Procedure: SHOULDER ARTHROSCOPY WITH BICEPSTENOTOMY;  Surgeon: Loreta Ave, MD;   Location: [facility];  Service: Orthopedics;  Laterality: Right;  Debridement Rotator Cuff Tear  . SHOULDER ARTHROSCOPY WITH ROTATOR CUFF REPAIR Right 12/19/2014   Procedure: RIGHT SHOULDER ARTHROSCOPY WITH ROTATOR CUFF REPAIR,CALCIFIC DEBRIDEMENT EXTENSIVE;  Surgeon: Loreta Ave, MD;  Location: [facility];  Service: Orthopedics;  Laterality: Right;  . SHOULDER ARTHROSCOPY WITH ROTATOR CUFF REPAIR Right 07/10/2015   Procedure: RIGHT SHOULDER ARTHROSCOPY, DEBRIDEMENT  WITH ROTATOR CUFF REPAIR;  Surgeon: Loreta Ave, MD;  Location: [facility];  Service: Orthopedics;  Laterality: Right;  . SHOULDER SURGERY     left  . TONSILLECTOMY AND ADENOIDECTOMY    . VARICOSE VEIN SURGERY    . WRIST ARTHROSCOPY Right 09/20/2013   Procedure: RIGHT ARTHROSCOPY WRIST EXCISION PISIFORM;  Surgeon: Wyn Forster, MD;  Location: [facility];  Service: Orthopedics;  Laterality: Right;    There were no vitals filed for this visit.      Subjective Assessment - 04/15/16 1057    Subjective Patient continues to be compliant with HEP - having some scapular pain. Felt like ice and estim worked well lsat visit.    Patient Stated Goals to improve shoulder function   Currently in Pain? Yes   Pain Score  5    Pain Location Scapula   Pain Orientation Right   Pain Descriptors / Indicators Aching;Dull   Pain Type Acute pain;Surgical pain   Pain Onset 1 to 4 weeks ago   Pain Frequency Constant   Aggravating Factors  HEP, dressing   Pain Relieving Factors ice, rest            [facility] PT Assessment - 04/15/16 0001      Assessment   Medical Diagnosis R RTSA   Referring Provider Dr. [name]   Onset Date/Surgical Date 02/18/16   Hand Dominance Right   Next MD Visit 04/16/16     Precautions   Precaution Comments RTSA protocol   Required Braces or Orthoses Sling  per MD until follow-up     PROM   PROM Assessment Site Shoulder   Right/Left  Shoulder Right  scapular plane   Right Shoulder Flexion 106 Degrees   Right Shoulder ABduction 98 Degrees   Right Shoulder Internal Rotation 44 Degrees   Right Shoulder External Rotation 20 Degrees                     [facility] Adult PT Treatment/Exercise - 04/15/16 0001      Shoulder Exercises: Seated   Retraction --   Flexion AAROM;Right;15 reps   Flexion Limitations cane - scapular plane - with mirror for visual feedback   Abduction AAROM;Right;15 reps   ABduction Limitations cane - scapular plane - with mirror for visual feedback   Other Seated Exercises elbow flexion with 2# weight in R hand x 15 reps     Shoulder Exercises: Standing   Retraction 15 reps   Retraction Limitations 5 second hold     Shoulder Exercises: Isometric Strengthening   Flexion Limitations HEP review   Extension Limitations HEP review   External Rotation Limitations HEP review   ABduction Limitations HEP review     Modalities   Modalities Electrical Stimulation;Vasopneumatic     Electrical Stimulation   Electrical Stimulation Location R posterior shoulder/periscapular muscles   Electrical Stimulation Action IFC   Electrical Stimulation Parameters to tolerance   Electrical Stimulation Goals Pain     Vasopneumatic   Number Minutes Vasopneumatic  15 minutes   Vasopnuematic Location  Shoulder   Vasopneumatic Pressure Medium   Vasopneumatic Temperature  coldest temp     Manual Therapy   Manual Therapy Passive ROM;Soft tissue mobilization;Myofascial release   Manual therapy comments STM/MFR & TPR to periscapular mm  (infraspintus, subscapularis & teres major/minor)   Passive ROM flexion, abduction, ER and IR - all in scapular plane - continued tightness and pain at end range (10 x 15-30" holds at end range)                  PT Short Term Goals - 04/15/16 1649      PT SHORT TERM GOAL #1   Title patient to be independent with HEP (04/14/16)   Status Achieved     PT SHORT TERM  GOAL #2   Title Patient to improve R shoulder PROM flexion to 120 and ER to 50 (in scapular plane) (04/14/16)   Status On-going           PT Long Term Goals - 04/15/16 1650      PT LONG TERM GOAL #1   Title patient to be independent with advanced HEP (05/12/16)   Status On-going     PT LONG TERM GOAL #2   Title Patient to improve  AROM of R shoulder to 90 deg flexion (05/12/16)   Status On-going     PT LONG TERM GOAL #3   Title Patient to demonstrate proper postural alignment with ability to self correct (05/12/16)   Status On-going               Plan - 04/15/16 1650    Clinical Impression Statement Patient today with conintued subjective reports of R scapular pain, especially with flexion isometrics. Patient today with improved PROM of R shoulder as compared to initial evaluation, however does have an increase in pain and tightness at end range requiring slight overpressure at end range to facilitate good stretch. PT progressing patient today with seated AAROM of R shoulder into flexion and abduction, while maintaining shoulder in scapular plane per protocol. PT session today heavily focused on manual therapy with PROM with soft tissue mobilization as patient remains very tender to touch along lateral scapula border. Continued use of estim and vaso at conclusion of treatment session to promote reduced pain and edema. Patient with excellent HEP compliance. Patient to return to MD on 04/16/16 for follow-up. Patient to continue to benefit from PT to maximize functional use of R UE.   Rehab Potential Good   PT Frequency 2x / week   PT Duration 8 weeks   PT Treatment/Interventions ADLs/Self Care Home Management;Cryotherapy;Electrical Stimulation;Moist Heat;Ultrasound;Therapeutic exercise;Therapeutic activities;Functional mobility training;Patient/family education;Manual techniques;Passive range of motion;Vasopneumatic Device;Taping;Dry needling   PT Next Visit Plan progress per protocol -  conservatively; AAROM against gravity; modalities prn for pain   Consulted and Agree with Plan of Care Patient      Patient will benefit from skilled therapeutic intervention in order to improve the following deficits and impairments:  Decreased activity tolerance, Decreased range of motion, Decreased strength, Impaired UE functional use, Pain  Visit Diagnosis: Right shoulder pain, unspecified chronicity  Stiffness of right shoulder, not elsewhere classified  Abnormal posture  Muscle weakness (generalized)     Problem List Patient Active Problem List   Diagnosis Date Noted  . 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 Preop cardiovascular exam 07/09/2015  . Chest pain 05/09/2013  . Hypertension 02/23/2012  . Hyperlipidemia 02/23/2012  . Right shoulder pain 01/25/2012  . Left wrist pain 01/16/2012  . Neck pain 12/26/2011   Kipp Laurence, PT, DPT 04/15/16 4:57 PM   [facility] [address]  [address] [place], [place], [address] Phone: [phone]   Fax:  [phone]  Name: [name] [name] MRN: [id number] Date of Birth: 12-20-1945

## 2016-04-20 ENCOUNTER — Ambulatory Visit: Payer: Medicare Other

## 2016-04-20 DIAGNOSIS — R293 Abnormal posture: Secondary | ICD-10-CM

## 2016-04-20 DIAGNOSIS — M25511 Pain in right shoulder: Secondary | ICD-10-CM

## 2016-04-20 DIAGNOSIS — M25611 Stiffness of right shoulder, not elsewhere classified: Secondary | ICD-10-CM

## 2016-04-20 DIAGNOSIS — M6281 Muscle weakness (generalized): Secondary | ICD-10-CM

## 2016-04-20 NOTE — Therapy (Signed)
[facility] Outpatient Rehabilitation [facility] [address]  [address] [place], [place], [address] Phone: [phone]   Fax:  [phone]  Physical Therapy Treatment  Patient Details  Name: [name] [name] MRN: [id number] Date of Birth: 06/23/1945 Referring Provider: Dr. Mckinley Jewel  Encounter Date: 04/20/2016      PT End of Session - 04/20/16 1111    Visit Number 5   Number of Visits 16   Date for PT Re-Evaluation 05/12/16   PT Start Time 1105   PT Stop Time 1210   PT Time Calculation (min) 65 min   Activity Tolerance Patient tolerated treatment well   Behavior During Therapy [facility] for tasks assessed/performed      Past Medical History:  Diagnosis Date  . Allergy   . Anxiety   . Arthritis   . Asthma    Bronchial   . Celiac disease   . Full dentures   . Headache    migraines  . History of hiatal hernia   . Hyperlipidemia   . Hypertension   . IBS (irritable bowel syndrome)   . Pneumonia    "years ago"  . PONV (postoperative nausea and vomiting)   . Wears glasses    readers    Past Surgical History:  Procedure Laterality Date  . ABDOMINAL HYSTERECTOMY    . APPENDECTOMY    . BREAST LUMPECTOMY  1985   lt  . CARPAL TUNNEL RELEASE     rt  . CHOLECYSTECTOMY    . ELBOW SURGERY    . EYE SURGERY     both cataracts  . KNEE SURGERY     lt  . NASAL SEPTUM SURGERY    . REVERSE SHOULDER ARTHROPLASTY Right 02/18/2016   Procedure: RIGHT REVERSE TOTAL SHOULDER ARTHROPLASTY;  Surgeon: Loreta Ave, MD;  Location: [facility] OR;  Service: Orthopedics;  Laterality: Right;  . REVERSE TOTAL SHOULDER ARTHROPLASTY Right 02/18/2016  . SHOULDER ACROMIOPLASTY Right 05/09/2014   Procedure: SHOULDER ACROMIOPLASTY;  Surgeon: Loreta Ave, MD;  Location: [facility];  Service: Orthopedics;  Laterality: Right;  . SHOULDER ARTHROSCOPY WITH BICEPSTENOTOMY Right 05/09/2014   Procedure: SHOULDER ARTHROSCOPY WITH BICEPSTENOTOMY;  Surgeon: Loreta Ave, MD;   Location: [facility];  Service: Orthopedics;  Laterality: Right;  Debridement Rotator Cuff Tear  . SHOULDER ARTHROSCOPY WITH ROTATOR CUFF REPAIR Right 12/19/2014   Procedure: RIGHT SHOULDER ARTHROSCOPY WITH ROTATOR CUFF REPAIR,CALCIFIC DEBRIDEMENT EXTENSIVE;  Surgeon: Loreta Ave, MD;  Location: [facility];  Service: Orthopedics;  Laterality: Right;  . SHOULDER ARTHROSCOPY WITH ROTATOR CUFF REPAIR Right 07/10/2015   Procedure: RIGHT SHOULDER ARTHROSCOPY, DEBRIDEMENT  WITH ROTATOR CUFF REPAIR;  Surgeon: Loreta Ave, MD;  Location: [facility];  Service: Orthopedics;  Laterality: Right;  . SHOULDER SURGERY     left  . TONSILLECTOMY AND ADENOIDECTOMY    . VARICOSE VEIN SURGERY    . WRIST ARTHROSCOPY Right 09/20/2013   Procedure: RIGHT ARTHROSCOPY WRIST EXCISION PISIFORM;  Surgeon: Wyn Forster, MD;  Location: [facility];  Service: Orthopedics;  Laterality: Right;    There were no vitals filed for this visit.      Subjective Assessment - 04/20/16 1111    Subjective Pt. reporting MD said ok to come out of sling, start pulleys, and drive without pain medication.     Patient Stated Goals to improve shoulder function   Currently in Pain? Yes   Pain Score 5  Pain Location Scapula   Pain Orientation Right   Pain Descriptors / Indicators Aching;Dull   Pain Type Acute pain;Surgical pain   Pain Onset More than a month ago   Pain Frequency Constant   Multiple Pain Sites No           [facility] Adult PT Treatment/Exercise - 04/20/16 1122      Shoulder Exercises: Supine   Flexion AAROM;Right;10 reps   Flexion Limitations cane     Shoulder Exercises: Seated   Flexion AAROM;Right;15 reps   Flexion Limitations cane - scapular plane - with mirror for visual feedback   Abduction AAROM;Right;15 reps   ABduction Limitations cane - scapular plane - with mirror for visual feedback     Shoulder Exercises: Pulleys   Flexion 3  minutes  Pt. requiring VC's to keep ROM conservative   ABduction 3 minutes  scaption; pt. requiring instruction to keep ROM conservative     Vasopneumatic   Number Minutes Vasopneumatic  15 minutes   Vasopnuematic Location  Shoulder   Vasopneumatic Pressure Medium   Vasopneumatic Temperature  coldest temp     Manual Therapy   Manual Therapy Passive ROM;Soft tissue mobilization;Myofascial release   Manual therapy comments STM/MFR & TPR to periscapular mm  (infraspintus, subscapularis & teres major/minor, Rhomboids)   Passive ROM flexion, abduction, ER and IR - all in scapular plane - continued tightness and pain at end range (5 x 15-30" holds at end range)             PT Short Term Goals - 04/15/16 1649      PT SHORT TERM GOAL #1   Title patient to be independent with HEP (04/14/16)   Status Achieved     PT SHORT TERM GOAL #2   Title Patient to improve R shoulder PROM flexion to 120 and ER to 50 (in scapular plane) (04/14/16)   Status On-going           PT Long Term Goals - 04/15/16 1650      PT LONG TERM GOAL #1   Title patient to be independent with advanced HEP (05/12/16)   Status On-going     PT LONG TERM GOAL #2   Title Patient to improve AROM of R shoulder to 90 deg flexion (05/12/16)   Status On-going     PT LONG TERM GOAL #3   Title Patient to demonstrate proper postural alignment with ability to self correct (05/12/16)   Status On-going               Plan - 04/20/16 1213    Clinical Impression Statement Pt. reporting MD discontinued sling and said ok to start pulleys, with pt. able to drive now.  Pt. tolerated all PROM, gentle R shoulder stretching, and STM/TPR well today.  Pt. still very TTP in teres minor/major area with multiple TTP along medial scap. border today.  Mirror training continued with R shoulder wand flexion, abduction today and therapist tactile cueing to prevent scap. elevation.  Pt. pain remaining 5/10 and less throughout therex today.   Pt. reporting benefit from ice/compression thus ice compression continued today.  Pt. progressing well at this point per protocol.     PT Treatment/Interventions ADLs/Self Care Home Management;Cryotherapy;Electrical Stimulation;Moist Heat;Ultrasound;Therapeutic exercise;Therapeutic activities;Functional mobility training;Patient/family education;Manual techniques;Passive range of motion;Vasopneumatic Device;Taping;Dry needling   PT Next Visit Plan progress per protocol - conservatively; AAROM against gravity; modalities prn for pain      Patient will benefit from skilled therapeutic intervention in order  to improve the following deficits and impairments:  Decreased activity tolerance, Decreased range of motion, Decreased strength, Impaired UE functional use, Pain  Visit Diagnosis: Right shoulder pain, unspecified chronicity  Stiffness of right shoulder, not elsewhere classified  Abnormal posture  Muscle weakness (generalized)     Problem List Patient Active Problem List   Diagnosis Date Noted  . 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 Preop cardiovascular exam 07/09/2015  . Chest pain 05/09/2013  . Hypertension 02/23/2012  . Hyperlipidemia 02/23/2012  . Right shoulder pain 01/25/2012  . Left wrist pain 01/16/2012  . Neck pain 12/26/2011    Kermit Balo, PTA 04/20/16 12:22 PM  [facility] Outpatient Rehabilitation [facility] [address]  [address] [place], [place], [address] Phone: [phone]   Fax:  [phone]  Name: [name] [name] MRN: [id number] Date of Birth: 09-10-1945

## 2016-04-22 ENCOUNTER — Ambulatory Visit: Payer: Medicare Other | Admitting: Physical Therapy

## 2016-04-23 ENCOUNTER — Ambulatory Visit: Payer: Medicare Other

## 2016-04-23 DIAGNOSIS — M25511 Pain in right shoulder: Secondary | ICD-10-CM

## 2016-04-23 DIAGNOSIS — M6281 Muscle weakness (generalized): Secondary | ICD-10-CM

## 2016-04-23 DIAGNOSIS — R293 Abnormal posture: Secondary | ICD-10-CM

## 2016-04-23 DIAGNOSIS — M25611 Stiffness of right shoulder, not elsewhere classified: Secondary | ICD-10-CM

## 2016-04-23 NOTE — Therapy (Signed)
[facility] [address]  [address] [place], [place], [address] Phone: [phone]   Fax:  [phone]  Physical Therapy Treatment  Patient Details  Name: [name] HampshireLynn C Spratling MRN: [id number] Date of Birth: 17-Mar-1946 Referring Provider: Dr. [name]  Encounter Date: 04/23/2016      PT End of Session - 04/23/16 1124    Visit Number 6   Number of Visits 16   Date for PT Re-Evaluation 05/12/16   PT Start Time 1108   PT Stop Time 1204   PT Time Calculation (min) 56 min   Activity Tolerance Patient tolerated treatment well   Behavior During Therapy [facility] for tasks assessed/performed      Past Medical History:  Diagnosis Date  . Allergy   . Anxiety   . Arthritis   . Asthma    Bronchial   . Celiac disease   . Full dentures   . Headache    migraines  . History of hiatal hernia   . Hyperlipidemia   . Hypertension   . IBS (irritable bowel syndrome)   . Pneumonia    "years ago"  . PONV (postoperative nausea and vomiting)   . Wears glasses    readers    Past Surgical History:  Procedure Laterality Date  . ABDOMINAL HYSTERECTOMY    . APPENDECTOMY    . BREAST LUMPECTOMY  1985   lt  . CARPAL TUNNEL RELEASE     rt  . CHOLECYSTECTOMY    . ELBOW SURGERY    . EYE SURGERY     both cataracts  . KNEE SURGERY     lt  . NASAL SEPTUM SURGERY    . REVERSE SHOULDER ARTHROPLASTY Right 02/18/2016   Procedure: RIGHT REVERSE TOTAL SHOULDER ARTHROPLASTY;  Surgeon: [name], MD;  [facility];  Service: Orthopedics;  Laterality: Right;  . REVERSE TOTAL SHOULDER ARTHROPLASTY Right 02/18/2016  . SHOULDER ACROMIOPLASTY Right 05/09/2014   Procedure: SHOULDER ACROMIOPLASTY;  Surgeon: [name], MD;  Location: [facility];  Service: Orthopedics;  Laterality: Right;  . SHOULDER ARTHROSCOPY WITH BICEPSTENOTOMY Right 05/09/2014   Procedure: SHOULDER ARTHROSCOPY WITH BICEPSTENOTOMY;  Surgeon: [name], MD;   Location: [facility];  Service: Orthopedics;  Laterality: Right;  Debridement Rotator Cuff Tear  . SHOULDER ARTHROSCOPY WITH ROTATOR CUFF REPAIR Right 12/19/2014   Procedure: RIGHT SHOULDER ARTHROSCOPY WITH ROTATOR CUFF REPAIR,CALCIFIC DEBRIDEMENT EXTENSIVE;  Surgeon: [name], MD;  Location: [facility];  Service: Orthopedics;  Laterality: Right;  . SHOULDER ARTHROSCOPY WITH ROTATOR CUFF REPAIR Right 07/10/2015   Procedure: RIGHT SHOULDER ARTHROSCOPY, DEBRIDEMENT  WITH ROTATOR CUFF REPAIR;  Surgeon: [name], MD;  Location: [facility];  Service: Orthopedics;  Laterality: Right;  . SHOULDER SURGERY     left  . TONSILLECTOMY AND ADENOIDECTOMY    . VARICOSE VEIN SURGERY    . WRIST ARTHROSCOPY Right 09/20/2013   Procedure: RIGHT ARTHROSCOPY WRIST EXCISION PISIFORM;  Surgeon: [name], MD;  Location:  SURGERY CENTER;  Service: Orthopedics;  Laterality: Right;    There were no vitals filed for this visit.      Subjective Assessment - 04/23/16 1110    Subjective Pt. reporting she felt fine following last treatment.    Patient Stated Goals to improve shoulder function   Currently in Pain? Yes   Pain Score 5    Pain Location Scapula   Pain Orientation Right  Pain Descriptors / Indicators Aching;Dull   Multiple Pain Sites No            [facility] PT Assessment - 04/23/16 1125      Assessment   Next MD Visit 2.6.18            [facility] Adult PT Treatment/Exercise - 04/23/16 1139      Shoulder Exercises: Seated   Retraction AROM;15 reps  5" hold with therapist tactile cueing for full ROM    External Rotation AAROM;Right;15 reps   External Rotation Limitations with cane    Flexion AAROM;Right;15 reps   Flexion Limitations cane - scapular plane - with mirror for visual feedback   Abduction AAROM;Right;15 reps   ABduction Limitations cane - scapular plane - with mirror for visual feedback     Shoulder Exercises:  Pulleys   Flexion 3 minutes   ABduction 3 minutes     Shoulder Exercises: Isometric Strengthening   External Rotation Supine   External Rotation Limitations 10x10" in scap. plane   Internal Rotation Supine  10x10"     Vasopneumatic   Number Minutes Vasopneumatic  15 minutes   Vasopnuematic Location  Shoulder   Vasopneumatic Pressure Medium   Vasopneumatic Temperature  coldest temp     Manual Therapy   Manual Therapy Passive ROM;Soft tissue mobilization;Myofascial release   Manual therapy comments STM/MFR & TPR to periscapular mm  (infraspintus, subscapularis & teres major/minor, Rhomboids)   Passive ROM flexion, abduction, ER and IR - all in scapular plane - continued tightness and pain at end range (5 x 15-30" holds at end range)                  PT Short Term Goals - 04/15/16 1649      PT SHORT TERM GOAL #1   Title patient to be independent with HEP (04/14/16)   Status Achieved     PT SHORT TERM GOAL #2   Title Patient to improve R shoulder PROM flexion to 120 and ER to 50 (in scapular plane) (04/14/16)   Status On-going           PT Long Term Goals - 04/15/16 1650      PT LONG TERM GOAL #1   Title patient to be independent with advanced HEP (05/12/16)   Status On-going     PT LONG TERM GOAL #2   Title Patient to improve AROM of R shoulder to 90 deg flexion (05/12/16)   Status On-going     PT LONG TERM GOAL #3   Title Patient to demonstrate proper postural alignment with ability to self correct (05/12/16)   Status On-going               Plan - 04/23/16 1125    Clinical Impression Statement Pt. reporting has felt fine following last treatment however pain still 5/10 range today.  Pt. with improved technique today with AAROM activities requiring less verbal cueing.  Mirror training continued today with seated flexion, abduction AAROM activities to discourage scap elevation.  TRP/STM continued today to R medial scap. border and posture shoulder.  Pt.  still very TTP in Teres Minor/Major area and inferior scapular angle.  Pt. will continue to benefit from skilled therapy to improve R shoulder strength and ROM to improve function.   PT Treatment/Interventions ADLs/Self Care Home Management;Cryotherapy;Electrical Stimulation;Moist Heat;Ultrasound;Therapeutic exercise;Therapeutic activities;Functional mobility training;Patient/family education;Manual techniques;Passive range of motion;Vasopneumatic Device;Taping;Dry needling   PT Next Visit Plan progress per protocol - conservatively; AAROM against gravity; modalities  prn for pain      Patient will benefit from skilled therapeutic intervention in order to improve the following deficits and impairments:  Decreased activity tolerance, Decreased range of motion, Decreased strength, Impaired UE functional use, Pain  Visit Diagnosis: Right shoulder pain, unspecified chronicity  Stiffness of right shoulder, not elsewhere classified  Abnormal posture  Muscle weakness (generalized)     Problem List Patient Active Problem List   Diagnosis Date Noted  . 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 Preop cardiovascular exam 07/09/2015  . Chest pain 05/09/2013  . Hypertension 02/23/2012  . Hyperlipidemia 02/23/2012  . Right shoulder pain 01/25/2012  . Left wrist pain 01/16/2012  . Neck pain 12/26/2011    Kermit Balo, PTA 04/23/16 12:10 PM  [facility] Outpatient Rehabilitation [facility] [address]  [address] [place], [place], [address] Phone: [phone]   Fax:  [phone]  Name: ANNETTE BERTELSON MRN: [id number] Date of Birth: [birth year]

## 2016-04-27 ENCOUNTER — Ambulatory Visit: Payer: Medicare Other

## 2016-04-27 DIAGNOSIS — M25611 Stiffness of right shoulder, not elsewhere classified: Secondary | ICD-10-CM

## 2016-04-27 DIAGNOSIS — M25511 Pain in right shoulder: Secondary | ICD-10-CM

## 2016-04-27 DIAGNOSIS — M6281 Muscle weakness (generalized): Secondary | ICD-10-CM

## 2016-04-27 DIAGNOSIS — R293 Abnormal posture: Secondary | ICD-10-CM

## 2016-04-27 NOTE — Therapy (Signed)
[facility] [address]  [address] [place], [place], [address] Phone: [phone]   Fax:  [phone]  Physical Therapy Treatment  Patient Details  Name: [name] [name] MRN: [id number] Date of Birth: [birth year] Referring Provider: Dr. [name]  Encounter Date: 04/27/2016      PT End of Session - 04/27/16 1102    Visit Number 7   Number of Visits 16   Date for PT Re-Evaluation 05/12/16   PT Start Time 1057  Pt. taken back early   PT Stop Time 1202   PT Time Calculation (min) 65 min   Activity Tolerance Patient tolerated treatment well   Behavior During Therapy [facility] for tasks assessed/performed      Past Medical History:  Diagnosis Date  . Allergy   . Anxiety   . Arthritis   . Asthma    Bronchial   . Celiac disease   . Full dentures   . Headache    migraines  . History of hiatal hernia   . Hyperlipidemia   . Hypertension   . IBS (irritable bowel syndrome)   . Pneumonia    "years ago"  . PONV (postoperative nausea and vomiting)   . Wears glasses    readers    Past Surgical History:  Procedure Laterality Date  . ABDOMINAL HYSTERECTOMY    . APPENDECTOMY    . BREAST LUMPECTOMY  1985   lt  . CARPAL TUNNEL RELEASE     rt  . CHOLECYSTECTOMY    . ELBOW SURGERY    . EYE SURGERY     both cataracts  . KNEE SURGERY     lt  . NASAL SEPTUM SURGERY    . REVERSE SHOULDER ARTHROPLASTY Right 02/18/2016   Procedure: RIGHT REVERSE TOTAL SHOULDER ARTHROPLASTY;  Surgeon: [name], MD;  [facility];  Service: Orthopedics;  Laterality: Right;  . REVERSE TOTAL SHOULDER ARTHROPLASTY Right 02/18/2016  . SHOULDER ACROMIOPLASTY Right 05/09/2014   Procedure: SHOULDER ACROMIOPLASTY;  Surgeon: [name], MD;  Location: [facility];  Service: Orthopedics;  Laterality: Right;  . SHOULDER ARTHROSCOPY WITH BICEPSTENOTOMY Right 05/09/2014   Procedure: SHOULDER ARTHROSCOPY WITH BICEPSTENOTOMY;   Surgeon: [name], MD;  Location: [facility];  Service: Orthopedics;  Laterality: Right;  Debridement Rotator Cuff Tear  . SHOULDER ARTHROSCOPY WITH ROTATOR CUFF REPAIR Right 12/19/2014   Procedure: RIGHT SHOULDER ARTHROSCOPY WITH ROTATOR CUFF REPAIR,CALCIFIC DEBRIDEMENT EXTENSIVE;  Surgeon: [name], MD;  Location: [facility];  Service: Orthopedics;  Laterality: Right;  . SHOULDER ARTHROSCOPY WITH ROTATOR CUFF REPAIR Right 07/10/2015   Procedure: RIGHT SHOULDER ARTHROSCOPY, DEBRIDEMENT  WITH ROTATOR CUFF REPAIR;  Surgeon: [name], MD;  Location: [facility];  Service: Orthopedics;  Laterality: Right;  . SHOULDER SURGERY     left  . TONSILLECTOMY AND ADENOIDECTOMY    . VARICOSE VEIN SURGERY    . WRIST ARTHROSCOPY Right 09/20/2013   Procedure: RIGHT ARTHROSCOPY WRIST EXCISION PISIFORM;  Surgeon: [name], MD;  Location: [facility];  Service: Orthopedics;  Laterality: Right;    There were no vitals filed for this visit.      Subjective Assessment - 04/27/16 1059    Subjective Pt. reporting pain has been unchanged over the weekend however she has been trying to, "use her arm more at home".    Patient Stated Goals to improve shoulder function   Currently in Pain?  Yes   Pain Score 4    Pain Location Scapula   Pain Orientation Right   Pain Descriptors / Indicators Aching;Dull   Pain Type Acute pain;Surgical pain   Pain Onset More than a month ago   Pain Frequency Constant   Multiple Pain Sites No          [facility] Adult PT Treatment/Exercise - 04/27/16 1128      Shoulder Exercises: Supine   Flexion AAROM;Right;10 reps  2 sets; 5" hold   Flexion Limitations cane     Shoulder Exercises: Seated   Retraction 10 reps;AROM  10 sec squeeze   External Rotation AAROM;Right;15 reps   External Rotation Limitations with cane    Flexion AAROM;Right;15 reps   Flexion Limitations cane - scapular plane - with  mirror for visual feedback   Abduction AAROM;Right;15 reps  2 sets; 2nd set with heavy tactile to avoid shoulder hike    ABduction Limitations cane - scapular plane - with mirror for visual feedback     Shoulder Exercises: Pulleys   Flexion 3 minutes   ABduction 3 minutes     Shoulder Exercises: Isometric Strengthening   External Rotation Supine   External Rotation Limitations 3 x 10 reps x 10" in verying angles in scap. plane    Internal Rotation Supine     Modalities   Modalities Electrical Stimulation;Vasopneumatic     Electrical Stimulation   Electrical Stimulation Location R posterior shoulder/periscapular muscles   Electrical Stimulation Action IFC    Electrical Stimulation Parameters to tolerance    Electrical Stimulation Goals Pain     Vasopneumatic   Number Minutes Vasopneumatic  15 minutes   Vasopnuematic Location  Shoulder   Vasopneumatic Pressure Medium   Vasopneumatic Temperature  coldest temp     Manual Therapy   Manual Therapy Passive ROM;Soft tissue mobilization;Myofascial release   Manual therapy comments STM/MFR & TPR to periscapular mm  (infraspintus, subscapularis & teres major/minor, Rhomboids)   Passive ROM flexion, abduction, ER and IR; all in scapular plane; continued tightness and pain at end range with extended hold times with gentle stretch in all directions 2' holds at end range            PT Short Term Goals - 04/15/16 1649      PT SHORT TERM GOAL #1   Title patient to be independent with HEP (04/14/16)   Status Achieved     PT SHORT TERM GOAL #2   Title Patient to improve R shoulder PROM flexion to 120 and ER to 50 (in scapular plane) (04/14/16)   Status On-going           PT Long Term Goals - 04/15/16 1650      PT LONG TERM GOAL #1   Title patient to be independent with advanced HEP (05/12/16)   Status On-going     PT LONG TERM GOAL #2   Title Patient to improve AROM of R shoulder to 90 deg flexion (05/12/16)   Status On-going      PT LONG TERM GOAL #3   Title Patient to demonstrate proper postural alignment with ability to self correct (05/12/16)   Status On-going               Plan - 04/27/16 1124    Clinical Impression Statement Pt. reporting R shoulder pain has been unchanged over the weekend despite trying to, "use arm more".  Today's treatment focused on AAROM wand activities with therapist providing verbal, tactile, and mirror  cueing to prevent substitutions.  Pt. demonstrating slight improvement with wand activities in seated position today however still with tendency toward scapular elevation with all scaption, flexion activities.  Significant time taken today with manual therapy for extended hold times with all PROM, and TPR/STM to posterior shoulder girdle.  Pt. still demonstrating significant muscular guarded with all PROM, AAROM activities.  Pt. reporting benefit from E-stim/Vaso combo thus this was applied to R shoulder to conclude treatment.     PT Treatment/Interventions ADLs/Self Care Home Management;Cryotherapy;Electrical Stimulation;Moist Heat;Ultrasound;Therapeutic exercise;Therapeutic activities;Functional mobility training;Patient/family education;Manual techniques;Passive range of motion;Vasopneumatic Device;Taping;Dry needling   PT Next Visit Plan progress per protocol - conservatively; AAROM against gravity; modalities prn for pain      Patient will benefit from skilled therapeutic intervention in order to improve the following deficits and impairments:  Decreased activity tolerance, Decreased range of motion, Decreased strength, Impaired UE functional use, Pain  Visit Diagnosis: Right shoulder pain, unspecified chronicity  Stiffness of right shoulder, not elsewhere classified  Abnormal posture  Muscle weakness (generalized)     Problem List Patient Active Problem List   Diagnosis Date Noted  . 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 Preop  cardiovascular exam 07/09/2015  . Chest pain 05/09/2013  . Hypertension 02/23/2012  . Hyperlipidemia 02/23/2012  . Right shoulder pain 01/25/2012  . Left wrist pain 01/16/2012  . Neck pain 12/26/2011    Kermit Balo, PTA 04/27/16 12:25 PM  [facility] Out[facility] [address]  [address] [place], [place], [address] Phone: [phone]   Fax:  [phone]  Name: [name] [name] MRN: [id number] Date of Birth: 04-22-1945

## 2016-04-29 ENCOUNTER — Ambulatory Visit: Payer: Medicare Other | Admitting: Physical Therapy

## 2016-04-30 ENCOUNTER — Ambulatory Visit: Payer: Medicare Other | Admitting: Physical Therapy

## 2016-05-04 ENCOUNTER — Ambulatory Visit: Payer: Medicare Other

## 2016-05-04 DIAGNOSIS — M25511 Pain in right shoulder: Secondary | ICD-10-CM

## 2016-05-04 DIAGNOSIS — M6281 Muscle weakness (generalized): Secondary | ICD-10-CM

## 2016-05-04 DIAGNOSIS — M25611 Stiffness of right shoulder, not elsewhere classified: Secondary | ICD-10-CM

## 2016-05-04 DIAGNOSIS — R293 Abnormal posture: Secondary | ICD-10-CM

## 2016-05-04 NOTE — Therapy (Signed)
[facility] [address]  [address] [place], [place], [address] Phone: [phone]   Fax:  [phone]  Physical Therapy Treatment  Patient Details  Name: [name] [name] MRN: [id number] Date of Birth: November 06, 1945 Referring Provider: Dr. Mckinley Jewel  Encounter Date: 05/04/2016      PT End of Session - 05/04/16 1119    Visit Number 8   Number of Visits 16   Date for PT Re-Evaluation 05/12/16   PT Start Time 1057  pt. taken back early   PT Stop Time 1203   PT Time Calculation (min) 66 min   Activity Tolerance Patient tolerated treatment well   Behavior During Therapy [facility] for tasks assessed/performed      Past Medical History:  Diagnosis Date  . Allergy   . Anxiety   . Arthritis   . Asthma    Bronchial   . Celiac disease   . Full dentures   . Headache    migraines  . History of hiatal hernia   . Hyperlipidemia   . Hypertension   . IBS (irritable bowel syndrome)   . Pneumonia    "years ago"  . PONV (postoperative nausea and vomiting)   . Wears glasses    readers    Past Surgical History:  Procedure Laterality Date  . ABDOMINAL HYSTERECTOMY    . APPENDECTOMY    . BREAST LUMPECTOMY  1985   lt  . CARPAL TUNNEL RELEASE     rt  . CHOLECYSTECTOMY    . ELBOW SURGERY    . EYE SURGERY     both cataracts  . KNEE SURGERY     lt  . NASAL SEPTUM SURGERY    . REVERSE SHOULDER ARTHROPLASTY Right 02/18/2016   Procedure: RIGHT REVERSE TOTAL SHOULDER ARTHROPLASTY;  Surgeon: Loreta Ave, MD;  Location: [facility] OR;  Service: Orthopedics;  Laterality: Right;  . REVERSE TOTAL SHOULDER ARTHROPLASTY Right 02/18/2016  . SHOULDER ACROMIOPLASTY Right 05/09/2014   Procedure: SHOULDER ACROMIOPLASTY;  Surgeon: Loreta Ave, MD;  Location: [facility];  Service: Orthopedics;  Laterality: Right;  . SHOULDER ARTHROSCOPY WITH BICEPSTENOTOMY Right 05/09/2014   Procedure: SHOULDER ARTHROSCOPY WITH BICEPSTENOTOMY;   Surgeon: Loreta Ave, MD;  Location: [facility];  Service: Orthopedics;  Laterality: Right;  Debridement Rotator Cuff Tear  . SHOULDER ARTHROSCOPY WITH ROTATOR CUFF REPAIR Right 12/19/2014   Procedure: RIGHT SHOULDER ARTHROSCOPY WITH ROTATOR CUFF REPAIR,CALCIFIC DEBRIDEMENT EXTENSIVE;  Surgeon: Loreta Ave, MD;  Location: [facility];  Service: Orthopedics;  Laterality: Right;  . SHOULDER ARTHROSCOPY WITH ROTATOR CUFF REPAIR Right 07/10/2015   Procedure: RIGHT SHOULDER ARTHROSCOPY, DEBRIDEMENT  WITH ROTATOR CUFF REPAIR;  Surgeon: Loreta Ave, MD;  Location: [facility];  Service: Orthopedics;  Laterality: Right;  . SHOULDER SURGERY     left  . TONSILLECTOMY AND ADENOIDECTOMY    . VARICOSE VEIN SURGERY    . WRIST ARTHROSCOPY Right 09/20/2013   Procedure: RIGHT ARTHROSCOPY WRIST EXCISION PISIFORM;  Surgeon: Wyn Forster, MD;  Location:  SURGERY CENTER;  Service: Orthopedics;  Laterality: Right;    There were no vitals filed for this visit.      Subjective Assessment - 05/04/16 1115    Subjective Pt. reporting pain unchanged, however she still wakes up frequenly in the night with pain.     Patient Stated Goals to improve shoulder function   Currently in Pain? Yes   Pain Score  4    Pain Location Scapula   Pain Orientation Right   Pain Descriptors / Indicators Aching;Dull   Pain Type Acute pain;Surgical pain   Pain Onset More than a month ago   Pain Frequency Constant   Aggravating Factors  HEP, dressing    Pain Relieving Factors ice, rest   Multiple Pain Sites No             [facility] Adult PT Treatment/Exercise - 05/04/16 1122      Shoulder Exercises: Seated   Retraction 10 reps;AROM  10 sec squeeze    External Rotation AAROM;Right;15 reps  5"    External Rotation Limitations with cane   Flexion AAROM;Right;15 reps   Flexion Limitations cane - scapular plane - with mirror for visual feedback   Abduction  AAROM;Right;15 reps   ABduction Limitations cane - scapular plane - with mirror for visual feedback     Shoulder Exercises: Standing   External Rotation AAROM;20 reps;Right  cane; mirror feed back used    Flexion AAROM;Right;20 reps  mirror feed back used; leaning on wall    Flexion Limitations cane    ABduction AAROM;20 reps;Right  mirror feedback used; leaning on wall   ABduction Limitations cane      Shoulder Exercises: Pulleys   Flexion 3 minutes  noodle removed due to LBP   ABduction 3 minutes  noodle removed due to LBP      Modalities   Modalities Electrical Stimulation;Vasopneumatic     Electrical Stimulation   Electrical Stimulation Location R medial scapular border   Electrical Stimulation Action IFC    Electrical Stimulation Parameters to tolerance   Electrical Stimulation Goals Pain     Vasopneumatic   Number Minutes Vasopneumatic  15 minutes   Vasopnuematic Location  Shoulder   Vasopneumatic Pressure Low   Vasopneumatic Temperature  coldest temp     Manual Therapy   Manual Therapy Passive ROM;Soft tissue mobilization;Myofascial release   Manual therapy comments STM/MFR & TPR to periscapular mm  (infraspintus, subscapularis & teres major/minor, Rhomboids)   Passive ROM flexion, abduction, ER and IR; all in scapular plane; continued tightness and pain at end range with extended hold times with moderate stretch in all directions; pt. verbalizing appropriateness of stretch with 5/10 pain report                   PT Short Term Goals - 04/15/16 1649      PT SHORT TERM GOAL #1   Title patient to be independent with HEP (04/14/16)   Status Achieved     PT SHORT TERM GOAL #2   Title Patient to improve R shoulder PROM flexion to 120 and ER to 50 (in scapular plane) (04/14/16)   Status On-going           PT Long Term Goals - 04/15/16 1650      PT LONG TERM GOAL #1   Title patient to be independent with advanced HEP (05/12/16)   Status On-going      PT LONG TERM GOAL #2   Title Patient to improve AROM of R shoulder to 90 deg flexion (05/12/16)   Status On-going     PT LONG TERM GOAL #3   Title Patient to demonstrate proper postural alignment with ability to self correct (05/12/16)   Status On-going               Plan - 05/04/16 1153    Clinical Impression Statement Significant time taken today with manual  stretching and TPR/STM to posterior shoulder musculature to improve ROM.  Extended hold times with all R shoulder stretches with pt. confirming appropriateness of stretch with 5/10 pain report at end range.  Pt. AAROM wand activities improved today with fewer substitutions with flexion, and abduction motions however mirror training still required for this.  Pt. still very TTP along scapular border and Teres Minor areas with STM/TPR today.  Pt. still demonstrating very limited glenohumeral rythm with all R shoulder motions in therapy despite ongoing focus to prevent substitutions.  ROM not measured today.  Pt. will continue to benefit from further skilled therapy to improve R shoulder ROM and function.     PT Treatment/Interventions ADLs/Self Care Home Management;Cryotherapy;Electrical Stimulation;Moist Heat;Ultrasound;Therapeutic exercise;Therapeutic activities;Functional mobility training;Patient/family education;Manual techniques;Passive range of motion;Vasopneumatic Device;Taping;Dry needling   PT Next Visit Plan progress per protocol - conservatively; mirror training with wand activities; AAROM against gravity; modalities prn for pain      Patient will benefit from skilled therapeutic intervention in order to improve the following deficits and impairments:  Decreased activity tolerance, Decreased range of motion, Decreased strength, Impaired UE functional use, Pain  Visit Diagnosis: Right shoulder pain, unspecified chronicity  Stiffness of right shoulder, not elsewhere classified  Abnormal posture  Muscle weakness  (generalized)     Problem List Patient Active Problem List   Diagnosis Date Noted  . 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 Preop cardiovascular exam 07/09/2015  . Chest pain 05/09/2013  . Hypertension 02/23/2012  . Hyperlipidemia 02/23/2012  . Right shoulder pain 01/25/2012  . Left wrist pain 01/16/2012  . Neck pain 12/26/2011   Kermit BaloMicah Nusaiba Guallpa, PTA 05/04/16 12:31 PM  [facility] [address]  [address] [place], [place], [address] Phone: [phone]   Fax:  [phone]  Name: [name] [name] MRN: [id number] Date of Birth: Aug 20, 1945

## 2016-05-06 ENCOUNTER — Ambulatory Visit: Payer: Medicare Other | Admitting: Physical Therapy

## 2016-05-06 ENCOUNTER — Ambulatory Visit: Payer: Medicare Other

## 2016-05-06 DIAGNOSIS — M25511 Pain in right shoulder: Secondary | ICD-10-CM

## 2016-05-06 DIAGNOSIS — M25611 Stiffness of right shoulder, not elsewhere classified: Secondary | ICD-10-CM

## 2016-05-06 DIAGNOSIS — M6281 Muscle weakness (generalized): Secondary | ICD-10-CM

## 2016-05-06 DIAGNOSIS — R293 Abnormal posture: Secondary | ICD-10-CM

## 2016-05-06 NOTE — Therapy (Signed)
[facility] [address]  [address] [place], [place], [address] Phone: [phone]   Fax:  [phone]  Physical Therapy Treatment  Patient Details  Name: [name] [name] MRN: [id number] Date of Birth: 20-Nov-1945 Referring Provider: Dr. Mckinley Jewel  Encounter Date: 05/06/2016      PT End of Session - 05/06/16 1311    Visit Number 9   Number of Visits 16   Date for PT Re-Evaluation 05/12/16   PT Start Time 1106   PT Stop Time 1214   PT Time Calculation (min) 68 min   Activity Tolerance Patient tolerated treatment well   Behavior During Therapy [facility] for tasks assessed/performed      Past Medical History:  Diagnosis Date  . Allergy   . Anxiety   . Arthritis   . Asthma    Bronchial   . Celiac disease   . Full dentures   . Headache    migraines  . History of hiatal hernia   . Hyperlipidemia   . Hypertension   . IBS (irritable bowel syndrome)   . Pneumonia    "years ago"  . PONV (postoperative nausea and vomiting)   . Wears glasses    readers    Past Surgical History:  Procedure Laterality Date  . ABDOMINAL HYSTERECTOMY    . APPENDECTOMY    . BREAST LUMPECTOMY  1985   lt  . CARPAL TUNNEL RELEASE     rt  . CHOLECYSTECTOMY    . ELBOW SURGERY    . EYE SURGERY     both cataracts  . KNEE SURGERY     lt  . NASAL SEPTUM SURGERY    . REVERSE SHOULDER ARTHROPLASTY Right 02/18/2016   Procedure: RIGHT REVERSE TOTAL SHOULDER ARTHROPLASTY;  Surgeon: Loreta Ave, MD;  Location: [facility] OR;  Service: Orthopedics;  Laterality: Right;  . REVERSE TOTAL SHOULDER ARTHROPLASTY Right 02/18/2016  . SHOULDER ACROMIOPLASTY Right 05/09/2014   Procedure: SHOULDER ACROMIOPLASTY;  Surgeon: Loreta Ave, MD;  Location: [facility];  Service: Orthopedics;  Laterality: Right;  . SHOULDER ARTHROSCOPY WITH BICEPSTENOTOMY Right 05/09/2014   Procedure: SHOULDER ARTHROSCOPY WITH BICEPSTENOTOMY;  Surgeon: Loreta Ave, MD;   Location: [facility];  Service: Orthopedics;  Laterality: Right;  Debridement Rotator Cuff Tear  . SHOULDER ARTHROSCOPY WITH ROTATOR CUFF REPAIR Right 12/19/2014   Procedure: RIGHT SHOULDER ARTHROSCOPY WITH ROTATOR CUFF REPAIR,CALCIFIC DEBRIDEMENT EXTENSIVE;  Surgeon: Loreta Ave, MD;  Location: [facility];  Service: Orthopedics;  Laterality: Right;  . SHOULDER ARTHROSCOPY WITH ROTATOR CUFF REPAIR Right 07/10/2015   Procedure: RIGHT SHOULDER ARTHROSCOPY, DEBRIDEMENT  WITH ROTATOR CUFF REPAIR;  Surgeon: Loreta Ave, MD;  Location: [facility];  Service: Orthopedics;  Laterality: Right;  . SHOULDER SURGERY     left  . TONSILLECTOMY AND ADENOIDECTOMY    . VARICOSE VEIN SURGERY    . WRIST ARTHROSCOPY Right 09/20/2013   Procedure: RIGHT ARTHROSCOPY WRIST EXCISION PISIFORM;  Surgeon: Wyn Forster, MD;  Location: [facility];  Service: Orthopedics;  Laterality: Right;    There were no vitals filed for this visit.      Subjective Assessment - 05/06/16 1309    Subjective Patient with continued reports of shoulder scapular pain. - continues to have excellent compliance with HEP   Patient Stated Goals to improve shoulder function   Currently in Pain? Yes   Pain Score 5    Pain Location  Shoulder   Pain Orientation Right   Pain Descriptors / Indicators Aching;Dull;Tightness   Pain Type Acute pain;Surgical pain   Pain Onset More than a month ago   Pain Frequency Constant   Aggravating Factors  HEP, dressing   Pain Relieving Factors ise, rest            [facility] PT Assessment - 05/06/16 0001      PROM   Right Shoulder Flexion 134 Degrees   Right Shoulder ABduction 133 Degrees   Right Shoulder External Rotation 60 Degrees                     [facility] Adult PT Treatment/Exercise - 05/06/16 0001      Shoulder Exercises: Seated   Retraction 10 reps   Flexion AAROM;Right;10 reps   Flexion Limitations cane -  scapular plane - with mirror for visual feedback   Abduction AAROM;Right;10 reps   ABduction Limitations cane - scapular plane - with mirror for visual feedback   Other Seated Exercises seated AAROM into flexion and abduction x 10 reps each with PT assist - AAROM until able with PT overpressure into further range for stretch with 5-10 second hold     Shoulder Exercises: Pulleys   Flexion 3 minutes  standing   ABduction 3 minutes  standing     Modalities   Modalities Electrical Stimulation;Vasopneumatic     Electrical Stimulation   Electrical Stimulation Location R UT, R lateral scap border, R deltoid area, anterior shoulder   Electrical Stimulation Action IFC   Electrical Stimulation Parameters to tolerance   Electrical Stimulation Goals Pain     Vasopneumatic   Number Minutes Vasopneumatic  15 minutes   Vasopnuematic Location  Shoulder   Vasopneumatic Pressure Medium   Vasopneumatic Temperature  coldest temp     Manual Therapy   Manual Therapy Soft tissue mobilization;Passive ROM;Scapular mobilization   Soft tissue mobilization STM to periscpaular mm for reduced pain and tissue tightness   Scapular Mobilization seated scapular mobilization - PT led 10 reps   Passive ROM Flexion, abd, ER - supine in the scapular plane - 10 reps x 10" hold each - ER with contract relax stretching                  PT Short Term Goals - 04/15/16 1649      PT SHORT TERM GOAL #1   Title patient to be independent with HEP (04/14/16)   Status Achieved     PT SHORT TERM GOAL #2   Title Patient to improve R shoulder PROM flexion to 120 and ER to 50 (in scapular plane) (04/14/16)   Status On-going           PT Long Term Goals - 04/15/16 1650      PT LONG TERM GOAL #1   Title patient to be independent with advanced HEP (05/12/16)   Status On-going     PT LONG TERM GOAL #2   Title Patient to improve AROM of R shoulder to 90 deg flexion (05/12/16)   Status On-going     PT LONG TERM  GOAL #3   Title Patient to demonstrate proper postural alignment with ability to self correct (05/12/16)   Status On-going               Plan - 05/06/16 1724    Clinical Impression Statement [place] doing well today - continued pain and frustration with slow progress. Patient with improve PROM today as compared  to time at initial evaluation with good ROM ellicited with contract-relax stretching. Patient conitnues to demonstrate heavy compensation adn reduced range with all AAROM. PT assisting with AAROM until end range with added PROM into further range in gravity dependent positions. Patient to continue to benefit from PT to maximize functional use of R UE.    PT Treatment/Interventions ADLs/Self Care Home Management;Cryotherapy;Electrical Stimulation;Moist Heat;Ultrasound;Therapeutic exercise;Therapeutic activities;Functional mobility training;Patient/family education;Manual techniques;Passive range of motion;Vasopneumatic Device;Taping;Dry needling   PT Next Visit Plan progress per protocol - conservatively; mirror training with wand activities; AAROM against gravity; modalities prn for pain   Consulted and Agree with Plan of Care Patient      Patient will benefit from skilled therapeutic intervention in order to improve the following deficits and impairments:  Decreased activity tolerance, Decreased range of motion, Decreased strength, Impaired UE functional use, Pain  Visit Diagnosis: Right shoulder pain, unspecified chronicity  Stiffness of right shoulder, not elsewhere classified  Abnormal posture  Muscle weakness (generalized)     Problem List Patient Active Problem List   Diagnosis Date Noted  . 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 Preop cardiovascular exam 07/09/2015  . Chest pain 05/09/2013  . Hypertension 02/23/2012  . Hyperlipidemia 02/23/2012  . Right shoulder pain 01/25/2012  . Left wrist pain 01/16/2012  . Neck pain  12/26/2011    Kipp LaurenceStephanie R Mariabella Nilsen, PT, DPT 05/06/16 5:31 PM   [facility] [address]  [address] [place], [place], [address] Phone: [phone]   Fax:  [phone]  Name: Wellington HampshireLynn C Schneider MRN: [id number] Date of Birth: 1946/01/16

## 2016-05-07 ENCOUNTER — Ambulatory Visit: Payer: Medicare Other | Admitting: Physical Therapy

## 2016-05-11 ENCOUNTER — Ambulatory Visit: Payer: Medicare Other | Admitting: Physical Therapy

## 2016-05-11 DIAGNOSIS — M25511 Pain in right shoulder: Secondary | ICD-10-CM | POA: Diagnosis not present

## 2016-05-11 DIAGNOSIS — M6281 Muscle weakness (generalized): Secondary | ICD-10-CM

## 2016-05-11 DIAGNOSIS — R293 Abnormal posture: Secondary | ICD-10-CM

## 2016-05-11 DIAGNOSIS — M25611 Stiffness of right shoulder, not elsewhere classified: Secondary | ICD-10-CM

## 2016-05-11 NOTE — Therapy (Signed)
[name] [name] [name] [name] [address]  [address] [name], [place], [address] Phone: [phone]   Fax:  [phone]  Physical Therapy Treatment  Patient Details  Name: [name] HampshireLynn C Rouch MRN: [id number] Date of Birth: 03-11-1946 Referring Provider: Dr. Mckinley Jewelaniel [name]  Encounter Date: 05/11/2016      PT End of Session - 05/11/16 1158    Visit Number 10   Number of Visits 28   Date for PT Re-Evaluation 07/09/16   PT Start Time 1056  heat prior to treatment   PT Stop Time 1205   PT Time Calculation (min) 69 min   Activity Tolerance Patient tolerated treatment well   Behavior During Therapy [facility] for tasks assessed/performed      Past Medical History:  Diagnosis Date  . Allergy   . Anxiety   . Arthritis   . Asthma    Bronchial   . Celiac disease   . Full dentures   . Headache    migraines  . History of hiatal hernia   . Hyperlipidemia   . Hypertension   . IBS (irritable bowel syndrome)   . Pneumonia    "years ago"  . PONV (postoperative nausea and vomiting)   . Wears glasses    readers    Past Surgical History:  Procedure Laterality Date  . ABDOMINAL HYSTERECTOMY    . APPENDECTOMY    . BREAST LUMPECTOMY  1985   lt  . CARPAL TUNNEL RELEASE     rt  . CHOLECYSTECTOMY    . ELBOW SURGERY    . EYE SURGERY     both cataracts  . KNEE SURGERY     lt  . NASAL SEPTUM SURGERY    . REVERSE SHOULDER ARTHROPLASTY Right 02/18/2016   Procedure: RIGHT REVERSE TOTAL SHOULDER ARTHROPLASTY;  Surgeon: [name] Aveaniel F Murphy, MD;  [facility];  Service: Orthopedics;  Laterality: Right;  . REVERSE TOTAL SHOULDER ARTHROPLASTY Right 02/18/2016  . SHOULDER ACROMIOPLASTY Right 05/09/2014   Procedure: SHOULDER ACROMIOPLASTY;  Surgeon: [name] Aveaniel F Murphy, MD;  Location: [facility];  Service: Orthopedics;  Laterality: Right;  . SHOULDER ARTHROSCOPY WITH BICEPSTENOTOMY Right 05/09/2014   Procedure: SHOULDER ARTHROSCOPY WITH BICEPSTENOTOMY;   Surgeon: [name] Aveaniel F Murphy, MD;  Location: [facility];  Service: Orthopedics;  Laterality: Right;  Debridement Rotator Cuff Tear  . SHOULDER ARTHROSCOPY WITH ROTATOR CUFF REPAIR Right 12/19/2014   Procedure: RIGHT SHOULDER ARTHROSCOPY WITH ROTATOR CUFF REPAIR,CALCIFIC DEBRIDEMENT EXTENSIVE;  Surgeon: [name] Aveaniel F Murphy, MD;  Location: [facility];  Service: Orthopedics;  Laterality: Right;  . SHOULDER ARTHROSCOPY WITH ROTATOR CUFF REPAIR Right 07/10/2015   Procedure: RIGHT SHOULDER ARTHROSCOPY, DEBRIDEMENT  WITH ROTATOR CUFF REPAIR;  Surgeon: [name] Aveaniel F Murphy, MD;  Location: [facility];  Service: Orthopedics;  Laterality: Right;  . SHOULDER SURGERY     left  . TONSILLECTOMY AND ADENOIDECTOMY    . VARICOSE VEIN SURGERY    . WRIST ARTHROSCOPY Right 09/20/2013   Procedure: RIGHT ARTHROSCOPY WRIST EXCISION PISIFORM;  Surgeon: [name] Forsterobert Sypher Jr V, MD;  Location: [facility];  Service: Orthopedics;  Laterality: Right;    There were no vitals filed for this visit.      Subjective Assessment - 05/11/16 1103    Subjective Continued reports of tightness - good HEP compliance   Patient Stated Goals to improve shoulder function   Currently in Pain? Yes   Pain Score 4    Pain Location Shoulder  Pain Orientation Right   Pain Descriptors / Indicators Constant;Nagging   Pain Type Acute pain;Surgical pain   Pain Onset More than a month ago   Pain Frequency Constant   Aggravating Factors  HEP, dressing            [facility] PT Assessment - 05/11/16 0001      PROM   Right Shoulder Flexion 141 Degrees  scapular plane   Right Shoulder ABduction 136 Degrees  scapular plane   Right Shoulder External Rotation 60 Degrees                     [facility] Adult PT Treatment/Exercise - 05/11/16 0001      Shoulder Exercises: Seated   Flexion AAROM;Right;10 reps   Flexion Limitations cane - scapular plane - with mirror for visual feedback    Abduction AAROM;Right;10 reps   ABduction Limitations cane - scapular plane - with mirror for visual feedback     Shoulder Exercises: Standing   Flexion AAROM;Right;10 reps   Flexion Limitations counter walkouts - body weight for increased range   ABduction AAROM;Right;10 reps   ABduction Limitations wall ladder     Modalities   Modalities Electrical Stimulation;Vasopneumatic;Moist Heat     Moist Heat Therapy   Number Minutes Moist Heat 10 Minutes   Moist Heat Location Shoulder  R - prior to treatment     Electrical Stimulation   Electrical Stimulation Location R UT, R lateral scap border, R deltoid area, anterior shoulder   Electrical Stimulation Action IFC   Electrical Stimulation Parameters to tolerance   Electrical Stimulation Goals Pain     Vasopneumatic   Number Minutes Vasopneumatic  15 minutes   Vasopnuematic Location  Shoulder   Vasopneumatic Pressure Medium   Vasopneumatic Temperature  coldest temp     Manual Therapy   Manual Therapy Soft tissue mobilization;Myofascial release;Scapular mobilization;Passive ROM   Soft tissue mobilization STM to periscpaular mm for reduced pain and tissue tightness   Myofascial Release manual trigger poiint release to R teres minor/major, lats   Scapular Mobilization L sidelying scapular mobilization with scap squeeze x 10   Passive ROM Flexion, abd, ER - supine in the scapular plane - 10 reps x 10" hold each - all with contract-relax with ability to achieve increased PROM from last visit                  PT Short Term Goals - 05/11/16 1239      PT SHORT TERM GOAL #1   Title patient to be independent with HEP (04/14/16)   Status Achieved     PT SHORT TERM GOAL #2   Title Patient to improve R shoulder PROM flexion to 120 and ER to 50 (in scapular plane) (04/14/16)   Status Achieved           PT Long Term Goals - 05/11/16 1239      PT LONG TERM GOAL #1   Title patient to be independent with advanced HEP (05/12/16)    Status On-going     PT LONG TERM GOAL #2   Title Patient to improve AROM of R shoulder to 90 deg flexion (05/12/16)   Status On-going     PT LONG TERM GOAL #3   Title Patient to demonstrate proper postural alignment with ability to self correct (05/12/16)   Status On-going               Plan - 05/11/16 1244    Clinical  Impression Statement Patient doing well today. Continues to report overall tightness as well as pain in shoulder/scapular area. Patient tolerating all stretching today with stretching focusing on contract-relax for flexion, abduction, and ER with ability to achieve increased PROM with this technique. Patient with continued compensation with all active assist activities with VC needed to reduce. Patient to continue to benefit from PT to maximize functional use of R UE.    PT Treatment/Interventions ADLs/Self Care Home Management;Cryotherapy;Electrical Stimulation;Moist Heat;Ultrasound;Therapeutic exercise;Therapeutic activities;Functional mobility training;Patient/family education;Manual techniques;Passive range of motion;Vasopneumatic Device;Taping;Dry needling   PT Next Visit Plan progress per protocol - conservatively; AAROM against gravity; modalities prn for pain   Consulted and Agree with Plan of Care Patient      Patient will benefit from skilled therapeutic intervention in order to improve the following deficits and impairments:  Decreased activity tolerance, Decreased range of motion, Decreased strength, Impaired UE functional use, Pain  Visit Diagnosis: Right shoulder pain, unspecified chronicity - Plan: PT plan of care cert/re-cert  Stiffness of right shoulder, not elsewhere classified - Plan: PT plan of care cert/re-cert  Abnormal posture - Plan: PT plan of care cert/re-cert  Muscle weakness (generalized) - Plan: PT plan of care cert/re-cert       G-Codes - 05-Jun-2016 1246    Functional Assessment Tool Used FOTO: 44 (56% limited)   Functional  Limitation Carrying, moving and handling objects   Carrying, Moving and Handling Objects Current Status (Z6109) At least 40 percent but less than 60 percent impaired, limited or restricted   Carrying, Moving and Handling Objects Goal Status (U0454) At least 40 percent but less than 60 percent impaired, limited or restricted      Problem List Patient Active Problem List   Diagnosis Date Noted  . 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 Preop cardiovascular exam 07/09/2015  . Chest pain 05/09/2013  . Hypertension 02/23/2012  . Hyperlipidemia 02/23/2012  . Right shoulder pain 01/25/2012  . Left wrist pain 01/16/2012  . Neck pain 12/26/2011    Kipp Laurence, PT, DPT 06-05-16 12:49 PM   [facility] [address]  [address] [place], [place], [address] Phone: [phone]   Fax:  [phone]  Name: FLONNIE WIERMAN MRN: [id number] Date of Birth: 1945-07-25

## 2016-05-13 ENCOUNTER — Ambulatory Visit: Payer: Medicare Other | Attending: Orthopedic Surgery

## 2016-05-13 ENCOUNTER — Ambulatory Visit: Payer: Medicare Other | Admitting: Physical Therapy

## 2016-05-13 DIAGNOSIS — M25511 Pain in right shoulder: Secondary | ICD-10-CM

## 2016-05-13 DIAGNOSIS — R293 Abnormal posture: Secondary | ICD-10-CM | POA: Insufficient documentation

## 2016-05-13 DIAGNOSIS — M25611 Stiffness of right shoulder, not elsewhere classified: Secondary | ICD-10-CM | POA: Insufficient documentation

## 2016-05-13 DIAGNOSIS — M6281 Muscle weakness (generalized): Secondary | ICD-10-CM | POA: Insufficient documentation

## 2016-05-13 NOTE — Therapy (Signed)
[facility] [address]  [address] [place], [place], [address] Phone: [phone]   Fax:  [phone]  Physical Therapy Treatment  Patient Details  Name: [name] HampshireLynn C [name] MRN: [id number] Date of Birth: 06/05/1945 Referring Provider: Dr. [name]  Encounter Date: 05/13/2016      PT End of Session - 05/13/16 1112    Visit Number 11   Number of Visits 28   Date for PT Re-Evaluation 07/09/16   PT Start Time 1015  heat prior to treatment    PT Stop Time 1116   PT Time Calculation (min) 61 min   Activity Tolerance Patient tolerated treatment well   Behavior During Therapy [facility] for tasks assessed/performed      Past Medical History:  Diagnosis Date  . Allergy   . Anxiety   . Arthritis   . Asthma    Bronchial   . Celiac disease   . Full dentures   . Headache    migraines  . History of hiatal hernia   . Hyperlipidemia   . Hypertension   . IBS (irritable bowel syndrome)   . Pneumonia    "years ago"  . PONV (postoperative nausea and vomiting)   . Wears glasses    readers    Past Surgical History:  Procedure Laterality Date  . ABDOMINAL HYSTERECTOMY    . APPENDECTOMY    . BREAST LUMPECTOMY  1985   lt  . CARPAL TUNNEL RELEASE     rt  . CHOLECYSTECTOMY    . ELBOW SURGERY    . EYE SURGERY     both cataracts  . KNEE SURGERY     lt  . NASAL SEPTUM SURGERY    . REVERSE SHOULDER ARTHROPLASTY Right 02/18/2016   Procedure: RIGHT REVERSE TOTAL SHOULDER ARTHROPLASTY;  Surgeon: [name], MD;  [facility];  Service: Orthopedics;  Laterality: Right;  . REVERSE TOTAL SHOULDER ARTHROPLASTY Right 02/18/2016  . SHOULDER ACROMIOPLASTY Right 05/09/2014   Procedure: SHOULDER ACROMIOPLASTY;  Surgeon: [name], MD;  Location: [facility];  Service: Orthopedics;  Laterality: Right;  . SHOULDER ARTHROSCOPY WITH BICEPSTENOTOMY Right 05/09/2014   Procedure: SHOULDER ARTHROSCOPY WITH BICEPSTENOTOMY;   Surgeon: [name], MD;  Location: [facility];  Service: Orthopedics;  Laterality: Right;  Debridement Rotator Cuff Tear  . SHOULDER ARTHROSCOPY WITH ROTATOR CUFF REPAIR Right 12/19/2014   Procedure: RIGHT SHOULDER ARTHROSCOPY WITH ROTATOR CUFF REPAIR,CALCIFIC DEBRIDEMENT EXTENSIVE;  Surgeon: [name], MD;  Location: [facility];  Service: Orthopedics;  Laterality: Right;  . SHOULDER ARTHROSCOPY WITH ROTATOR CUFF REPAIR Right 07/10/2015   Procedure: RIGHT SHOULDER ARTHROSCOPY, DEBRIDEMENT  WITH ROTATOR CUFF REPAIR;  Surgeon: [name], MD;  Location: [facility];  Service: Orthopedics;  Laterality: Right;  . SHOULDER SURGERY     left  . TONSILLECTOMY AND ADENOIDECTOMY    . VARICOSE VEIN SURGERY    . WRIST ARTHROSCOPY Right 09/20/2013   Procedure: RIGHT ARTHROSCOPY WRIST EXCISION PISIFORM;  Surgeon: [name], MD;  Location: [facility];  Service: Orthopedics;  Laterality: Right;    There were no vitals filed for this visit.      Subjective Assessment - 05/13/16 1018    Subjective Pt. reporting continued tightness of R shoulder.   Patient Stated Goals to improve shoulder function   Currently in Pain? Yes   Pain Score 4    Pain Location Shoulder  Pain Orientation Right   Pain Descriptors / Indicators Constant;Nagging   Pain Type Acute pain   Pain Onset More than a month ago   Pain Frequency Constant   Multiple Pain Sites No           [facility] Adult PT Treatment/Exercise - 05/13/16 1050      Shoulder Exercises: Seated   Retraction 10 reps  10" hold    External Rotation AAROM;Right;15 reps   External Rotation Limitations with cane; and mirror   Flexion AAROM;Right;10 reps   Flexion Limitations cane - scapular plane - with mirror for visual feedback   Abduction AAROM;Right;10 reps   ABduction Limitations cane - scapular plane - with mirror for visual feedback     Shoulder Exercises: Standing    Flexion AAROM;Right;15 reps   Flexion Limitations Wall Ladder; tactile cueing to avoid excessive scap. elevation   ABduction AAROM;Right;15 reps   ABduction Limitations wall ladder; tactile cueing to avoid excessive scap. elevation     Shoulder Exercises: Pulleys   Flexion 3 minutes  standing    ABduction 3 minutes  standing      Moist Heat Therapy   Number Minutes Moist Heat 10 Minutes   Moist Heat Location Shoulder  R - Prior to treatment     Electrical Stimulation   Electrical Stimulation Location R anterior shoulder, R posterior shoulder   Electrical Stimulation Action IFC   Electrical Stimulation Parameters to tolerance; 15 min    Electrical Stimulation Goals Pain     Vasopneumatic   Number Minutes Vasopneumatic  15 minutes   Vasopnuematic Location  Shoulder   Vasopneumatic Pressure Medium   Vasopneumatic Temperature  coldest temp     Manual Therapy   Manual Therapy Soft tissue mobilization;Myofascial release;Scapular mobilization;Passive ROM   Soft tissue mobilization STM to R UT, Teres Major/Minor for reduced pain and tissue tightness   Myofascial Release manual trigger poiint release to R teres minor/major, medial scap. border   Scapular Mobilization L sidelying scapular mobilization all directions    Passive ROM Flexion, abd, ER - supine in the scapular plane - prolonged holds for greater ROM           PT Short Term Goals - 05/11/16 1239      PT SHORT TERM GOAL #1   Title patient to be independent with HEP (04/14/16)   Status Achieved     PT SHORT TERM GOAL #2   Title Patient to improve R shoulder PROM flexion to 120 and ER to 50 (in scapular plane) (04/14/16)   Status Achieved           PT Long Term Goals - 05/11/16 1239      PT LONG TERM GOAL #1   Title patient to be independent with advanced HEP (05/12/16)   Status On-going     PT LONG TERM GOAL #2   Title Patient to improve AROM of R shoulder to 90 deg flexion (05/12/16)   Status On-going     PT  LONG TERM GOAL #3   Title Patient to demonstrate proper postural alignment with ability to self correct (05/12/16)   Status On-going               Plan - 05/13/16 1220    Clinical Impression Statement Today's visit with heavy focus on manual STM, TPR, and PROM with prolonged holds for improved shoulder ROM.  Pt. continues to be guarded with all PROM/AAROM in treatment.  Aggressive STM/TPR to R UT, Teres Minor/Major, and  medial scap. border to promote tissue relaxation today.  Pt. still requiring tactile cueing with AAROM wand and wall ladder to avoid excessive scapular elevation.  Moist heat applied to R shoulder before treatment today to promote relaxation with treatment concluding with ice/compression, and E-stim to reduce pain.  Pt. will continue to benefit from further skilled therapy to improve UE strength, ROM, and functional capacity.     PT Treatment/Interventions ADLs/Self Care Home Management;Cryotherapy;Electrical Stimulation;Moist Heat;Ultrasound;Therapeutic exercise;Therapeutic activities;Functional mobility training;Patient/family education;Manual techniques;Passive range of motion;Vasopneumatic Device;Taping;Dry needling   PT Next Visit Plan Measure ROM for MD f/u on 2.6.18; progress per protocol - conservatively; AAROM against gravity; modalities prn for pain      Patient will benefit from skilled therapeutic intervention in order to improve the following deficits and impairments:  Decreased activity tolerance, Decreased range of motion, Decreased strength, Impaired UE functional use, Pain  Visit Diagnosis: Right shoulder pain, unspecified chronicity  Stiffness of right shoulder, not elsewhere classified  Abnormal posture  Muscle weakness (generalized)     Problem List Patient Active Problem List   Diagnosis Date Noted  . 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 Preop cardiovascular exam 07/09/2015  . Chest pain 05/09/2013  .  Hypertension 02/23/2012  . Hyperlipidemia 02/23/2012  . Right shoulder pain 01/25/2012  . Left wrist pain 01/16/2012  . Neck pain 12/26/2011    Kermit Balo, PTA 05/13/16 12:39 PM  [facility] [address]  [address] [place], [place], [address] Phone: [phone]   Fax:  [phone]  Name: GRACYNN RAJEWSKI MRN: [id number] Date of Birth: 1945-12-28

## 2016-05-14 ENCOUNTER — Ambulatory Visit: Payer: Medicare Other | Admitting: Physical Therapy

## 2016-05-17 ENCOUNTER — Ambulatory Visit: Payer: Medicare Other | Admitting: Physical Therapy

## 2016-05-17 DIAGNOSIS — M25511 Pain in right shoulder: Secondary | ICD-10-CM | POA: Diagnosis not present

## 2016-05-17 DIAGNOSIS — R293 Abnormal posture: Secondary | ICD-10-CM

## 2016-05-17 DIAGNOSIS — M6281 Muscle weakness (generalized): Secondary | ICD-10-CM

## 2016-05-17 DIAGNOSIS — M25611 Stiffness of right shoulder, not elsewhere classified: Secondary | ICD-10-CM

## 2016-05-18 NOTE — Therapy (Signed)
[facility] [address]  [address] [place], [place], [address] Phone: [phone]   Fax:  [phone]  Physical Therapy Treatment  Patient Details  Name: [name] [name] MRN: [id number] Date of Birth: [birth year] Referring Provider: Dr. Mckinley Jewel  Encounter Date: 05/17/2016      PT End of Session - 05/17/16 1116    Visit Number 12   Number of Visits 28   Date for PT Re-Evaluation 07/09/16   PT Start Time 1005  10 minutes prior to treatment for moist heat   PT Stop Time 1118   PT Time Calculation (min) 73 min   Activity Tolerance Patient tolerated treatment well   Behavior During Therapy [facility] for tasks assessed/performed      Past Medical History:  Diagnosis Date  . Allergy   . Anxiety   . Arthritis   . Asthma    Bronchial   . Celiac disease   . Full dentures   . Headache    migraines  . History of hiatal hernia   . Hyperlipidemia   . Hypertension   . IBS (irritable bowel syndrome)   . Pneumonia    "years ago"  . PONV (postoperative nausea and vomiting)   . Wears glasses    readers    Past Surgical History:  Procedure Laterality Date  . ABDOMINAL HYSTERECTOMY    . APPENDECTOMY    . BREAST LUMPECTOMY  1985   lt  . CARPAL TUNNEL RELEASE     rt  . CHOLECYSTECTOMY    . ELBOW SURGERY    . EYE SURGERY     both cataracts  . KNEE SURGERY     lt  . NASAL SEPTUM SURGERY    . REVERSE SHOULDER ARTHROPLASTY Right 02/18/2016   Procedure: RIGHT REVERSE TOTAL SHOULDER ARTHROPLASTY;  Surgeon: Loreta Ave, MD;  [facility];  Service: Orthopedics;  Laterality: Right;  . REVERSE TOTAL SHOULDER ARTHROPLASTY Right 02/18/2016  . SHOULDER ACROMIOPLASTY Right 05/09/2014   Procedure: SHOULDER ACROMIOPLASTY;  Surgeon: Loreta Ave, MD;  Location: [facility];  Service: Orthopedics;  Laterality: Right;  . SHOULDER ARTHROSCOPY WITH BICEPSTENOTOMY Right 05/09/2014   Procedure: SHOULDER ARTHROSCOPY WITH  BICEPSTENOTOMY;  Surgeon: Loreta Ave, MD;  Location: [facility];  Service: Orthopedics;  Laterality: Right;  Debridement Rotator Cuff Tear  . SHOULDER ARTHROSCOPY WITH ROTATOR CUFF REPAIR Right 12/19/2014   Procedure: RIGHT SHOULDER ARTHROSCOPY WITH ROTATOR CUFF REPAIR,CALCIFIC DEBRIDEMENT EXTENSIVE;  Surgeon: Loreta Ave, MD;  Location: [facility];  Service: Orthopedics;  Laterality: Right;  . SHOULDER ARTHROSCOPY WITH ROTATOR CUFF REPAIR Right 07/10/2015   Procedure: RIGHT SHOULDER ARTHROSCOPY, DEBRIDEMENT  WITH ROTATOR CUFF REPAIR;  Surgeon: Loreta Ave, MD;  Location: [facility];  Service: Orthopedics;  Laterality: Right;  . SHOULDER SURGERY     left  . TONSILLECTOMY AND ADENOIDECTOMY    . VARICOSE VEIN SURGERY    . WRIST ARTHROSCOPY Right 09/20/2013   Procedure: RIGHT ARTHROSCOPY WRIST EXCISION PISIFORM;  Surgeon: Wyn Forster, MD;  Location: [facility];  Service: Orthopedics;  Laterality: Right;    There were no vitals filed for this visit.      Subjective Assessment - 05/17/16 1115    Subjective Some continued pain - pain radiating into R side of jaw - doesn't know where pain is coming from   Patient Stated Goals to improve shoulder function   Currently in  Pain? Yes   Pain Score 5    Pain Location Shoulder   Pain Orientation Right   Pain Descriptors / Indicators Constant;Tightness;Aching   Pain Type Acute pain   Pain Onset More than a month ago   Pain Frequency Constant   Aggravating Factors  HEP, dressing   Pain Relieving Factors ice, rest                                 PT Education - 05/17/16 1116    Education provided Yes   Education Details AROM in supine   Person(s) Educated Patient   Methods Explanation;Demonstration   Comprehension Verbalized understanding;Returned demonstration          PT Short Term Goals - 05/11/16 1239      PT SHORT TERM GOAL #1   Title  patient to be independent with HEP (04/14/16)   Status Achieved     PT SHORT TERM GOAL #2   Title Patient to improve R shoulder PROM flexion to 120 and ER to 50 (in scapular plane) (04/14/16)   Status Achieved           PT Long Term Goals - 05/17/16 1117      PT LONG TERM GOAL #1   Title patient to be independent with advanced HEP (07/09/16)   Status On-going     PT LONG TERM GOAL #2   Title Patient to improve AROM of R shoulder to 90 deg flexion in sitting (07/09/16)   Status On-going     PT LONG TERM GOAL #3   Title Patient to demonstrate proper postural alignment with ability to self correct (05/12/16)   Status Achieved     PT LONG TERM GOAL #4   Title Patient to demonstrate ability to reach cabinet of shoulder height with minimal compensation (07/09/16)   Status New               Plan - 05/17/16 1120    Clinical Impression Statement [name] [name] doing well - tolerating all passive stretching, manual therapy, and progressions with therapeutic exericse. Progression is slow, however, she is making gains in PROM, with greatest improvements seen after contract-relax stretching. Patient continues to demonstrate heavy compensation with all active assist and active movements of right shoulder likely due to overall weakness. Patient continues to have subjective reports of overall tightness as well as "ice-pick" pain when returning shoulder to neutal positioning. Patient to continue to benefit from PT to maximize functional use of R UE.    PT Treatment/Interventions ADLs/Self Care Home Management;Cryotherapy;Electrical Stimulation;Moist Heat;Ultrasound;Therapeutic exercise;Therapeutic activities;Functional mobility training;Patient/family education;Manual techniques;Passive range of motion;Vasopneumatic Device;Taping;Dry needling   PT Next Visit Plan progress AROM in both supine and sitting; modalities prn for pain   Consulted and Agree with Plan of Care Patient      Patient will benefit  from skilled therapeutic intervention in order to improve the following deficits and impairments:  Decreased activity tolerance, Decreased range of motion, Decreased strength, Impaired UE functional use, Pain  Visit Diagnosis: Right shoulder pain, unspecified chronicity  Stiffness of right shoulder, not elsewhere classified  Abnormal posture  Muscle weakness (generalized)     Problem List Patient Active Problem List   Diagnosis Date Noted  . 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 Preop cardiovascular exam 07/09/2015  . Chest pain 05/09/2013  . Hypertension 02/23/2012  . Hyperlipidemia 02/23/2012  . Right shoulder pain 01/25/2012  .  Left wrist pain 01/16/2012  . Neck pain 12/26/2011     Kipp LaurenceStephanie R Shirlyn Savin, PT, DPT 05/18/16 8:08 AM   [facility] [address]  [address] [place], [place], [address] Phone: [phone]   Fax:  [phone]  Name: [name] [name] MRN: [id number] Date of Birth: [birth year]

## 2016-05-20 ENCOUNTER — Ambulatory Visit: Payer: Medicare Other | Admitting: Physical Therapy

## 2016-05-25 ENCOUNTER — Ambulatory Visit: Payer: Medicare Other | Admitting: Physical Therapy

## 2016-05-25 DIAGNOSIS — R293 Abnormal posture: Secondary | ICD-10-CM

## 2016-05-25 DIAGNOSIS — M25511 Pain in right shoulder: Secondary | ICD-10-CM

## 2016-05-25 DIAGNOSIS — M6281 Muscle weakness (generalized): Secondary | ICD-10-CM

## 2016-05-25 DIAGNOSIS — M25611 Stiffness of right shoulder, not elsewhere classified: Secondary | ICD-10-CM

## 2016-05-25 NOTE — Therapy (Addendum)
[facility] [address]  [address] [place], [place], [address] Phone: [phone]   Fax:  [phone]  Physical Therapy Treatment  Patient Details  Name: [name] [name] MRN: [id number] Date of Birth: [birth year] Referring Provider: Dr. [name]  Encounter Date: 05/25/2016      PT End of Session - 05/25/16 1505    Visit Number 13   Number of Visits 28   Date for PT Re-Evaluation 07/09/16   PT Start Time 1102   PT Stop Time 1201   PT Time Calculation (min) 59 min   Activity Tolerance Patient tolerated treatment well   Behavior During Therapy [facility] for tasks assessed/performed      Past Medical History:  Diagnosis Date  . Allergy   . Anxiety   . Arthritis   . Asthma    Bronchial   . Celiac disease   . Full dentures   . Headache    migraines  . History of hiatal hernia   . Hyperlipidemia   . Hypertension   . IBS (irritable bowel syndrome)   . Pneumonia    "years ago"  . PONV (postoperative nausea and vomiting)   . Wears glasses    readers    Past Surgical History:  Procedure Laterality Date  . ABDOMINAL HYSTERECTOMY    . APPENDECTOMY    . BREAST LUMPECTOMY  1985   lt  . CARPAL TUNNEL RELEASE     rt  . CHOLECYSTECTOMY    . ELBOW SURGERY    . EYE SURGERY     both cataracts  . KNEE SURGERY     lt  . NASAL SEPTUM SURGERY    . REVERSE SHOULDER ARTHROPLASTY Right 02/18/2016   Procedure: RIGHT REVERSE TOTAL SHOULDER ARTHROPLASTY;  Surgeon: [name], MD;  [facility];  Service: Orthopedics;  Laterality: Right;  . REVERSE TOTAL SHOULDER ARTHROPLASTY Right 02/18/2016  . SHOULDER ACROMIOPLASTY Right 05/09/2014   Procedure: SHOULDER ACROMIOPLASTY;  Surgeon: [name], MD;  Location: [facility];  Service: Orthopedics;  Laterality: Right;  . SHOULDER ARTHROSCOPY WITH BICEPSTENOTOMY Right 05/09/2014   Procedure: SHOULDER ARTHROSCOPY WITH BICEPSTENOTOMY;  Surgeon: [name], MD;   Location: [facility];  Service: Orthopedics;  Laterality: Right;  Debridement Rotator Cuff Tear  . SHOULDER ARTHROSCOPY WITH ROTATOR CUFF REPAIR Right 12/19/2014   Procedure: RIGHT SHOULDER ARTHROSCOPY WITH ROTATOR CUFF REPAIR,CALCIFIC DEBRIDEMENT EXTENSIVE;  Surgeon: [name], MD;  Location: [facility];  Service: Orthopedics;  Laterality: Right;  . SHOULDER ARTHROSCOPY WITH ROTATOR CUFF REPAIR Right 07/10/2015   Procedure: RIGHT SHOULDER ARTHROSCOPY, DEBRIDEMENT  WITH ROTATOR CUFF REPAIR;  Surgeon: [name], MD;  Location: [facility];  Service: Orthopedics;  Laterality: Right;  . SHOULDER SURGERY     left  . TONSILLECTOMY AND ADENOIDECTOMY    . VARICOSE VEIN SURGERY    . WRIST ARTHROSCOPY Right 09/20/2013   Procedure: RIGHT ARTHROSCOPY WRIST EXCISION PISIFORM;  Surgeon: [name], MD;  Location: [facility];  Service: Orthopedics;  Laterality: Right;    There were no vitals filed for this visit.      Subjective Assessment - 05/25/16 1443    Subjective Manipulation of R shoulder on 05/24/16 - feeling well with primary complaints of R elbow pain > R shoulder pain   Patient Stated Goals to improve shoulder function   Currently in Pain? Yes   Pain Score 7  Pain Location Shoulder   Pain Orientation Right   Pain Descriptors / Indicators Aching;Tightness;Sore;Constant   Pain Type Acute pain   Pain Onset More than a month ago   Pain Frequency Constant   Aggravating Factors  Hep, dressing   Pain Relieving Factors ice, rest            [facility] PT Assessment - 05/25/16 1447      Assessment   Medical Diagnosis R RTSA; R shoulder manipulation   Referring Provider Dr. Mckinley Jewel   Onset Date/Surgical Date 02/18/16  manipulation on 05/24/16   Hand Dominance Right   Next MD Visit 06/01/16     Precautions   Precaution Comments RTSA protocol     ROM / Strength   AROM / PROM / Strength AROM;PROM     AROM    AROM Assessment Site Shoulder   Right/Left Shoulder Right   Right Shoulder Flexion 88 Degrees  improving to115 (scaption) following stretching - heavy comp   Right Shoulder ABduction 83 Degrees  improving to 102 after stretching - heavy compensation   Right Shoulder Internal Rotation --  functional to iliac crest   Right Shoulder External Rotation --  functional to anterior ear     PROM   Right Shoulder Flexion 144 Degrees   Right Shoulder ABduction 135 Degrees   Right Shoulder Internal Rotation 70 Degrees   Right Shoulder External Rotation 60 Degrees                     [facility] Adult PT Treatment/Exercise - 05/25/16 0001      Shoulder Exercises: Seated   Flexion AAROM;Right;10 reps   Flexion Limitations cane   Abduction AAROM;Right;10 reps   ABduction Limitations cane   Other Seated Exercises seated flexion AROM into available range - AAROM by PT into further range - static hold and eccentric lowering by patient x 10 reps; seated abduction AROM into available range - AAROM by PT into further range - static hold and eccentric lowering by patient x 10 reps;      Modalities   Modalities Electrical Stimulation;Moist Heat;Vasopneumatic     Moist Heat Therapy   Number Minutes Moist Heat 15 Minutes   Moist Heat Location Shoulder  prior to treatment     Electrical Stimulation   Electrical Stimulation Location R UT, R anterior shoulder, R lateral borer of scapula, and R deltoid area   Electrical Stimulation Action IFC   Electrical Stimulation Parameters to tolerance   Electrical Stimulation Goals Pain     Vasopneumatic   Number Minutes Vasopneumatic  15 minutes   Vasopnuematic Location  Shoulder   Vasopneumatic Pressure Medium   Vasopneumatic Temperature  coldest temp     Manual Therapy   Manual Therapy Passive ROM;Soft tissue mobilization   Soft tissue mobilization STM to R deltoid, R anterio shoulder, R UT, R elbow   Passive ROM PROM into flexion, abduction,  IR and ER with overpressure at end range 10 x 10 seconds each direction                  PT Short Term Goals - 05/11/16 1239      PT SHORT TERM GOAL #1   Title patient to be independent with HEP (04/14/16)   Status Achieved     PT SHORT TERM GOAL #2   Title Patient to improve R shoulder PROM flexion to 120 and ER to 50 (in scapular plane) (04/14/16)   Status Achieved  PT Long Term Goals - 2016/05/29 1522      PT LONG TERM GOAL #1   Title patient to be independent with advanced HEP (07/09/16)   Status On-going     PT LONG TERM GOAL #2   Title Patient to improve AROM of R shoulder to 140 deg flexion in sitting (07/09/16)   Status Revised     PT LONG TERM GOAL #3   Title Patient to demonstrate proper postural alignment with ability to self correct (05/12/16)   Status Achieved     PT LONG TERM GOAL #4   Title Patient to demonstrate ability to reach cabinet of shoulder height with minimal compensation (07/09/16)   Status On-going     PT LONG TERM GOAL #5   Title Patient to report improved functional use of R UE (bathing, hair care, dressing) with minimal compensations (07/09/16)   Status New               Plan - 05-29-2016 1524    Clinical Impression Statement [name] [name] s/p manipulation of R shoulder on 05/24/16 due to excessive scar tissue formation following R RTSA limting overall functional use and ROM of R UE. [name] [name] tolerating all stretching today with overpressure at end range to facilitate stretching and overall carryover of improved ROM. AROM measured at beginning of session with both flexion and abduction lower than 90 degrees, however after manual stetching, [name] [name] able to improve AROM to over 100 degress into both planes, however with heavy compensation. Patient to be seen this week in PT daily, with the next two weeks at 3x/week and then weaning down to 2x/week.    PT Frequency 5x / week  daily for week of 05/24/16; 3x/week for week of 2/19 and 2/26; 2x/week  following   PT Duration 6 weeks   PT Treatment/Interventions ADLs/Self Care Home Management;Cryotherapy;Electrical Stimulation;Moist Heat;Ultrasound;Therapeutic exercise;Therapeutic activities;Functional mobility training;Patient/family education;Manual techniques;Passive range of motion;Vasopneumatic Device;Taping;Dry needling   PT Next Visit Plan progress AROM/strength of R shoulder; modalities prn for pain; manual stretching   Consulted and Agree with Plan of Care Patient      Patient will benefit from skilled therapeutic intervention in order to improve the following deficits and impairments:  Decreased activity tolerance, Decreased range of motion, Decreased strength, Impaired UE functional use, Pain  Visit Diagnosis: Right shoulder pain, unspecified chronicity - Plan: PT plan of care cert/re-cert  Stiffness of right shoulder, not elsewhere classified - Plan: PT plan of care cert/re-cert  Abnormal posture - Plan: PT plan of care cert/re-cert  Muscle weakness (generalized) - Plan: PT plan of care cert/re-cert     G-Codes - 05/29/2016   Functional Assessment Tool Used Clinical Judgement   Functional Limitation Carrying, moving and handling objects   Carrying, Moving and Handling Objects Current Status (Z6109) At least 40 percent but less than 60 percent impaired, limited or restricted   Carrying, Moving and Handling Objects Goal Status (U0454) At least 40 percent but less than 60 percent impaired, limited or restricted     Problem List Patient Active Problem List   Diagnosis Date Noted  . 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 Preop cardiovascular exam 07/09/2015  . Chest pain 05/09/2013  . Hypertension 02/23/2012  . Hyperlipidemia 02/23/2012  . Right shoulder pain 01/25/2012  . Left wrist pain 01/16/2012  . Neck pain 12/26/2011     Kipp Laurence, PT, DPT 2016-05-29 6:05 PM   [facility] [address]  [address]  [address] [place], [place], [address] Phone: [phone]   Fax:  [phone]  Name: [name] [name] MRN: [id number] Date of Birth: [birth year]

## 2016-05-26 ENCOUNTER — Ambulatory Visit: Payer: Medicare Other | Admitting: Physical Therapy

## 2016-05-26 DIAGNOSIS — R293 Abnormal posture: Secondary | ICD-10-CM

## 2016-05-26 DIAGNOSIS — M6281 Muscle weakness (generalized): Secondary | ICD-10-CM

## 2016-05-26 DIAGNOSIS — M25511 Pain in right shoulder: Secondary | ICD-10-CM

## 2016-05-26 DIAGNOSIS — M25611 Stiffness of right shoulder, not elsewhere classified: Secondary | ICD-10-CM

## 2016-05-26 NOTE — Therapy (Signed)
[facility] [address]  [address] [place], [place], [address] Phone: [phone]   Fax:  [phone]  Physical Therapy Treatment  Patient Details  Name: [name] [name] MRN: [id number] Date of Birth: [birth year] Referring Provider: Dr. Mckinley Jewel  Encounter Date: 05/26/2016      PT End of Session - 05/26/16 1343    Visit Number 14   Number of Visits 28   Date for PT Re-Evaluation 07/09/16   PT Start Time 1058   PT Stop Time 1202   PT Time Calculation (min) 64 min   Activity Tolerance Patient tolerated treatment well   Behavior During Therapy [facility] for tasks assessed/performed      Past Medical History:  Diagnosis Date  . Allergy   . Anxiety   . Arthritis   . Asthma    Bronchial   . Celiac disease   . Full dentures   . Headache    migraines  . History of hiatal hernia   . Hyperlipidemia   . Hypertension   . IBS (irritable bowel syndrome)   . Pneumonia    "years ago"  . PONV (postoperative nausea and vomiting)   . Wears glasses    readers    Past Surgical History:  Procedure Laterality Date  . ABDOMINAL HYSTERECTOMY    . APPENDECTOMY    . BREAST LUMPECTOMY  1985   lt  . CARPAL TUNNEL RELEASE     rt  . CHOLECYSTECTOMY    . ELBOW SURGERY    . EYE SURGERY     both cataracts  . KNEE SURGERY     lt  . NASAL SEPTUM SURGERY    . REVERSE SHOULDER ARTHROPLASTY Right 02/18/2016   Procedure: RIGHT REVERSE TOTAL SHOULDER ARTHROPLASTY;  Surgeon: Loreta Ave, MD;  Location: [facility] OR;  Service: Orthopedics;  Laterality: Right;  . REVERSE TOTAL SHOULDER ARTHROPLASTY Right 02/18/2016  . SHOULDER ACROMIOPLASTY Right 05/09/2014   Procedure: SHOULDER ACROMIOPLASTY;  Surgeon: Loreta Ave, MD;  Location: [facility];  Service: Orthopedics;  Laterality: Right;  . SHOULDER ARTHROSCOPY WITH BICEPSTENOTOMY Right 05/09/2014   Procedure: SHOULDER ARTHROSCOPY WITH BICEPSTENOTOMY;  Surgeon: Loreta Ave, MD;   Location: [facility];  Service: Orthopedics;  Laterality: Right;  Debridement Rotator Cuff Tear  . SHOULDER ARTHROSCOPY WITH ROTATOR CUFF REPAIR Right 12/19/2014   Procedure: RIGHT SHOULDER ARTHROSCOPY WITH ROTATOR CUFF REPAIR,CALCIFIC DEBRIDEMENT EXTENSIVE;  Surgeon: Loreta Ave, MD;  Location: [facility];  Service: Orthopedics;  Laterality: Right;  . SHOULDER ARTHROSCOPY WITH ROTATOR CUFF REPAIR Right 07/10/2015   Procedure: RIGHT SHOULDER ARTHROSCOPY, DEBRIDEMENT  WITH ROTATOR CUFF REPAIR;  Surgeon: Loreta Ave, MD;  Location: [facility];  Service: Orthopedics;  Laterality: Right;  . SHOULDER SURGERY     left  . TONSILLECTOMY AND ADENOIDECTOMY    . VARICOSE VEIN SURGERY    . WRIST ARTHROSCOPY Right 09/20/2013   Procedure: RIGHT ARTHROSCOPY WRIST EXCISION PISIFORM;  Surgeon: Wyn Forster, MD;  Location: [facility];  Service: Orthopedics;  Laterality: Right;    There were no vitals filed for this visit.      Subjective Assessment - 05/26/16 1335    Subjective elbow pain is most troublesome - pain of 10/10 at elbow   Patient Stated Goals to improve shoulder function   Currently in Pain? Yes   Pain Score 4    Pain Location Shoulder   Pain Orientation  Right   Pain Descriptors / Indicators Tightness;Sore;Aching   Pain Type Acute pain   Pain Onset More than a month ago   Pain Frequency Constant   Aggravating Factors  HEP, dressing   Pain Relieving Factors ice, rest            [facility] PT Assessment - 05/26/16 0001      PROM   Right Shoulder Flexion 160 Degrees  with overpressure   Right Shoulder ABduction 154 Degrees  with overpressure                     [facility] Adult PT Treatment/Exercise - 05/26/16 1340      Shoulder Exercises: Supine   Flexion AROM;Right;10 reps     Shoulder Exercises: Seated   Retraction 10 reps   Other Seated Exercises seated flexion AROM into available range - AAROM  by PT into further range - static hold and eccentric lowering by patient x 10 reps; seated abduction AROM into available range - AAROM by PT into further range - static hold and eccentric lowering by patient x 10 reps;      Shoulder Exercises: Sidelying   External Rotation AROM;Right;10 reps   ABduction AROM;Right;10 reps     Shoulder Exercises: Pulleys   Flexion 3 minutes   ABduction 3 minutes     Modalities   Modalities Electrical Stimulation;Moist Heat;Vasopneumatic     Moist Heat Therapy   Number Minutes Moist Heat 15 Minutes   Moist Heat Location Shoulder  prior to treatment     Electrical Stimulation   Electrical Stimulation Location R UT, R anterior shoulder, R lateral borer of scapula, and R deltoid area   Electrical Stimulation Action IFC   Electrical Stimulation Parameters to tolerance   Electrical Stimulation Goals Pain     Vasopneumatic   Number Minutes Vasopneumatic  15 minutes   Vasopnuematic Location  Shoulder   Vasopneumatic Pressure Medium   Vasopneumatic Temperature  coldest temp     Manual Therapy   Manual Therapy Passive ROM;Soft tissue mobilization   Soft tissue mobilization STM to elbow region and subscapularis with overhead stretching   Passive ROM PROM into flexion, abduction, IR and ER with overpressure at end range 10 x 10 seconds each direction                  PT Short Term Goals - 05/11/16 1239      PT SHORT TERM GOAL #1   Title patient to be independent with HEP (04/14/16)   Status Achieved     PT SHORT TERM GOAL #2   Title Patient to improve R shoulder PROM flexion to 120 and ER to 50 (in scapular plane) (04/14/16)   Status Achieved           PT Long Term Goals - 05/25/16 1522      PT LONG TERM GOAL #1   Title patient to be independent with advanced HEP (07/09/16)   Status On-going     PT LONG TERM GOAL #2   Title Patient to improve AROM of R shoulder to 140 deg flexion in sitting (07/09/16)   Status Revised     PT LONG  TERM GOAL #3   Title Patient to demonstrate proper postural alignment with ability to self correct (05/12/16)   Status Achieved     PT LONG TERM GOAL #4   Title Patient to demonstrate ability to reach cabinet of shoulder height with minimal compensation (07/09/16)   Status On-going  PT LONG TERM GOAL #5   Title Patient to report improved functional use of R UE (bathing, hair care, dressing) with minimal compensations (07/09/16)   Status New               Plan - 05/26/16 1356    Clinical Impression Statement [name] [name] today with primary complaints of R shoulder pain, of which pain meds do not seem to help alleviate. Patient also with visible bruising of R elbow, with PT and OT assessing patient and making recommendations to ice, avoid extreme flexion/extension, as well as notify MD if symptoms worsen. Patient tolerating all passive stretching into overpressure. AROM continues to be limited, however, patient giving full effort for continued range. Patient to continue to benefit from skilled PT to maximize functional use of R UE.    PT Treatment/Interventions ADLs/Self Care Home Management;Cryotherapy;Electrical Stimulation;Moist Heat;Ultrasound;Therapeutic exercise;Therapeutic activities;Functional mobility training;Patient/family education;Manual techniques;Passive range of motion;Vasopneumatic Device;Taping;Dry needling   PT Next Visit Plan progress AROM/strength of R shoulder; modalities prn for pain; manual stretching   Consulted and Agree with Plan of Care Patient      Patient will benefit from skilled therapeutic intervention in order to improve the following deficits and impairments:  Decreased activity tolerance, Decreased range of motion, Decreased strength, Impaired UE functional use, Pain  Visit Diagnosis: Right shoulder pain, unspecified chronicity  Stiffness of right shoulder, not elsewhere classified  Abnormal posture  Muscle weakness (generalized)     Problem  List Patient Active Problem List   Diagnosis Date Noted  . 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 Preop cardiovascular exam 07/09/2015  . Chest pain 05/09/2013  . Hypertension 02/23/2012  . Hyperlipidemia 02/23/2012  . Right shoulder pain 01/25/2012  . Left wrist pain 01/16/2012  . Neck pain 12/26/2011     Kipp Laurence, PT, DPT 05/26/16 2:02 PM   [facility] [address]  [address] [place], [place], [address] Phone: [phone]   Fax:  [phone]  Name: [name] [name] MRN: [id number] Date of Birth: 03-31-1946

## 2016-05-27 ENCOUNTER — Ambulatory Visit: Payer: Medicare Other | Admitting: Physical Therapy

## 2016-05-27 DIAGNOSIS — M25511 Pain in right shoulder: Secondary | ICD-10-CM

## 2016-05-27 DIAGNOSIS — R293 Abnormal posture: Secondary | ICD-10-CM

## 2016-05-27 DIAGNOSIS — M25611 Stiffness of right shoulder, not elsewhere classified: Secondary | ICD-10-CM

## 2016-05-27 DIAGNOSIS — M6281 Muscle weakness (generalized): Secondary | ICD-10-CM

## 2016-05-27 NOTE — Therapy (Signed)
[facility] [address]  [address] [place], [place], [address] Phone: [phone]   Fax:  [phone]  Physical Therapy Treatment  Patient Details  Name: [name] [name] MRN: [id number] Date of Birth: Dec 10, 1945 Referring Provider: Dr. Mckinley Jewel  Encounter Date: 05/27/2016      PT End of Session - 05/27/16 1710    Visit Number 15   Number of Visits 28   Date for PT Re-Evaluation 07/09/16   PT Start Time 1102   PT Stop Time 1210   PT Time Calculation (min) 68 min   Activity Tolerance Patient tolerated treatment well   Behavior During Therapy [facility] for tasks assessed/performed      Past Medical History:  Diagnosis Date  . Allergy   . Anxiety   . Arthritis   . Asthma    Bronchial   . Celiac disease   . Full dentures   . Headache    migraines  . History of hiatal hernia   . Hyperlipidemia   . Hypertension   . IBS (irritable bowel syndrome)   . Pneumonia    "years ago"  . PONV (postoperative nausea and vomiting)   . Wears glasses    readers    Past Surgical History:  Procedure Laterality Date  . ABDOMINAL HYSTERECTOMY    . APPENDECTOMY    . BREAST LUMPECTOMY  1985   lt  . CARPAL TUNNEL RELEASE     rt  . CHOLECYSTECTOMY    . ELBOW SURGERY    . EYE SURGERY     both cataracts  . KNEE SURGERY     lt  . NASAL SEPTUM SURGERY    . REVERSE SHOULDER ARTHROPLASTY Right 02/18/2016   Procedure: RIGHT REVERSE TOTAL SHOULDER ARTHROPLASTY;  Surgeon: Loreta Ave, MD;  Location: [facility] OR;  Service: Orthopedics;  Laterality: Right;  . REVERSE TOTAL SHOULDER ARTHROPLASTY Right 02/18/2016  . SHOULDER ACROMIOPLASTY Right 05/09/2014   Procedure: SHOULDER ACROMIOPLASTY;  Surgeon: Loreta Ave, MD;  Location: [facility];  Service: Orthopedics;  Laterality: Right;  . SHOULDER ARTHROSCOPY WITH BICEPSTENOTOMY Right 05/09/2014   Procedure: SHOULDER ARTHROSCOPY WITH BICEPSTENOTOMY;  Surgeon: Loreta Ave, MD;   Location: [facility];  Service: Orthopedics;  Laterality: Right;  Debridement Rotator Cuff Tear  . SHOULDER ARTHROSCOPY WITH ROTATOR CUFF REPAIR Right 12/19/2014   Procedure: RIGHT SHOULDER ARTHROSCOPY WITH ROTATOR CUFF REPAIR,CALCIFIC DEBRIDEMENT EXTENSIVE;  Surgeon: Loreta Ave, MD;  Location: [facility];  Service: Orthopedics;  Laterality: Right;  . SHOULDER ARTHROSCOPY WITH ROTATOR CUFF REPAIR Right 07/10/2015   Procedure: RIGHT SHOULDER ARTHROSCOPY, DEBRIDEMENT  WITH ROTATOR CUFF REPAIR;  Surgeon: Loreta Ave, MD;  Location: [facility];  Service: Orthopedics;  Laterality: Right;  . SHOULDER SURGERY     left  . TONSILLECTOMY AND ADENOIDECTOMY    . VARICOSE VEIN SURGERY    . WRIST ARTHROSCOPY Right 09/20/2013   Procedure: RIGHT ARTHROSCOPY WRIST EXCISION PISIFORM;  Surgeon: Wyn Forster, MD;  Location: [facility];  Service: Orthopedics;  Laterality: Right;    There were no vitals filed for this visit.      Subjective Assessment - 05/27/16 1703    Subjective continues to have elbow pain, has been doing a lot of stretching at home after therapy sessions   Patient Stated Goals to improve shoulder function   Currently in Pain? Yes   Pain Score 5    Pain  Location Shoulder   Pain Orientation Right   Pain Descriptors / Indicators Aching;Tightness;Constant   Pain Type Acute pain   Pain Onset More than a month ago   Pain Frequency Constant   Aggravating Factors  HEP, dressing   Pain Relieving Factors ice, rest                         [facility] Adult PT Treatment/Exercise - 05/27/16 0001      Shoulder Exercises: Supine   Flexion AROM;Right;15 reps     Shoulder Exercises: Seated   Other Seated Exercises seated flexion AROM into available range - AAROM by PT into further range - static hold and eccentric lowering by patient x 10 reps; seated abduction AROM into available range - AAROM by PT into further  range - static hold and eccentric lowering by patient x 10 reps;      Shoulder Exercises: Sidelying   External Rotation AROM;Right;15 reps   ABduction AROM;Right;15 reps     Shoulder Exercises: Standing   Flexion AAROM;Right;15 reps   Flexion Limitations wall ladder; with hold at top and eccentric lowering   ABduction AAROM;Right;15 reps   ABduction Limitations wall ladder; with hold at top and eccentric lowering     Shoulder Exercises: Pulleys   Flexion 3 minutes   ABduction 3 minutes     Modalities   Modalities Electrical Stimulation;Moist Heat;Vasopneumatic     Moist Heat Therapy   Number Minutes Moist Heat 15 Minutes   Moist Heat Location Shoulder  prior to treatment     Electrical Stimulation   Electrical Stimulation Location R UT, R anterior shoulder, R lateral borer of scapula, and R deltoid area   Electrical Stimulation Action IFC   Electrical Stimulation Parameters to tolerance   Electrical Stimulation Goals Pain     Vasopneumatic   Number Minutes Vasopneumatic  15 minutes   Vasopnuematic Location  Shoulder   Vasopneumatic Pressure Medium   Vasopneumatic Temperature  coldest temp     Manual Therapy   Manual Therapy Passive ROM   Passive ROM PROM into flexion, abduction, IR and ER with overpressure at end range 10 x 10 seconds each direction                  PT Short Term Goals - 05/11/16 1239      PT SHORT TERM GOAL #1   Title patient to be independent with HEP (04/14/16)   Status Achieved     PT SHORT TERM GOAL #2   Title Patient to improve R shoulder PROM flexion to 120 and ER to 50 (in scapular plane) (04/14/16)   Status Achieved           PT Long Term Goals - 05/25/16 1522      PT LONG TERM GOAL #1   Title patient to be independent with advanced HEP (07/09/16)   Status On-going     PT LONG TERM GOAL #2   Title Patient to improve AROM of R shoulder to 140 deg flexion in sitting (07/09/16)   Status Revised     PT LONG TERM GOAL #3    Title Patient to demonstrate proper postural alignment with ability to self correct (05/12/16)   Status Achieved     PT LONG TERM GOAL #4   Title Patient to demonstrate ability to reach cabinet of shoulder height with minimal compensation (07/09/16)   Status On-going     PT LONG TERM GOAL #5  Title Patient to report improved functional use of R UE (bathing, hair care, dressing) with minimal compensations (07/09/16)   Status New               Plan - 05/27/16 1713    Clinical Impression Statement PT session continuing to focus on heavy stretching for progression and maintaince of range of motion with good tolerance to all passive stretching with overpressure. [name] [name] very compliant with all HEP activities and pulleys at home. Patient able to incorporate wall ladders into todays treatment with hold at max range and eccentric lowering with good tolerance.    PT Treatment/Interventions ADLs/Self Care Home Management;Cryotherapy;Electrical Stimulation;Moist Heat;Ultrasound;Therapeutic exercise;Therapeutic activities;Functional mobility training;Patient/family education;Manual techniques;Passive range of motion;Vasopneumatic Device;Taping;Dry needling   PT Next Visit Plan progress AROM/strength of R shoulder; modalities prn for pain; manual stretching   Consulted and Agree with Plan of Care Patient      Patient will benefit from skilled therapeutic intervention in order to improve the following deficits and impairments:  Decreased activity tolerance, Decreased range of motion, Decreased strength, Impaired UE functional use, Pain  Visit Diagnosis: Right shoulder pain, unspecified chronicity  Stiffness of right shoulder, not elsewhere classified  Abnormal posture  Muscle weakness (generalized)     Problem List Patient Active Problem List   Diagnosis Date Noted  . 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 Preop cardiovascular exam 07/09/2015  . Chest pain  05/09/2013  . Hypertension 02/23/2012  . Hyperlipidemia 02/23/2012  . Right shoulder pain 01/25/2012  . Left wrist pain 01/16/2012  . Neck pain 12/26/2011     Kipp Laurence, PT, DPT 05/27/16 5:27 PM   [facility] Rehabilitation [facility] [address]  [address] [place], [place], [address] Phone: [phone]   Fax:  [phone]  Name: [name] [name] MRN: [id number] Date of Birth: January 07, 1946

## 2016-05-28 ENCOUNTER — Ambulatory Visit: Payer: Medicare Other | Admitting: Physical Therapy

## 2016-05-28 DIAGNOSIS — M25611 Stiffness of right shoulder, not elsewhere classified: Secondary | ICD-10-CM

## 2016-05-28 DIAGNOSIS — M6281 Muscle weakness (generalized): Secondary | ICD-10-CM

## 2016-05-28 DIAGNOSIS — R293 Abnormal posture: Secondary | ICD-10-CM

## 2016-05-28 DIAGNOSIS — M25511 Pain in right shoulder: Secondary | ICD-10-CM

## 2016-05-28 NOTE — Therapy (Signed)
[facility] [address]  [address] [place], [place], [address] Phone: [phone]   Fax:  [phone]  Physical Therapy Treatment  Patient Details  Name: [name] [name] MRN: [id number] Date of Birth: 15-Oct-1945 Referring Provider: Dr. Mckinley Jewel  Encounter Date: 05/28/2016      PT End of Session - 05/28/16 1202    Visit Number 16   Number of Visits 28   Date for PT Re-Evaluation 07/09/16   PT Start Time 1100   PT Stop Time 1209   PT Time Calculation (min) 69 min   Activity Tolerance Patient tolerated treatment well   Behavior During Therapy [facility] for tasks assessed/performed      Past Medical History:  Diagnosis Date  . Allergy   . Anxiety   . Arthritis   . Asthma    Bronchial   . Celiac disease   . Full dentures   . Headache    migraines  . History of hiatal hernia   . Hyperlipidemia   . Hypertension   . IBS (irritable bowel syndrome)   . Pneumonia    "years ago"  . PONV (postoperative nausea and vomiting)   . Wears glasses    readers    Past Surgical History:  Procedure Laterality Date  . ABDOMINAL HYSTERECTOMY    . APPENDECTOMY    . BREAST LUMPECTOMY  1985   lt  . CARPAL TUNNEL RELEASE     rt  . CHOLECYSTECTOMY    . ELBOW SURGERY    . EYE SURGERY     both cataracts  . KNEE SURGERY     lt  . NASAL SEPTUM SURGERY    . REVERSE SHOULDER ARTHROPLASTY Right 02/18/2016   Procedure: RIGHT REVERSE TOTAL SHOULDER ARTHROPLASTY;  Surgeon: Loreta Ave, MD;  Location: [facility] OR;  Service: Orthopedics;  Laterality: Right;  . REVERSE TOTAL SHOULDER ARTHROPLASTY Right 02/18/2016  . SHOULDER ACROMIOPLASTY Right 05/09/2014   Procedure: SHOULDER ACROMIOPLASTY;  Surgeon: Loreta Ave, MD;  Location: [facility];  Service: Orthopedics;  Laterality: Right;  . SHOULDER ARTHROSCOPY WITH BICEPSTENOTOMY Right 05/09/2014   Procedure: SHOULDER ARTHROSCOPY WITH BICEPSTENOTOMY;  Surgeon: Loreta Ave, MD;   Location: [facility];  Service: Orthopedics;  Laterality: Right;  Debridement Rotator Cuff Tear  . SHOULDER ARTHROSCOPY WITH ROTATOR CUFF REPAIR Right 12/19/2014   Procedure: RIGHT SHOULDER ARTHROSCOPY WITH ROTATOR CUFF REPAIR,CALCIFIC DEBRIDEMENT EXTENSIVE;  Surgeon: Loreta Ave, MD;  Location: [facility];  Service: Orthopedics;  Laterality: Right;  . SHOULDER ARTHROSCOPY WITH ROTATOR CUFF REPAIR Right 07/10/2015   Procedure: RIGHT SHOULDER ARTHROSCOPY, DEBRIDEMENT  WITH ROTATOR CUFF REPAIR;  Surgeon: Loreta Ave, MD;  Location: [facility];  Service: Orthopedics;  Laterality: Right;  . SHOULDER SURGERY     left  . TONSILLECTOMY AND ADENOIDECTOMY    . VARICOSE VEIN SURGERY    . WRIST ARTHROSCOPY Right 09/20/2013   Procedure: RIGHT ARTHROSCOPY WRIST EXCISION PISIFORM;  Surgeon: Wyn Forster, MD;  Location:  SURGERY CENTER;  Service: Orthopedics;  Laterality: Right;    There were no vitals filed for this visit.      Subjective Assessment - 05/28/16 1153    Subjective large bruise on posterior elbow - giving her the most trouble   Patient Stated Goals to improve shoulder function   Currently in Pain? Yes   Pain Score 4    Pain Location Shoulder   Pain Orientation  Right   Pain Descriptors / Indicators Aching;Tightness;Constant   Pain Type Acute pain   Pain Onset More than a month ago   Pain Frequency Constant            [facility] PT Assessment - 05/28/16 0001      AROM   Right Shoulder Flexion 122 Degrees  in scaption (compensation)   Right Shoulder ABduction 111 Degrees  with lateral side-bending (compensation)                     [facility] Adult PT Treatment/Exercise - 05/28/16 0001      Shoulder Exercises: Supine   Flexion AROM;Right;15 reps;Weights   Shoulder Flexion Weight (lbs) 1     Shoulder Exercises: Seated   Flexion AROM;Right;10 reps   Abduction AROM;Right;10 reps     Shoulder Exercises:  Sidelying   External Rotation AROM;Right;15 reps   ABduction AROM;Right;15 reps;Weights   ABduction Weight (lbs) 1     Shoulder Exercises: Standing   Internal Rotation AAROM;Right;10 reps   Internal Rotation Limitations with towel for stretch     Shoulder Exercises: Pulleys   Flexion 3 minutes   ABduction 3 minutes     Modalities   Modalities Electrical Stimulation;Moist Heat;Vasopneumatic     Moist Heat Therapy   Number Minutes Moist Heat 15 Minutes   Moist Heat Location Shoulder  prior to treatment     Electrical Stimulation   Electrical Stimulation Location R UT, R anterior shoulder, R lateral borer of scapula, and R deltoid area   Electrical Stimulation Action IFC   Electrical Stimulation Parameters to tolerance    Electrical Stimulation Goals Pain     Vasopneumatic   Number Minutes Vasopneumatic  15 minutes   Vasopnuematic Location  Shoulder   Vasopneumatic Pressure Medium   Vasopneumatic Temperature  coldest temp     Manual Therapy   Manual Therapy Passive ROM   Passive ROM PROM into flexion, abduction, IR and ER with overpressure at end range 10 x 10 seconds each direction                  PT Short Term Goals - 05/11/16 1239      PT SHORT TERM GOAL #1   Title patient to be independent with HEP (04/14/16)   Status Achieved     PT SHORT TERM GOAL #2   Title Patient to improve R shoulder PROM flexion to 120 and ER to 50 (in scapular plane) (04/14/16)   Status Achieved           PT Long Term Goals - 05/25/16 1522      PT LONG TERM GOAL #1   Title patient to be independent with advanced HEP (07/09/16)   Status On-going     PT LONG TERM GOAL #2   Title Patient to improve AROM of R shoulder to 140 deg flexion in sitting (07/09/16)   Status Revised     PT LONG TERM GOAL #3   Title Patient to demonstrate proper postural alignment with ability to self correct (05/12/16)   Status Achieved     PT LONG TERM GOAL #4   Title Patient to demonstrate  ability to reach cabinet of shoulder height with minimal compensation (07/09/16)   Status On-going     PT LONG TERM GOAL #5   Title Patient to report improved functional use of R UE (bathing, hair care, dressing) with minimal compensations (07/09/16)   Status New  Plan - 05/28/16 1202    Clinical Impression Statement [name] [name] doing well today - primary complaints of R elbow pain > R shoulder pain, with purple/blue bruising on psterior aspect of R elbow approximately 1" x 3". Patient improving AROM of R shoulder (as seen above), however with continued compensation into scaption plane and with trunk leaning. Patient to continue to benefit form PT to maximize function.    PT Treatment/Interventions ADLs/Self Care Home Management;Cryotherapy;Electrical Stimulation;Moist Heat;Ultrasound;Therapeutic exercise;Therapeutic activities;Functional mobility training;Patient/family education;Manual techniques;Passive range of motion;Vasopneumatic Device;Taping;Dry needling   PT Next Visit Plan progress AROM/strength of R shoulder; modalities prn for pain; manual stretching   Consulted and Agree with Plan of Care Patient      Patient will benefit from skilled therapeutic intervention in order to improve the following deficits and impairments:  Decreased activity tolerance, Decreased range of motion, Decreased strength, Impaired UE functional use, Pain  Visit Diagnosis: Right shoulder pain, unspecified chronicity  Stiffness of right shoulder, not elsewhere classified  Abnormal posture  Muscle weakness (generalized)     Problem List Patient Active Problem List   Diagnosis Date Noted  . 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 Preop cardiovascular exam 07/09/2015  . Chest pain 05/09/2013  . Hypertension 02/23/2012  . Hyperlipidemia 02/23/2012  . Right shoulder pain 01/25/2012  . Left wrist pain 01/16/2012  . Neck pain 12/26/2011     Kipp LaurenceStephanie R  Nevae Pinnix, PT, DPT 05/28/16 12:11 PM   [facility] [address]  [address] [place], [place], [address] Phone: [phone]   Fax:  [phone]  Name: [name] [name] MRN: [id number] Date of Birth: 1946-01-14

## 2016-05-31 ENCOUNTER — Ambulatory Visit: Payer: Medicare Other | Admitting: Physical Therapy

## 2016-05-31 DIAGNOSIS — M25511 Pain in right shoulder: Secondary | ICD-10-CM | POA: Diagnosis not present

## 2016-05-31 DIAGNOSIS — M25611 Stiffness of right shoulder, not elsewhere classified: Secondary | ICD-10-CM

## 2016-05-31 DIAGNOSIS — M6281 Muscle weakness (generalized): Secondary | ICD-10-CM

## 2016-05-31 DIAGNOSIS — R293 Abnormal posture: Secondary | ICD-10-CM

## 2016-05-31 NOTE — Therapy (Signed)
[facility] Outpatient Rehabilitation [facility] [address]  [address] [place], [place], [address] Phone: [phone]   Fax:  [phone]  Physical Therapy Treatment  Patient Details  Name: [name] [name] MRN: [id number] Date of Birth: [birth year] Referring Provider: Dr. Mckinley Jewel  Encounter Date: 05/31/2016      PT End of Session - 05/31/16 1157    Visit Number 17   Number of Visits 28   Date for PT Re-Evaluation 07/09/16   PT Start Time 1102   PT Stop Time 1208   PT Time Calculation (min) 66 min   Activity Tolerance Patient tolerated treatment well   Behavior During Therapy [facility] for tasks assessed/performed      Past Medical History:  Diagnosis Date  . Allergy   . Anxiety   . Arthritis   . Asthma    Bronchial   . Celiac disease   . Full dentures   . Headache    migraines  . History of hiatal hernia   . Hyperlipidemia   . Hypertension   . IBS (irritable bowel syndrome)   . Pneumonia    "years ago"  . PONV (postoperative nausea and vomiting)   . Wears glasses    readers    Past Surgical History:  Procedure Laterality Date  . ABDOMINAL HYSTERECTOMY    . APPENDECTOMY    . BREAST LUMPECTOMY  1985   lt  . CARPAL TUNNEL RELEASE     rt  . CHOLECYSTECTOMY    . ELBOW SURGERY    . EYE SURGERY     both cataracts  . KNEE SURGERY     lt  . NASAL SEPTUM SURGERY    . REVERSE SHOULDER ARTHROPLASTY Right 02/18/2016   Procedure: RIGHT REVERSE TOTAL SHOULDER ARTHROPLASTY;  Surgeon: Loreta Ave, MD;  Location: [facility] OR;  Service: Orthopedics;  Laterality: Right;  . REVERSE TOTAL SHOULDER ARTHROPLASTY Right 02/18/2016  . SHOULDER ACROMIOPLASTY Right 05/09/2014   Procedure: SHOULDER ACROMIOPLASTY;  Surgeon: Loreta Ave, MD;  Location: [facility];  Service: Orthopedics;  Laterality: Right;  . SHOULDER ARTHROSCOPY WITH BICEPSTENOTOMY Right 05/09/2014   Procedure: SHOULDER ARTHROSCOPY WITH BICEPSTENOTOMY;  Surgeon: Loreta Ave, MD;   Location: [facility];  Service: Orthopedics;  Laterality: Right;  Debridement Rotator Cuff Tear  . SHOULDER ARTHROSCOPY WITH ROTATOR CUFF REPAIR Right 12/19/2014   Procedure: RIGHT SHOULDER ARTHROSCOPY WITH ROTATOR CUFF REPAIR,CALCIFIC DEBRIDEMENT EXTENSIVE;  Surgeon: Loreta Ave, MD;  Location: [facility];  Service: Orthopedics;  Laterality: Right;  . SHOULDER ARTHROSCOPY WITH ROTATOR CUFF REPAIR Right 07/10/2015   Procedure: RIGHT SHOULDER ARTHROSCOPY, DEBRIDEMENT  WITH ROTATOR CUFF REPAIR;  Surgeon: Loreta Ave, MD;  Location: [facility];  Service: Orthopedics;  Laterality: Right;  . SHOULDER SURGERY     left  . TONSILLECTOMY AND ADENOIDECTOMY    . VARICOSE VEIN SURGERY    . WRIST ARTHROSCOPY Right 09/20/2013   Procedure: RIGHT ARTHROSCOPY WRIST EXCISION PISIFORM;  Surgeon: Wyn Forster, MD;  Location: [facility];  Service: Orthopedics;  Laterality: Right;    There were no vitals filed for this visit.      Subjective Assessment - 05/31/16 1156    Subjective continued elbow pain - bruise remains; will talk to MD tomorrow regarding this   Limitations Lifting;House hold activities   Patient Stated Goals to improve shoulder function   Currently in Pain? Yes   Pain Score 5  Pain Location Shoulder   Pain Orientation Right   Pain Descriptors / Indicators Aching;Tightness   Pain Type Acute pain   Pain Onset More than a month ago   Pain Frequency Constant            [facility] PT Assessment - 05/31/16 1158      Assessment   Medical Diagnosis R RTSA; R shoulder manipulation     AROM   Right Shoulder Flexion 124 Degrees  scaption plane - compensation req. VC   Right Shoulder ABduction 112 Degrees  lateral trunk lean - compensation     PROM   Right Shoulder Flexion 160 Degrees   Right Shoulder ABduction 148 Degrees   Right Shoulder Internal Rotation 70 Degrees   Right Shoulder External Rotation 60 Degrees                      [facility] Adult PT Treatment/Exercise - 05/31/16 0001      Shoulder Exercises: Supine   Flexion AROM;Right;15 reps;Weights   Shoulder Flexion Weight (lbs) 2     Shoulder Exercises: Seated   Flexion AROM;Right;10 reps   Abduction AROM;Right;10 reps     Shoulder Exercises: Sidelying   ABduction AROM;Right;15 reps;Weights   ABduction Weight (lbs) 2     Shoulder Exercises: Standing   Flexion AAROM;Right;15 reps   Flexion Limitations wall ladder; with hold at top and eccentric lowering   ABduction AAROM;Right;15 reps   ABduction Limitations wall ladder; with hold at top and eccentric lowering     Shoulder Exercises: Pulleys   Flexion 3 minutes   ABduction 3 minutes     Modalities   Modalities Electrical Stimulation;Moist Heat;Vasopneumatic     Moist Heat Therapy   Number Minutes Moist Heat 15 Minutes   Moist Heat Location Shoulder  prior to session     Electrical Stimulation   Electrical Stimulation Location R UT, R anterior shoulder, R lateral borer of scapula, and R deltoid area   Electrical Stimulation Action IFC   Electrical Stimulation Parameters to tolerance   Electrical Stimulation Goals Pain     Vasopneumatic   Number Minutes Vasopneumatic  15 minutes   Vasopnuematic Location  Shoulder   Vasopneumatic Pressure Medium   Vasopneumatic Temperature  coldest temp     Manual Therapy   Manual Therapy Passive ROM   Passive ROM PROM into flexion, abduction, IR and ER with overpressure at end range 10 x 10 seconds each direction                  PT Short Term Goals - 05/11/16 1239      PT SHORT TERM GOAL #1   Title patient to be independent with HEP (04/14/16)   Status Achieved     PT SHORT TERM GOAL #2   Title Patient to improve R shoulder PROM flexion to 120 and ER to 50 (in scapular plane) (04/14/16)   Status Achieved           PT Long Term Goals - 05/25/16 1522      PT LONG TERM GOAL #1   Title patient to be  independent with advanced HEP (07/09/16)   Status On-going     PT LONG TERM GOAL #2   Title Patient to improve AROM of R shoulder to 140 deg flexion in sitting (07/09/16)   Status Revised     PT LONG TERM GOAL #3   Title Patient to demonstrate proper postural alignment with ability to self correct (05/12/16)  Status Achieved     PT LONG TERM GOAL #4   Title Patient to demonstrate ability to reach cabinet of shoulder height with minimal compensation (07/09/16)   Status On-going     PT LONG TERM GOAL #5   Title Patient to report improved functional use of R UE (bathing, hair care, dressing) with minimal compensations (07/09/16)   Status New               Plan - 05/31/16 1157    Clinical Impression Statement [place]a doing well today - continues to have complaints of R elbow pain with visible brusing, however, has improved since last visit. Patient with good progress of PROM of R shoulder into flexion and abduction (see above) with pain at end ranges. Continued limitations with AROM with patient demonstrating heavy compensations with tendency to be in the scapular plane or with lateral trunk lean - some improvements in compensatory patterns with verbal cueing. Patient with excellent compliance with HEP for stretching and ROM with good carryover form visit to visit. Patient to continue to benefit form PT to maximize function and mobility.    PT Treatment/Interventions ADLs/Self Care Home Management;Cryotherapy;Electrical Stimulation;Moist Heat;Ultrasound;Therapeutic exercise;Therapeutic activities;Functional mobility training;Patient/family education;Manual techniques;Passive range of motion;Vasopneumatic Device;Taping;Dry needling   PT Next Visit Plan progress AROM/strength of R shoulder; modalities prn for pain; manual stretching   Consulted and Agree with Plan of Care Patient      Patient will benefit from skilled therapeutic intervention in order to improve the following deficits and  impairments:  Decreased activity tolerance, Decreased range of motion, Decreased strength, Impaired UE functional use, Pain  Visit Diagnosis: Right shoulder pain, unspecified chronicity  Stiffness of right shoulder, not elsewhere classified  Abnormal posture  Muscle weakness (generalized)     Problem List Patient Active Problem List   Diagnosis Date Noted  . 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 Preop cardiovascular exam 07/09/2015  . Chest pain 05/09/2013  . Hypertension 02/23/2012  . Hyperlipidemia 02/23/2012  . Right shoulder pain 01/25/2012  . Left wrist pain 01/16/2012  . Neck pain 12/26/2011    Kipp Laurence, PT, DPT 05/31/16 1:05 PM   [facility] [address]  [address] [place], [place], [address] Phone: [phone]   Fax:  [phone]  Name: [name] [name] MRN: [id number] Date of Birth: 10-28-1945

## 2016-06-01 ENCOUNTER — Ambulatory Visit: Payer: Medicare Other | Admitting: Physical Therapy

## 2016-06-01 DIAGNOSIS — M6281 Muscle weakness (generalized): Secondary | ICD-10-CM

## 2016-06-01 DIAGNOSIS — M25611 Stiffness of right shoulder, not elsewhere classified: Secondary | ICD-10-CM

## 2016-06-01 DIAGNOSIS — R293 Abnormal posture: Secondary | ICD-10-CM

## 2016-06-01 DIAGNOSIS — M25511 Pain in right shoulder: Secondary | ICD-10-CM

## 2016-06-01 NOTE — Therapy (Signed)
[facility] Outpatient Rehabilitation [facility] [address]  [address] [place], [place], [address] Phone: [phone]   Fax:  [phone]  Physical Therapy Treatment  Patient Details  Name: [name] [name] MRN: [id number] Date of Birth: 1945-07-04 Referring Provider: Dr. Mckinley Jewel  Encounter Date: 06/01/2016      PT End of Session - 06/01/16 1721    Visit Number 18   Number of Visits 28   Date for PT Re-Evaluation 07/09/16   PT Start Time 1105   PT Stop Time 1202   PT Time Calculation (min) 57 min   Activity Tolerance Patient tolerated treatment well   Behavior During Therapy [facility] for tasks assessed/performed      Past Medical History:  Diagnosis Date  . Allergy   . Anxiety   . Arthritis   . Asthma    Bronchial   . Celiac disease   . Full dentures   . Headache    migraines  . History of hiatal hernia   . Hyperlipidemia   . Hypertension   . IBS (irritable bowel syndrome)   . Pneumonia    "years ago"  . PONV (postoperative nausea and vomiting)   . Wears glasses    readers    Past Surgical History:  Procedure Laterality Date  . ABDOMINAL HYSTERECTOMY    . APPENDECTOMY    . BREAST LUMPECTOMY  1985   lt  . CARPAL TUNNEL RELEASE     rt  . CHOLECYSTECTOMY    . ELBOW SURGERY    . EYE SURGERY     both cataracts  . KNEE SURGERY     lt  . NASAL SEPTUM SURGERY    . REVERSE SHOULDER ARTHROPLASTY Right 02/18/2016   Procedure: RIGHT REVERSE TOTAL SHOULDER ARTHROPLASTY;  Surgeon: Loreta Ave, MD;  Location: [facility] OR;  Service: Orthopedics;  Laterality: Right;  . REVERSE TOTAL SHOULDER ARTHROPLASTY Right 02/18/2016  . SHOULDER ACROMIOPLASTY Right 05/09/2014   Procedure: SHOULDER ACROMIOPLASTY;  Surgeon: Loreta Ave, MD;  Location: [facility];  Service: Orthopedics;  Laterality: Right;  . SHOULDER ARTHROSCOPY WITH BICEPSTENOTOMY Right 05/09/2014   Procedure: SHOULDER ARTHROSCOPY WITH BICEPSTENOTOMY;  Surgeon: Loreta Ave, MD;   Location: [facility];  Service: Orthopedics;  Laterality: Right;  Debridement Rotator Cuff Tear  . SHOULDER ARTHROSCOPY WITH ROTATOR CUFF REPAIR Right 12/19/2014   Procedure: RIGHT SHOULDER ARTHROSCOPY WITH ROTATOR CUFF REPAIR,CALCIFIC DEBRIDEMENT EXTENSIVE;  Surgeon: Loreta Ave, MD;  Location: [facility];  Service: Orthopedics;  Laterality: Right;  . SHOULDER ARTHROSCOPY WITH ROTATOR CUFF REPAIR Right 07/10/2015   Procedure: RIGHT SHOULDER ARTHROSCOPY, DEBRIDEMENT  WITH ROTATOR CUFF REPAIR;  Surgeon: Loreta Ave, MD;  Location: [facility];  Service: Orthopedics;  Laterality: Right;  . SHOULDER SURGERY     left  . TONSILLECTOMY AND ADENOIDECTOMY    . VARICOSE VEIN SURGERY    . WRIST ARTHROSCOPY Right 09/20/2013   Procedure: RIGHT ARTHROSCOPY WRIST EXCISION PISIFORM;  Surgeon: Wyn Forster, MD;  Location:  SURGERY CENTER;  Service: Orthopedics;  Laterality: Right;    There were no vitals filed for this visit.      Subjective Assessment - 06/01/16 1611    Subjective Saw MD today - Md stating to continue PT to work on range and strength, continues to have elbow and shoulder pain   Patient Stated Goals to improve shoulder function   Currently in Pain? Yes   Pain Score  5    Pain Location Shoulder   Pain Orientation Right   Pain Descriptors / Indicators Aching;Tightness   Pain Type Acute pain   Pain Onset More than a month ago   Pain Frequency Constant   Aggravating Factors  HEP, dressing   Pain Relieving Factors ice, rest            [facility] PT Assessment - 05/31/16 1158      Assessment   Medical Diagnosis R RTSA; R shoulder manipulation     AROM   Right Shoulder Flexion 124 Degrees  scaption plane - compensation req. VC   Right Shoulder ABduction 112 Degrees  lateral trunk lean - compensation     PROM   Right Shoulder Flexion 160 Degrees   Right Shoulder ABduction 148 Degrees   Right Shoulder Internal  Rotation 70 Degrees   Right Shoulder External Rotation 60 Degrees                     [facility] Adult PT Treatment/Exercise - 06/01/16 0001      Shoulder Exercises: Supine   Flexion AROM;Right;15 reps;Weights   Shoulder Flexion Weight (lbs) 2     Shoulder Exercises: Seated   Flexion AROM;Right;10 reps   Flexion Weight (lbs) 1   Abduction AROM;Right;10 reps   ABduction Weight (lbs) 1     Shoulder Exercises: Sidelying   External Rotation AROM;Right;15 reps   External Rotation Weight (lbs) 2   External Rotation Limitations with scap squeeze   ABduction AROM;Right;15 reps;Weights   ABduction Weight (lbs) 2     Shoulder Exercises: Standing   Flexion AAROM;Right;15 reps   Flexion Limitations wall ladder; with hold at top and eccentric lowering   ABduction AAROM;Right;15 reps   ABduction Limitations wall ladder; with hold at top and eccentric lowering     Shoulder Exercises: Pulleys   Flexion 3 minutes   ABduction 3 minutes     Modalities   Modalities Electrical Stimulation;Moist Heat;Vasopneumatic     Moist Heat Therapy   Number Minutes Moist Heat 15 Minutes   Moist Heat Location Shoulder     Electrical Stimulation   Electrical Stimulation Location R UT, R anterior shoulder, R lateral borer of scapula, and R deltoid area   Electrical Stimulation Action IFC   Electrical Stimulation Parameters to tolerance   Electrical Stimulation Goals Pain     Vasopneumatic   Number Minutes Vasopneumatic  15 minutes   Vasopnuematic Location  Shoulder   Vasopneumatic Pressure Medium   Vasopneumatic Temperature  coldest temp     Manual Therapy   Manual Therapy Passive ROM   Passive ROM PROM into flexion, abduction, IR and ER with overpressure at end range 10 x 10 seconds each direction                  PT Short Term Goals - 05/11/16 1239      PT SHORT TERM GOAL #1   Title patient to be independent with HEP (04/14/16)   Status Achieved     PT SHORT TERM GOAL #2    Title Patient to improve R shoulder PROM flexion to 120 and ER to 50 (in scapular plane) (04/14/16)   Status Achieved           PT Long Term Goals - 05/25/16 1522      PT LONG TERM GOAL #1   Title patient to be independent with advanced HEP (07/09/16)   Status On-going     PT LONG TERM  GOAL #2   Title Patient to improve AROM of R shoulder to 140 deg flexion in sitting (07/09/16)   Status Revised     PT LONG TERM GOAL #3   Title Patient to demonstrate proper postural alignment with ability to self correct (05/12/16)   Status Achieved     PT LONG TERM GOAL #4   Title Patient to demonstrate ability to reach cabinet of shoulder height with minimal compensation (07/09/16)   Status On-going     PT LONG TERM GOAL #5   Title Patient to report improved functional use of R UE (bathing, hair care, dressing) with minimal compensations (07/09/16)   Status New               Plan - 06/01/16 1722    Clinical Impression Statement Larita Fife seeing MD today for follow-up - continue PT to improve ROM and strength. Patient today with continued tightness and overall pain at shoulder and elbow. AROM in sitting with 1# weight today with tightness/weakness main limiting factor.    PT Treatment/Interventions ADLs/Self Care Home Management;Cryotherapy;Electrical Stimulation;Moist Heat;Ultrasound;Therapeutic exercise;Therapeutic activities;Functional mobility training;Patient/family education;Manual techniques;Passive range of motion;Vasopneumatic Device;Taping;Dry needling   PT Next Visit Plan progress AROM/strength of R shoulder; modalities prn for pain; manual stretching   Consulted and Agree with Plan of Care Patient      Patient will benefit from skilled therapeutic intervention in order to improve the following deficits and impairments:  Decreased activity tolerance, Decreased range of motion, Decreased strength, Impaired UE functional use, Pain  Visit Diagnosis: Right shoulder pain, unspecified  chronicity  Stiffness of right shoulder, not elsewhere classified  Abnormal posture  Muscle weakness (generalized)     Problem List Patient Active Problem List   Diagnosis Date Noted  . 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 Preop cardiovascular exam 07/09/2015  . Chest pain 05/09/2013  . Hypertension 02/23/2012  . Hyperlipidemia 02/23/2012  . Right shoulder pain 01/25/2012  . Left wrist pain 01/16/2012  . Neck pain 12/26/2011    Kipp Laurence, PT, DPT 06/01/16 5:25 PM   [facility] [address]  [address] [place], [place], [address] Phone: [phone]   Fax:  [phone]  Name: LAKETIA VICKNAIR MRN: [id number] Date of Birth: [birth year]

## 2016-06-03 ENCOUNTER — Ambulatory Visit: Payer: Medicare Other | Admitting: Physical Therapy

## 2016-06-03 DIAGNOSIS — R293 Abnormal posture: Secondary | ICD-10-CM

## 2016-06-03 DIAGNOSIS — M25511 Pain in right shoulder: Secondary | ICD-10-CM

## 2016-06-03 DIAGNOSIS — M6281 Muscle weakness (generalized): Secondary | ICD-10-CM

## 2016-06-03 DIAGNOSIS — M25611 Stiffness of right shoulder, not elsewhere classified: Secondary | ICD-10-CM

## 2016-06-03 NOTE — Therapy (Signed)
[facility] [place] [address]  [address] [place], [place], [address] Phone: [phone]   Fax:  [phone]  Physical Therapy Treatment  Patient Details  Name: [name] [name] MRN: [id number] Date of Birth: 26-May-1945 Referring Provider: Dr. [name]  Encounter Date: 06/03/2016      PT End of Session - 06/03/16 1512    Visit Number 19   Number of Visits 28   Date for PT Re-Evaluation 07/09/16   PT Start Time 1100   PT Stop Time 1201   PT Time Calculation (min) 61 min   Activity Tolerance Patient tolerated treatment well   Behavior During Therapy [facility] for tasks assessed/performed      Past Medical History:  Diagnosis Date  . Allergy   . Anxiety   . Arthritis   . Asthma    Bronchial   . Celiac disease   . Full dentures   . Headache    migraines  . History of hiatal hernia   . Hyperlipidemia   . Hypertension   . IBS (irritable bowel syndrome)   . Pneumonia    "years ago"  . PONV (postoperative nausea and vomiting)   . Wears glasses    readers    Past Surgical History:  Procedure Laterality Date  . ABDOMINAL HYSTERECTOMY    . APPENDECTOMY    . BREAST LUMPECTOMY  1985   lt  . CARPAL TUNNEL RELEASE     rt  . CHOLECYSTECTOMY    . ELBOW SURGERY    . EYE SURGERY     both cataracts  . KNEE SURGERY     lt  . NASAL SEPTUM SURGERY    . REVERSE SHOULDER ARTHROPLASTY Right 02/18/2016   Procedure: RIGHT REVERSE TOTAL SHOULDER ARTHROPLASTY;  Surgeon: [name], MD;  [facility];  Service: Orthopedics;  Laterality: Right;  . REVERSE TOTAL SHOULDER ARTHROPLASTY Right 02/18/2016  . SHOULDER ACROMIOPLASTY Right 05/09/2014   Procedure: SHOULDER ACROMIOPLASTY;  Surgeon: [name], MD;  Location: [facility];  Service: Orthopedics;  Laterality: Right;  . SHOULDER ARTHROSCOPY WITH BICEPSTENOTOMY Right 05/09/2014   Procedure: SHOULDER ARTHROSCOPY WITH BICEPSTENOTOMY;  Surgeon: [name], MD;   Location: [facility];  Service: Orthopedics;  Laterality: Right;  Debridement Rotator Cuff Tear  . SHOULDER ARTHROSCOPY WITH ROTATOR CUFF REPAIR Right 12/19/2014   Procedure: RIGHT SHOULDER ARTHROSCOPY WITH ROTATOR CUFF REPAIR,CALCIFIC DEBRIDEMENT EXTENSIVE;  Surgeon: [name], MD;  Location: [facility];  Service: Orthopedics;  Laterality: Right;  . SHOULDER ARTHROSCOPY WITH ROTATOR CUFF REPAIR Right 07/10/2015   Procedure: RIGHT SHOULDER ARTHROSCOPY, DEBRIDEMENT  WITH ROTATOR CUFF REPAIR;  Surgeon: [name], MD;  Location: [facility];  Service: Orthopedics;  Laterality: Right;  . SHOULDER SURGERY     left  . TONSILLECTOMY AND ADENOIDECTOMY    . VARICOSE VEIN SURGERY    . WRIST ARTHROSCOPY Right 09/20/2013   Procedure: RIGHT ARTHROSCOPY WRIST EXCISION PISIFORM;  Surgeon: [name], MD;  Location: [facility];  Service: Orthopedics;  Laterality: Right;    There were no vitals filed for this visit.      Subjective Assessment - 06/03/16 1455    Subjective feels like elbow limits shoulder function - wants to try taping to elbow for pain   Patient Stated Goals to improve shoulder function   Currently in Pain? Yes   Pain Score 5    Pain Location Shoulder  Pain Orientation Right   Pain Descriptors / Indicators Aching;Tightness;Sore   Pain Type Acute pain   Pain Onset More than a month ago                         [facility] Adult PT Treatment/Exercise - 06/03/16 0001      Shoulder Exercises: Supine   Flexion AROM;Right;15 reps;Weights   Shoulder Flexion Weight (lbs) 3     Shoulder Exercises: Seated   Retraction 15 reps  5 second hold   Other Seated Exercises seated flexion AROM into available range - AAROM by PT into further range - static hold and eccentric lowering by patient x 10 reps; seated abduction AROM into available range - AAROM by PT into further range - static hold and eccentric  lowering by patient x 10 reps;      Shoulder Exercises: Prone   Retraction Strengthening;Right;15 reps;Weights   Retraction Weight (lbs) 1   Retraction Limitations prone row with scap squeeze   Extension Strengthening;Right;15 reps;Weights  with scap squeeze   Extension Weight (lbs) 1     Shoulder Exercises: Sidelying   ABduction AROM;Right;15 reps;Weights   ABduction Weight (lbs) 3     Shoulder Exercises: Standing   Flexion AAROM;Right;15 reps   Flexion Limitations wall wash - with 5 second hold   Row Strengthening;Both;15 reps;Theraband   Theraband Level (Shoulder Row) Level 2 (Red)   Row Limitations with scap squeeze     Shoulder Exercises: Pulleys   Flexion 3 minutes   ABduction 3 minutes     Modalities   Modalities Electrical Stimulation;Moist Heat;Vasopneumatic     Moist Heat Therapy   Number Minutes Moist Heat 15 Minutes   Moist Heat Location Shoulder  prior to treatment     Electrical Stimulation   Electrical Stimulation Location R UT, R anterior shoulder, R lateral borer of scapula, and R deltoid area   Electrical Stimulation Action IFC   Electrical Stimulation Parameters to tolerance   Electrical Stimulation Goals Pain     Vasopneumatic   Number Minutes Vasopneumatic  15 minutes   Vasopnuematic Location  Shoulder   Vasopneumatic Pressure Medium   Vasopneumatic Temperature  coldest temp     Manual Therapy   Manual Therapy Taping   Kinesiotex Create Space     Kinesiotix   Create Space medial epicondylitis pattern - 2 I strips                  PT Short Term Goals - 05/11/16 1239      PT SHORT TERM GOAL #1   Title patient to be independent with HEP (04/14/16)   Status Achieved     PT SHORT TERM GOAL #2   Title Patient to improve R shoulder PROM flexion to 120 and ER to 50 (in scapular plane) (04/14/16)   Status Achieved           PT Long Term Goals - 05/25/16 1522      PT LONG TERM GOAL #1   Title patient to be independent with  advanced HEP (07/09/16)   Status On-going     PT LONG TERM GOAL #2   Title Patient to improve AROM of R shoulder to 140 deg flexion in sitting (07/09/16)   Status Revised     PT LONG TERM GOAL #3   Title Patient to demonstrate proper postural alignment with ability to self correct (05/12/16)   Status Achieved     PT LONG TERM  GOAL #4   Title Patient to demonstrate ability to reach cabinet of shoulder height with minimal compensation (07/09/16)   Status On-going     PT LONG TERM GOAL #5   Title Patient to report improved functional use of R UE (bathing, hair care, dressing) with minimal compensations (07/09/16)   Status New               Plan - 06/03/16 1513    Clinical Impression Statement patient today with subjective reports of feeling like elbow pain is limiting shoulder mobility as she has increased elbow pain with overhead movements. Refrained from PROM today as PT session focus more on scapular strengthening and PROM in both supine and seated positions. Application of kinesiotape to R medial elbow for hopeful pain reduction.   PT Treatment/Interventions ADLs/Self Care Home Management;Cryotherapy;Electrical Stimulation;Moist Heat;Ultrasound;Therapeutic exercise;Therapeutic activities;Functional mobility training;Patient/family education;Manual techniques;Passive range of motion;Vasopneumatic Device;Taping;Dry needling   PT Next Visit Plan progress AROM/strength of R shoulder; modalities prn for pain; manual stretching   Consulted and Agree with Plan of Care Patient      Patient will benefit from skilled therapeutic intervention in order to improve the following deficits and impairments:  Decreased activity tolerance, Decreased range of motion, Decreased strength, Impaired UE functional use, Pain  Visit Diagnosis: Right shoulder pain, unspecified chronicity  Stiffness of right shoulder, not elsewhere classified  Abnormal posture  Muscle weakness  (generalized)     Problem List Patient Active Problem List   Diagnosis Date Noted  . 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 Preop cardiovascular exam 07/09/2015  . Chest pain 05/09/2013  . Hypertension 02/23/2012  . Hyperlipidemia 02/23/2012  . Right shoulder pain 01/25/2012  . Left wrist pain 01/16/2012  . Neck pain 12/26/2011    Kipp Laurence, PT, DPT 06/03/16 3:17 PM   [facility] [address]  [address] [place], [place], [address] Phone: [phone]   Fax:  [phone]  Name: [name] [name] MRN: [id number] Date of Birth: [birth year]

## 2016-06-07 ENCOUNTER — Ambulatory Visit: Payer: Medicare Other | Admitting: Physical Therapy

## 2016-06-07 DIAGNOSIS — M6281 Muscle weakness (generalized): Secondary | ICD-10-CM

## 2016-06-07 DIAGNOSIS — M25511 Pain in right shoulder: Secondary | ICD-10-CM

## 2016-06-07 DIAGNOSIS — M25611 Stiffness of right shoulder, not elsewhere classified: Secondary | ICD-10-CM

## 2016-06-07 DIAGNOSIS — R293 Abnormal posture: Secondary | ICD-10-CM

## 2016-06-07 NOTE — Therapy (Signed)
[facility] [address]  [address] [place], [place], [address] Phone: [phone]   Fax:  [phone]  Physical Therapy Treatment  Patient Details  Name: [name] [name] MRN: [id number] Date of Birth: 06-Jul-1945 Referring Provider: Dr. Mckinley Jewel  Encounter Date: 06/07/2016      PT End of Session - 06/07/16 1127    Visit Number 20   Number of Visits 28   Date for PT Re-Evaluation 07/09/16   PT Start Time 1100   PT Stop Time 1202   PT Time Calculation (min) 62 min   Activity Tolerance Patient tolerated treatment well   Behavior During Therapy [facility] for tasks assessed/performed      Past Medical History:  Diagnosis Date  . Allergy   . Anxiety   . Arthritis   . Asthma    Bronchial   . Celiac disease   . Full dentures   . Headache    migraines  . History of hiatal hernia   . Hyperlipidemia   . Hypertension   . IBS (irritable bowel syndrome)   . Pneumonia    "years ago"  . PONV (postoperative nausea and vomiting)   . Wears glasses    readers    Past Surgical History:  Procedure Laterality Date  . ABDOMINAL HYSTERECTOMY    . APPENDECTOMY    . BREAST LUMPECTOMY  1985   lt  . CARPAL TUNNEL RELEASE     rt  . CHOLECYSTECTOMY    . ELBOW SURGERY    . EYE SURGERY     both cataracts  . KNEE SURGERY     lt  . NASAL SEPTUM SURGERY    . REVERSE SHOULDER ARTHROPLASTY Right 02/18/2016   Procedure: RIGHT REVERSE TOTAL SHOULDER ARTHROPLASTY;  Surgeon: Loreta Ave, MD;  Location: [facility] OR;  Service: Orthopedics;  Laterality: Right;  . REVERSE TOTAL SHOULDER ARTHROPLASTY Right 02/18/2016  . SHOULDER ACROMIOPLASTY Right 05/09/2014   Procedure: SHOULDER ACROMIOPLASTY;  Surgeon: Loreta Ave, MD;  Location: [facility];  Service: Orthopedics;  Laterality: Right;  . SHOULDER ARTHROSCOPY WITH BICEPSTENOTOMY Right 05/09/2014   Procedure: SHOULDER ARTHROSCOPY WITH BICEPSTENOTOMY;  Surgeon: Loreta Ave, MD;   Location: [facility];  Service: Orthopedics;  Laterality: Right;  Debridement Rotator Cuff Tear  . SHOULDER ARTHROSCOPY WITH ROTATOR CUFF REPAIR Right 12/19/2014   Procedure: RIGHT SHOULDER ARTHROSCOPY WITH ROTATOR CUFF REPAIR,CALCIFIC DEBRIDEMENT EXTENSIVE;  Surgeon: Loreta Ave, MD;  Location: [facility];  Service: Orthopedics;  Laterality: Right;  . SHOULDER ARTHROSCOPY WITH ROTATOR CUFF REPAIR Right 07/10/2015   Procedure: RIGHT SHOULDER ARTHROSCOPY, DEBRIDEMENT  WITH ROTATOR CUFF REPAIR;  Surgeon: Loreta Ave, MD;  Location: [facility];  Service: Orthopedics;  Laterality: Right;  . SHOULDER SURGERY     left  . TONSILLECTOMY AND ADENOIDECTOMY    . VARICOSE VEIN SURGERY    . WRIST ARTHROSCOPY Right 09/20/2013   Procedure: RIGHT ARTHROSCOPY WRIST EXCISION PISIFORM;  Surgeon: Wyn Forster, MD;  Location: [facility];  Service: Orthopedics;  Laterality: Right;    There were no vitals filed for this visit.      Subjective Assessment - 06/07/16 1119    Subjective continued elbow pain - brusing left from taping   Patient Stated Goals to improve shoulder function   Currently in Pain? Yes   Pain Score 4    Pain Location Shoulder   Pain Orientation Right  Pain Descriptors / Indicators Aching;Tightness   Pain Type Acute pain   Pain Onset More than a month ago   Pain Frequency Constant   Aggravating Factors  HEP, dressing   Pain Relieving Factors ice, rest                         [facility] Adult PT Treatment/Exercise - 06/07/16 0001      Shoulder Exercises: Supine   Flexion AROM;Right;15 reps;Weights   Shoulder Flexion Weight (lbs) 3     Shoulder Exercises: Seated   Flexion AROM;Right;20 reps;Weights   Flexion Weight (lbs) 1   Abduction AROM;Right;20 reps;Weights   ABduction Weight (lbs) 1     Shoulder Exercises: Prone   Retraction Strengthening;Right;15 reps;Weights   Retraction Weight (lbs) 2    Retraction Limitations prone row with scap squeeze   Extension Strengthening;Right;15 reps;Weights   Extension Weight (lbs) 1   Horizontal ABduction 1 Strengthening;Right;15 reps;Weights   Horizontal ABduction 1 Weight (lbs) 1   Horizontal ABduction 1 Limitations with scap squeeze - T's     Shoulder Exercises: Sidelying   ABduction AROM;Right;15 reps;Weights   ABduction Weight (lbs) 3     Shoulder Exercises: Pulleys   Flexion 3 minutes   ABduction 3 minutes     Modalities   Modalities Electrical Stimulation;Moist Heat;Vasopneumatic     Moist Heat Therapy   Number Minutes Moist Heat 15 Minutes   Moist Heat Location Shoulder     Electrical Stimulation   Electrical Stimulation Location R UT, R anterior shoulder, R lateral borer of scapula, and R deltoid area   Electrical Stimulation Action IFC   Electrical Stimulation Parameters to tolerance    Electrical Stimulation Goals Pain     Vasopneumatic   Number Minutes Vasopneumatic  15 minutes   Vasopnuematic Location  Shoulder   Vasopneumatic Pressure Medium   Vasopneumatic Temperature  coldest temp     Manual Therapy   Manual Therapy Passive ROM   Passive ROM PROM into flexion and abduction with contract/relax - 10 x 5 sec holds; PROM into IR/ER 10 x 10 sec holds                  PT Short Term Goals - 05/11/16 1239      PT SHORT TERM GOAL #1   Title patient to be independent with HEP (04/14/16)   Status Achieved     PT SHORT TERM GOAL #2   Title Patient to improve R shoulder PROM flexion to 120 and ER to 50 (in scapular plane) (04/14/16)   Status Achieved           PT Long Term Goals - 05/25/16 1522      PT LONG TERM GOAL #1   Title patient to be independent with advanced HEP (07/09/16)   Status On-going     PT LONG TERM GOAL #2   Title Patient to improve AROM of R shoulder to 140 deg flexion in sitting (07/09/16)   Status Revised     PT LONG TERM GOAL #3   Title Patient to demonstrate proper postural  alignment with ability to self correct (05/12/16)   Status Achieved     PT LONG TERM GOAL #4   Title Patient to demonstrate ability to reach cabinet of shoulder height with minimal compensation (07/09/16)   Status On-going     PT LONG TERM GOAL #5   Title Patient to report improved functional use of R UE (bathing, hair care,  dressing) with minimal compensations (07/09/16)   Status New               Plan - 06-22-16 1127    Clinical Impression Statement Patient doing well today - continues to have R elbow pain. PT calling MD to attain possible verbal/written order for ionto - with MD not replying while patient is treatment - will wait upon order before applying. Patient tolerating all stretching today with contract/relax, as well as strengthening of R shoulder with motion continuing to be limited. Patient to continue to benefit form PT to maximize functional use of R UE.    PT Treatment/Interventions ADLs/Self Care Home Management;Cryotherapy;Electrical Stimulation;Moist Heat;Ultrasound;Therapeutic exercise;Therapeutic activities;Functional mobility training;Patient/family education;Manual techniques;Passive range of motion;Vasopneumatic Device;Taping;Dry needling   PT Next Visit Plan progress AROM/strength of R shoulder; modalities prn for pain; manual stretching   Consulted and Agree with Plan of Care Patient      Patient will benefit from skilled therapeutic intervention in order to improve the following deficits and impairments:  Decreased activity tolerance, Decreased range of motion, Decreased strength, Impaired UE functional use, Pain  Visit Diagnosis: Right shoulder pain, unspecified chronicity  Stiffness of right shoulder, not elsewhere classified  Abnormal posture  Muscle weakness (generalized)       G-Codes - 06-22-16 1129    Functional Assessment Tool Used (Outpatient Only) FOTO: 47 (53% limited)   Functional Limitation Carrying, moving and handling objects    Carrying, Moving and Handling Objects Current Status (Z6109) At least 40 percent but less than 60 percent impaired, limited or restricted   Carrying, Moving and Handling Objects Goal Status (U0454) At least 40 percent but less than 60 percent impaired, limited or restricted      Problem List Patient Active Problem List   Diagnosis Date Noted  . 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 Preop cardiovascular exam 07/09/2015  . Chest pain 05/09/2013  . Hypertension 02/23/2012  . Hyperlipidemia 02/23/2012  . Right shoulder pain 01/25/2012  . Left wrist pain 01/16/2012  . Neck pain 12/26/2011    Kipp Laurence, PT, DPT 2016-06-22 3:06 PM   [facility] [address]  [address] [place], [place], [address] Phone: [phone]   Fax:  [phone]  Name: JOSEE SPEECE MRN: [id number] Date of Birth: [birth year]

## 2016-06-08 ENCOUNTER — Ambulatory Visit: Payer: Medicare Other

## 2016-06-08 DIAGNOSIS — M25511 Pain in right shoulder: Secondary | ICD-10-CM

## 2016-06-08 DIAGNOSIS — R293 Abnormal posture: Secondary | ICD-10-CM

## 2016-06-08 DIAGNOSIS — M6281 Muscle weakness (generalized): Secondary | ICD-10-CM

## 2016-06-08 DIAGNOSIS — M25611 Stiffness of right shoulder, not elsewhere classified: Secondary | ICD-10-CM

## 2016-06-08 NOTE — Therapy (Signed)
[facility] [address]  [address] [place], [place], [address] Phone: [phone]   Fax:  [phone]  Physical Therapy Treatment  Patient Details  Name: [name] [name] MRN: [id number] Date of Birth: [birth year] Referring Provider: Dr. Mckinley Jewel  Encounter Date: 06/08/2016      PT End of Session - 06/08/16 1111    Visit Number 21  moist heat while in waiting room    Number of Visits 28   Date for PT Re-Evaluation 07/09/16   PT Start Time 1101   PT Stop Time 1207   PT Time Calculation (min) 66 min   Activity Tolerance Patient tolerated treatment well   Behavior During Therapy [facility] for tasks assessed/performed      Past Medical History:  Diagnosis Date  . Allergy   . Anxiety   . Arthritis   . Asthma    Bronchial   . Celiac disease   . Full dentures   . Headache    migraines  . History of hiatal hernia   . Hyperlipidemia   . Hypertension   . IBS (irritable bowel syndrome)   . Pneumonia    "years ago"  . PONV (postoperative nausea and vomiting)   . Wears glasses    readers    Past Surgical History:  Procedure Laterality Date  . ABDOMINAL HYSTERECTOMY    . APPENDECTOMY    . BREAST LUMPECTOMY  1985   lt  . CARPAL TUNNEL RELEASE     rt  . CHOLECYSTECTOMY    . ELBOW SURGERY    . EYE SURGERY     both cataracts  . KNEE SURGERY     lt  . NASAL SEPTUM SURGERY    . REVERSE SHOULDER ARTHROPLASTY Right 02/18/2016   Procedure: RIGHT REVERSE TOTAL SHOULDER ARTHROPLASTY;  Surgeon: Loreta Ave, MD;  Location: [facility] OR;  Service: Orthopedics;  Laterality: Right;  . REVERSE TOTAL SHOULDER ARTHROPLASTY Right 02/18/2016  . SHOULDER ACROMIOPLASTY Right 05/09/2014   Procedure: SHOULDER ACROMIOPLASTY;  Surgeon: Loreta Ave, MD;  Location: [facility];  Service: Orthopedics;  Laterality: Right;  . SHOULDER ARTHROSCOPY WITH BICEPSTENOTOMY Right 05/09/2014   Procedure: SHOULDER ARTHROSCOPY WITH  BICEPSTENOTOMY;  Surgeon: Loreta Ave, MD;  Location: [facility];  Service: Orthopedics;  Laterality: Right;  Debridement Rotator Cuff Tear  . SHOULDER ARTHROSCOPY WITH ROTATOR CUFF REPAIR Right 12/19/2014   Procedure: RIGHT SHOULDER ARTHROSCOPY WITH ROTATOR CUFF REPAIR,CALCIFIC DEBRIDEMENT EXTENSIVE;  Surgeon: Loreta Ave, MD;  Location: [facility];  Service: Orthopedics;  Laterality: Right;  . SHOULDER ARTHROSCOPY WITH ROTATOR CUFF REPAIR Right 07/10/2015   Procedure: RIGHT SHOULDER ARTHROSCOPY, DEBRIDEMENT  WITH ROTATOR CUFF REPAIR;  Surgeon: Loreta Ave, MD;  Location: [facility];  Service: Orthopedics;  Laterality: Right;  . SHOULDER SURGERY     left  . TONSILLECTOMY AND ADENOIDECTOMY    . VARICOSE VEIN SURGERY    . WRIST ARTHROSCOPY Right 09/20/2013   Procedure: RIGHT ARTHROSCOPY WRIST EXCISION PISIFORM;  Surgeon: Wyn Forster, MD;  Location: [facility];  Service: Orthopedics;  Laterality: Right;    There were no vitals filed for this visit.      Subjective Assessment - 06/08/16 1103    Subjective R elbow pain still chief complaint today.    Patient Stated Goals to improve shoulder function   Currently in Pain? Yes   Pain Score 4    Pain Location  Shoulder   Pain Orientation Right   Pain Descriptors / Indicators Aching;Tightness   Pain Onset More than a month ago   Multiple Pain Sites No            [facility] PT Assessment - 06/08/16 1159      Assessment   Next MD Visit 3.20.18           [facility]/Ex[facility] - 06/08/16 1112      Shoulder Exercises: Seated   Flexion AROM;Right;20 reps;Weights   Flexion Weight (lbs) 1   Abduction AROM;Right;20 reps;Weights   ABduction Weight (lbs) 1     Shoulder Exercises: Prone   Retraction Strengthening;Right;Weights;20 reps   Retraction Weight (lbs) 2   Retraction Limitations prone row with scap squeeze   Extension  Strengthening;Right;Weights;20 reps   Extension Weight (lbs) 1   Horizontal ABduction 1 Strengthening;Right;Weights;20 reps   Horizontal ABduction 1 Weight (lbs) 1   Horizontal ABduction 1 Limitations with scap squeeze - T's     Shoulder Exercises: Sidelying   External Rotation AROM;Right;20 reps   External Rotation Weight (lbs) 2   External Rotation Limitations with scap squeeze   ABduction AROM;Right;Weights;20 reps   ABduction Weight (lbs) 3   Other Sidelying Exercises L sidelying R shoulder protraction in 90 dg abduction position 3# x 20 reps   Other Sidelying Exercises L sidelying R shoulder circles in 90 dg abduction position CW, CCW 3# x 20 reps each way     Shoulder Exercises: Standing   Flexion AROM;20 reps;Right;Strengthening;Weights  2nd set with 2# L UE assist for concentric; next to wall   Shoulder Flexion Weight (lbs) 1   ABduction AROM;Right;20 reps;Weights  tactile cues to prevent trunk lean; next to wall   Shoulder ABduction Weight (lbs) 1   Other Standing Exercises --   Other Standing Exercises --     Shoulder Exercises: Pulleys   Flexion 3 minutes   ABduction 3 minutes     Modalities   Modalities Electrical Stimulation;Moist Heat;Vasopneumatic     Moist Heat Therapy   Number Minutes Moist Heat 15 Minutes   Moist Heat Location Shoulder     Electrical Stimulation   Electrical Stimulation Location R UT, R anterior shoulder, R lateral borer of scapula, and R deltoid area   Electrical Stimulation Action IFC    Electrical Stimulation Parameters to tolerance   15 min    Electrical Stimulation Goals Pain     Vasopneumatic   Number Minutes Vasopneumatic  15 minutes   Vasopnuematic Location  Shoulder   Vasopneumatic Pressure Medium   Vasopneumatic Temperature  coldest temp                  PT Short Term Goals - 05/11/16 1239      PT SHORT TERM GOAL #1   Title patient to be independent with HEP (04/14/16)   Status Achieved     PT SHORT TERM  GOAL #2   Title Patient to improve R shoulder PROM flexion to 120 and ER to 50 (in scapular plane) (04/14/16)   Status Achieved           PT Long Term Goals - 05/25/16 1522      PT LONG TERM GOAL #1   Title patient to be independent with advanced HEP (07/09/16)   Status On-going     PT LONG TERM GOAL #2   Title Patient to improve AROM of R shoulder to 140 deg flexion in sitting (07/09/16)   Status Revised  PT LONG TERM GOAL #3   Title Patient to demonstrate proper postural alignment with ability to self correct (05/12/16)   Status Achieved     PT LONG TERM GOAL #4   Title Patient to demonstrate ability to reach cabinet of shoulder height with minimal compensation (07/09/16)   Status On-going     PT LONG TERM GOAL #5   Title Patient to report improved functional use of R UE (bathing, hair care, dressing) with minimal compensations (07/09/16)   Status New           Plan - 06/08/16 1125    Clinical Impression Statement Pt. doing well initially today with chief complaint still R elbow pain.  MD office faxing order for ionto approval however this order was unsigned by MD.  MD office to refax signed order pending.  Pt. able to progress repetitions with nearly all conservative strengthening activities today.  Abduction and flexion wall ball roll added today and tolerated well.  Treatment ending with ice/E-stim combo with pt. leaving treatment with pain well controlled.  Pt. will continue to benefit from further skilled therapy to maximize UE strength and function.     PT Treatment/Interventions ADLs/Self Care Home Management;Cryotherapy;Electrical Stimulation;Moist Heat;Ultrasound;Therapeutic exercise;Therapeutic activities;Functional mobility training;Patient/family education;Manual techniques;Passive range of motion;Vasopneumatic Device;Taping;Dry needling   PT Next Visit Plan progress AROM/strength of R shoulder; modalities prn for pain; manual stretching      Patient will benefit  from skilled therapeutic intervention in order to improve the following deficits and impairments:  Decreased activity tolerance, Decreased range of motion, Decreased strength, Impaired UE functional use, Pain  Visit Diagnosis: Right shoulder pain, unspecified chronicity  Stiffness of right shoulder, not elsewhere classified  Abnormal posture  Muscle weakness (generalized)    Problem List Patient Active Problem List   Diagnosis Date Noted  . 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 Preop cardiovascular exam 07/09/2015  . Chest pain 05/09/2013  . Hypertension 02/23/2012  . Hyperlipidemia 02/23/2012  . Right shoulder pain 01/25/2012  . Left wrist pain 01/16/2012  . Neck pain 12/26/2011    Kermit Balo, PTA 06/08/16 12:36 PM  [facility] [address]  [address] [place], [place], [address] Phone: [phone]   Fax:  [phone]  Name: [name] [name] MRN: [id number] Date of Birth: 21-Jun-1945

## 2016-06-10 ENCOUNTER — Ambulatory Visit: Payer: Medicare Other | Attending: Orthopedic Surgery | Admitting: Physical Therapy

## 2016-06-10 DIAGNOSIS — R293 Abnormal posture: Secondary | ICD-10-CM | POA: Diagnosis present

## 2016-06-10 DIAGNOSIS — M6281 Muscle weakness (generalized): Secondary | ICD-10-CM | POA: Diagnosis present

## 2016-06-10 DIAGNOSIS — M25511 Pain in right shoulder: Secondary | ICD-10-CM | POA: Diagnosis not present

## 2016-06-10 DIAGNOSIS — M25611 Stiffness of right shoulder, not elsewhere classified: Secondary | ICD-10-CM | POA: Diagnosis present

## 2016-06-10 NOTE — Therapy (Signed)
[facility] Outpatient Rehabilitation [facility] [address]  [address] [place], [place], [address] Phone: [phone]   Fax:  [phone]  Physical Therapy Treatment  Patient Details  Name: [name] [name] MRN: [id number] Date of Birth: January 23, 1946 Referring Provider: Dr. Mckinley Jewel  Encounter Date: 06/10/2016      PT End of Session - 06/10/16 1101    Visit Number 22   Number of Visits 28   Date for PT Re-Evaluation 07/09/16   PT Start Time 1058   PT Stop Time 1202   PT Time Calculation (min) 64 min   Activity Tolerance Patient tolerated treatment well   Behavior During Therapy [facility] for tasks assessed/performed      Past Medical History:  Diagnosis Date  . Allergy   . Anxiety   . Arthritis   . Asthma    Bronchial   . Celiac disease   . Full dentures   . Headache    migraines  . History of hiatal hernia   . Hyperlipidemia   . Hypertension   . IBS (irritable bowel syndrome)   . Pneumonia    "years ago"  . PONV (postoperative nausea and vomiting)   . Wears glasses    readers    Past Surgical History:  Procedure Laterality Date  . ABDOMINAL HYSTERECTOMY    . APPENDECTOMY    . BREAST LUMPECTOMY  1985   lt  . CARPAL TUNNEL RELEASE     rt  . CHOLECYSTECTOMY    . ELBOW SURGERY    . EYE SURGERY     both cataracts  . KNEE SURGERY     lt  . NASAL SEPTUM SURGERY    . REVERSE SHOULDER ARTHROPLASTY Right 02/18/2016   Procedure: RIGHT REVERSE TOTAL SHOULDER ARTHROPLASTY;  Surgeon: Loreta Ave, MD;  Location: [facility] OR;  Service: Orthopedics;  Laterality: Right;  . REVERSE TOTAL SHOULDER ARTHROPLASTY Right 02/18/2016  . SHOULDER ACROMIOPLASTY Right 05/09/2014   Procedure: SHOULDER ACROMIOPLASTY;  Surgeon: Loreta Ave, MD;  Location: [facility];  Service: Orthopedics;  Laterality: Right;  . SHOULDER ARTHROSCOPY WITH BICEPSTENOTOMY Right 05/09/2014   Procedure: SHOULDER ARTHROSCOPY WITH BICEPSTENOTOMY;  Surgeon: Loreta Ave, MD;   Location: [facility];  Service: Orthopedics;  Laterality: Right;  Debridement Rotator Cuff Tear  . SHOULDER ARTHROSCOPY WITH ROTATOR CUFF REPAIR Right 12/19/2014   Procedure: RIGHT SHOULDER ARTHROSCOPY WITH ROTATOR CUFF REPAIR,CALCIFIC DEBRIDEMENT EXTENSIVE;  Surgeon: Loreta Ave, MD;  Location: [facility];  Service: Orthopedics;  Laterality: Right;  . SHOULDER ARTHROSCOPY WITH ROTATOR CUFF REPAIR Right 07/10/2015   Procedure: RIGHT SHOULDER ARTHROSCOPY, DEBRIDEMENT  WITH ROTATOR CUFF REPAIR;  Surgeon: Loreta Ave, MD;  Location: [facility];  Service: Orthopedics;  Laterality: Right;  . SHOULDER SURGERY     left  . TONSILLECTOMY AND ADENOIDECTOMY    . VARICOSE VEIN SURGERY    . WRIST ARTHROSCOPY Right 09/20/2013   Procedure: RIGHT ARTHROSCOPY WRIST EXCISION PISIFORM;  Surgeon: Wyn Forster, MD;  Location: [facility];  Service: Orthopedics;  Laterality: Right;    There were no vitals filed for this visit.      Subjective Assessment - 06/10/16 1100    Subjective had some soreness following last session - did exercises prior to coming today   Patient Stated Goals to improve shoulder function   Currently in Pain? Yes   Pain Score 4    Pain Location Shoulder  Pain Orientation Right   Pain Descriptors / Indicators Aching;Tightness   Pain Type Acute pain   Pain Onset More than a month ago   Pain Frequency Constant   Aggravating Factors  HEP, dressing   Pain Relieving Factors ice, rest                         [facility] Adult PT Treatment/Exercise - 06/10/16 1104      Shoulder Exercises: Seated   Flexion Strengthening;Right;15 reps;Weights   Flexion Weight (lbs) 2   Abduction Strengthening;Right;15 reps;Weights   ABduction Weight (lbs) 2   Other Seated Exercises seated flexion AROM with 1# into available range - AAROM by PT into further range - static hold and eccentric lowering by patient x 10 reps;  seated abduction AROM with 1# into available range - AAROM by PT into further range - static hold and eccentric lowering by patient x 10 reps;      Shoulder Exercises: Prone   Retraction Strengthening;Right;Weights;20 reps   Retraction Weight (lbs) 2   Retraction Limitations prone row with scap squeeze   Extension Strengthening;Right;Weights;20 reps   Extension Weight (lbs) 2   Extension Limitations with scap squeeze - I's (not past hip level)   Horizontal ABduction 1 Strengthening;Right;Weights;20 reps   Horizontal ABduction 1 Weight (lbs) 2   Horizontal ABduction 1 Limitations with scap squeeze - T's     Shoulder Exercises: Standing   Flexion AAROM;Right;15 reps   Flexion Limitations wall wash - with 5 second hold     Shoulder Exercises: Pulleys   Flexion 3 minutes   ABduction 3 minutes     Shoulder Exercises: ROM/Strengthening   Rhythmic Stabilization, Supine red med ball - CW x 20, CCW x 20, A-Z     Modalities   Modalities Electrical Stimulation;Moist Heat;Vasopneumatic;Iontophoresis     Moist Heat Therapy   Number Minutes Moist Heat 15 Minutes   Moist Heat Location Shoulder  prior to session     Electrical Stimulation   Electrical Stimulation Location R UT, R anterior shoulder, R lateral borer of scapula, and R deltoid area   Electrical Stimulation Action IFC   Electrical Stimulation Parameters to tolerance   Electrical Stimulation Goals Pain     Iontophoresis   Type of Iontophoresis Dexamethasone   Location R elbow   Dose 1.0 mL   Time 80 mA 4-6 hours     Vasopneumatic   Number Minutes Vasopneumatic  15 minutes   Vasopnuematic Location  Shoulder   Vasopneumatic Pressure Medium   Vasopneumatic Temperature  coldest temp                  PT Short Term Goals - 05/11/16 1239      PT SHORT TERM GOAL #1   Title patient to be independent with HEP (04/14/16)   Status Achieved     PT SHORT TERM GOAL #2   Title Patient to improve R shoulder PROM flexion  to 120 and ER to 50 (in scapular plane) (04/14/16)   Status Achieved           PT Long Term Goals - 05/25/16 1522      PT LONG TERM GOAL #1   Title patient to be independent with advanced HEP (07/09/16)   Status On-going     PT LONG TERM GOAL #2   Title Patient to improve AROM of R shoulder to 140 deg flexion in sitting (07/09/16)   Status Revised  PT LONG TERM GOAL #3   Title Patient to demonstrate proper postural alignment with ability to self correct (05/12/16)   Status Achieved     PT LONG TERM GOAL #4   Title Patient to demonstrate ability to reach cabinet of shoulder height with minimal compensation (07/09/16)   Status On-going     PT LONG TERM GOAL #5   Title Patient to report improved functional use of R UE (bathing, hair care, dressing) with minimal compensations (07/09/16)   Status New               Plan - 06/10/16 1101    Clinical Impression Statement PT receiving verbal order for ionto use for patient by MD on 06/07/16, continuing to await signed order. Patient doing well today with mobility and strengthening activities with tightness and overall weakness continuing to be main limiting factor. Ionto patch applied to R medial elbow with precautions reviewed with patient with patient verbally understanding and wanting to proceed with placement of patch. Patient to continue to benefit from PT to maximize funcitonal use of R UE.    PT Treatment/Interventions ADLs/Self Care Home Management;Cryotherapy;Electrical Stimulation;Moist Heat;Ultrasound;Therapeutic exercise;Therapeutic activities;Functional mobility training;Patient/family education;Manual techniques;Passive range of motion;Vasopneumatic Device;Taping;Dry needling;Iontophoresis 4mg /ml Dexamethasone   PT Next Visit Plan progress AROM/strength of R shoulder; modalities prn for pain; manual stretching   Consulted and Agree with Plan of Care Patient      Patient will benefit from skilled therapeutic intervention  in order to improve the following deficits and impairments:  Decreased activity tolerance, Decreased range of motion, Decreased strength, Impaired UE functional use, Pain  Visit Diagnosis: Right shoulder pain, unspecified chronicity  Stiffness of right shoulder, not elsewhere classified  Abnormal posture  Muscle weakness (generalized)     Problem List Patient Active Problem List   Diagnosis Date Noted  . 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 Preop cardiovascular exam 07/09/2015  . Chest pain 05/09/2013  . Hypertension 02/23/2012  . Hyperlipidemia 02/23/2012  . Right shoulder pain 01/25/2012  . Left wrist pain 01/16/2012  . Neck pain 12/26/2011    Kipp LaurenceStephanie R Aaron, PT, DPT 06/10/16 2:37 PM   [facility] [place] [address]  [address] [place], [place], [address] Phone: [phone]   Fax:  [phone]  Name: Wellington HampshireLynn C Dierks MRN: [id number] Date of Birth: 1945-11-21

## 2016-06-15 ENCOUNTER — Ambulatory Visit: Payer: Medicare Other

## 2016-06-15 DIAGNOSIS — M25611 Stiffness of right shoulder, not elsewhere classified: Secondary | ICD-10-CM

## 2016-06-15 DIAGNOSIS — M25511 Pain in right shoulder: Secondary | ICD-10-CM

## 2016-06-15 DIAGNOSIS — M6281 Muscle weakness (generalized): Secondary | ICD-10-CM

## 2016-06-15 DIAGNOSIS — R293 Abnormal posture: Secondary | ICD-10-CM

## 2016-06-15 NOTE — Therapy (Signed)
[facility] Outpatient Rehabilitation [facility] [address]  [address] [place], [place], [address] Phone: [phone]   Fax:  [phone]  Physical Therapy Treatment  Patient Details  Name: [name] [name] MRN: [id number] Date of Birth: [birth year] Referring Provider: Dr. Mckinley Jewel  Encounter Date: 06/15/2016      PT End of Session - 06/15/16 1107    Visit Number 23   Number of Visits 28   Date for PT Re-Evaluation 07/09/16   PT Start Time 1100  15 min moist heat prior to tx    PT Stop Time 1202   PT Time Calculation (min) 62 min   Activity Tolerance Patient tolerated treatment well   Behavior During Therapy [facility] for tasks assessed/performed      Past Medical History:  Diagnosis Date  . Allergy   . Anxiety   . Arthritis   . Asthma    Bronchial   . Celiac disease   . Full dentures   . Headache    migraines  . History of hiatal hernia   . Hyperlipidemia   . Hypertension   . IBS (irritable bowel syndrome)   . Pneumonia    "years ago"  . PONV (postoperative nausea and vomiting)   . Wears glasses    readers    Past Surgical History:  Procedure Laterality Date  . ABDOMINAL HYSTERECTOMY    . APPENDECTOMY    . BREAST LUMPECTOMY  1985   lt  . CARPAL TUNNEL RELEASE     rt  . CHOLECYSTECTOMY    . ELBOW SURGERY    . EYE SURGERY     both cataracts  . KNEE SURGERY     lt  . NASAL SEPTUM SURGERY    . REVERSE SHOULDER ARTHROPLASTY Right 02/18/2016   Procedure: RIGHT REVERSE TOTAL SHOULDER ARTHROPLASTY;  Surgeon: Loreta Ave, MD;  Location: [facility] OR;  Service: Orthopedics;  Laterality: Right;  . REVERSE TOTAL SHOULDER ARTHROPLASTY Right 02/18/2016  . SHOULDER ACROMIOPLASTY Right 05/09/2014   Procedure: SHOULDER ACROMIOPLASTY;  Surgeon: Loreta Ave, MD;  Location: [facility];  Service: Orthopedics;  Laterality: Right;  . SHOULDER ARTHROSCOPY WITH BICEPSTENOTOMY Right 05/09/2014   Procedure: SHOULDER ARTHROSCOPY WITH  BICEPSTENOTOMY;  Surgeon: Loreta Ave, MD;  Location: [facility];  Service: Orthopedics;  Laterality: Right;  Debridement Rotator Cuff Tear  . SHOULDER ARTHROSCOPY WITH ROTATOR CUFF REPAIR Right 12/19/2014   Procedure: RIGHT SHOULDER ARTHROSCOPY WITH ROTATOR CUFF REPAIR,CALCIFIC DEBRIDEMENT EXTENSIVE;  Surgeon: Loreta Ave, MD;  Location: [facility];  Service: Orthopedics;  Laterality: Right;  . SHOULDER ARTHROSCOPY WITH ROTATOR CUFF REPAIR Right 07/10/2015   Procedure: RIGHT SHOULDER ARTHROSCOPY, DEBRIDEMENT  WITH ROTATOR CUFF REPAIR;  Surgeon: Loreta Ave, MD;  Location: [facility];  Service: Orthopedics;  Laterality: Right;  . SHOULDER SURGERY     left  . TONSILLECTOMY AND ADENOIDECTOMY    . VARICOSE VEIN SURGERY    . WRIST ARTHROSCOPY Right 09/20/2013   Procedure: RIGHT ARTHROSCOPY WRIST EXCISION PISIFORM;  Surgeon: Wyn Forster, MD;  Location: [facility];  Service: Orthopedics;  Laterality: Right;    There were no vitals filed for this visit.      Subjective Assessment - 06/15/16 1104    Subjective Pt. reporting benefit from ionto patch applied to elbow last treatment.     Patient Stated Goals to improve shoulder function   Currently in Pain? Yes   Pain Score  4    Pain Location Shoulder   Pain Orientation Right   Pain Descriptors / Indicators Aching;Tightness   Pain Type Acute pain   Pain Onset More than a month ago   Pain Frequency Constant   Aggravating Factors  HEP, dressing    Multiple Pain Sites No              [facility] Adult PT Treatment/Exercise - 06/15/16 1114      Shoulder Exercises: Prone   Retraction Strengthening;Right;Weights;20 reps   Retraction Weight (lbs) 3   Retraction Limitations prone row with scap squeeze   Extension Strengthening;Right;Weights;20 reps   Extension Weight (lbs) 2   Extension Limitations with scap squeeze - I's (not past hip level)   Horizontal ABduction 1  Strengthening;Right;Weights;20 reps   Horizontal ABduction 1 Weight (lbs) 2   Horizontal ABduction 1 Limitations with scap squeeze - T's     Shoulder Exercises: Sidelying   External Rotation AROM;Right;20 reps  Pt. unable to perform with 3# with good technique   External Rotation Weight (lbs) 2   External Rotation Limitations with scap squeeze   ABduction AROM;Right;Weights;10 reps   ABduction Weight (lbs) 4   Other Sidelying Exercises L sidelying R shoulder circles in 90 dg abduction position CW, CCW 3# x 20 reps each way     Shoulder Exercises: Standing   Flexion AAROM;Right;20 reps   Shoulder Flexion Weight (lbs) 2   ABduction AROM;Right;20 reps;Weights   Shoulder ABduction Weight (lbs) 2   Extension AROM;10 reps;Theraband   Theraband Level (Shoulder Extension) Level 3 (Green)   Extension Limitations 3" hold    Other Standing Exercises Standing R shoulder wall wash with arc at 90 dg flexion x 20 reps with wash cloth    Other Standing Exercises Standing side<>side wall walk with yellow TB around wrists 2 x 20 ft     Shoulder Exercises: Pulleys   Flexion 3 minutes   ABduction 3 minutes     Shoulder Exercises: ROM/Strengthening   Rhythmic Stabilization, Supine Supine R Shoulder ABC's with 3 # x 2 reps     Moist Heat Therapy   Number Minutes Moist Heat 15 Minutes   Moist Heat Location Shoulder  prior to session      Iontophoresis   Type of Iontophoresis Dexamethasone  patch #2/6   Location R elbow   Dose 1.0 mL   Time 80 mA 4-6 hours     Vasopneumatic   Number Minutes Vasopneumatic  15 minutes   Vasopnuematic Location  Shoulder   Vasopneumatic Pressure Medium   Vasopneumatic Temperature  coldest temp                  PT Short Term Goals - 05/11/16 1239      PT SHORT TERM GOAL #1   Title patient to be independent with HEP (04/14/16)   Status Achieved     PT SHORT TERM GOAL #2   Title Patient to improve R shoulder PROM flexion to 120 and ER to 50 (in  scapular plane) (04/14/16)   Status Achieved           PT Long Term Goals - 05/25/16 1522      PT LONG TERM GOAL #1   Title patient to be independent with advanced HEP (07/09/16)   Status On-going     PT LONG TERM GOAL #2   Title Patient to improve AROM of R shoulder to 140 deg flexion in sitting (07/09/16)   Status Revised  PT LONG TERM GOAL #3   Title Patient to demonstrate proper postural alignment with ability to self correct (05/12/16)   Status Achieved     PT LONG TERM GOAL #4   Title Patient to demonstrate ability to reach cabinet of shoulder height with minimal compensation (07/09/16)   Status On-going     PT LONG TERM GOAL #5   Title Patient to report improved functional use of R UE (bathing, hair care, dressing) with minimal compensations (07/09/16)   Status New               Plan - 06/15/16 1128    Clinical Impression Statement Pt. reporting benefit from ionto patch applied last treatment thus ionto patch #2/6 applied today.  Today's treatment with progression of scapular/RTC strengthening activity with pt. tolerating well.  Larita FifeLynn still with, "ice pick pain" with eccentric lowering during flexion, abduction motions against gravity however elbow pain remains chief complaint with activity.  Pt. reporting she feels benefit from ice however feels limited benefit from E-stim thus treatment ending with Ice today.  Pt. will continue to benefit from further skilled therapy to maximize function.     PT Treatment/Interventions ADLs/Self Care Home Management;Cryotherapy;Electrical Stimulation;Moist Heat;Ultrasound;Therapeutic exercise;Therapeutic activities;Functional mobility training;Patient/family education;Manual techniques;Passive range of motion;Vasopneumatic Device;Taping;Dry needling;Iontophoresis 4mg /ml Dexamethasone   PT Next Visit Plan progress AROM/strength of R shoulder; modalities prn for pain; manual stretching      Patient will benefit from skilled therapeutic  intervention in order to improve the following deficits and impairments:  Decreased activity tolerance, Decreased range of motion, Decreased strength, Impaired UE functional use, Pain  Visit Diagnosis: Right shoulder pain, unspecified chronicity  Stiffness of right shoulder, not elsewhere classified  Abnormal posture  Muscle weakness (generalized)     Problem List Patient Active Problem List   Diagnosis Date Noted  . 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 Preop cardiovascular exam 07/09/2015  . Chest pain 05/09/2013  . Hypertension 02/23/2012  . Hyperlipidemia 02/23/2012  . Right shoulder pain 01/25/2012  . Left wrist pain 01/16/2012  . Neck pain 12/26/2011    Kermit BaloMicah Darielle Hancher, PTA 06/15/16 12:23 PM   [facility] [place] [address]  [address] [place], [place], [address] Phone: [phone]   Fax:  [phone]  Name: Wellington HampshireLynn C Kovar MRN: [id number] Date of Birth: 06-20-1945

## 2016-06-17 ENCOUNTER — Ambulatory Visit: Payer: Medicare Other | Admitting: Physical Therapy

## 2016-06-17 DIAGNOSIS — M25511 Pain in right shoulder: Secondary | ICD-10-CM

## 2016-06-17 DIAGNOSIS — R293 Abnormal posture: Secondary | ICD-10-CM

## 2016-06-17 DIAGNOSIS — M6281 Muscle weakness (generalized): Secondary | ICD-10-CM

## 2016-06-17 DIAGNOSIS — M25611 Stiffness of right shoulder, not elsewhere classified: Secondary | ICD-10-CM

## 2016-06-17 NOTE — Therapy (Signed)
[facility] Outpatient [facility] [address]  [address] [place], [place], [address] Phone: [phone]   Fax:  [phone]  Physical Therapy Treatment  Patient Details  Name: [name] [name] MRN: [id number] Date of Birth: March 24, 1946 Referring Provider: Dr. Mckinley Jewel  Encounter Date: 06/17/2016      PT End of Session - 06/17/16 1208    Visit Number 24   Number of Visits 28   Date for PT Re-Evaluation 07/09/16   PT Start Time 1102   PT Stop Time 1210   PT Time Calculation (min) 68 min   Activity Tolerance Patient tolerated treatment well   Behavior During Therapy [facility] for tasks assessed/performed      Past Medical History:  Diagnosis Date  . Allergy   . Anxiety   . Arthritis   . Asthma    Bronchial   . Celiac disease   . Full dentures   . Headache    migraines  . History of hiatal hernia   . Hyperlipidemia   . Hypertension   . IBS (irritable bowel syndrome)   . Pneumonia    "years ago"  . PONV (postoperative nausea and vomiting)   . Wears glasses    readers    Past Surgical History:  Procedure Laterality Date  . ABDOMINAL HYSTERECTOMY    . APPENDECTOMY    . BREAST LUMPECTOMY  1985   lt  . CARPAL TUNNEL RELEASE     rt  . CHOLECYSTECTOMY    . ELBOW SURGERY    . EYE SURGERY     both cataracts  . KNEE SURGERY     lt  . NASAL SEPTUM SURGERY    . REVERSE SHOULDER ARTHROPLASTY Right 02/18/2016   Procedure: RIGHT REVERSE TOTAL SHOULDER ARTHROPLASTY;  Surgeon: Loreta Ave, MD;  Location: [facility] OR;  Service: Orthopedics;  Laterality: Right;  . REVERSE TOTAL SHOULDER ARTHROPLASTY Right 02/18/2016  . SHOULDER ACROMIOPLASTY Right 05/09/2014   Procedure: SHOULDER ACROMIOPLASTY;  Surgeon: Loreta Ave, MD;  Location: [facility];  Service: Orthopedics;  Laterality: Right;  . SHOULDER ARTHROSCOPY WITH BICEPSTENOTOMY Right 05/09/2014   Procedure: SHOULDER ARTHROSCOPY WITH BICEPSTENOTOMY;  Surgeon: Loreta Ave, MD;   Location: [facility];  Service: Orthopedics;  Laterality: Right;  Debridement Rotator Cuff Tear  . SHOULDER ARTHROSCOPY WITH ROTATOR CUFF REPAIR Right 12/19/2014   Procedure: RIGHT SHOULDER ARTHROSCOPY WITH ROTATOR CUFF REPAIR,CALCIFIC DEBRIDEMENT EXTENSIVE;  Surgeon: Loreta Ave, MD;  Location: [facility];  Service: Orthopedics;  Laterality: Right;  . SHOULDER ARTHROSCOPY WITH ROTATOR CUFF REPAIR Right 07/10/2015   Procedure: RIGHT SHOULDER ARTHROSCOPY, DEBRIDEMENT  WITH ROTATOR CUFF REPAIR;  Surgeon: Loreta Ave, MD;  Location: [facility];  Service: Orthopedics;  Laterality: Right;  . SHOULDER SURGERY     left  . TONSILLECTOMY AND ADENOIDECTOMY    . VARICOSE VEIN SURGERY    . WRIST ARTHROSCOPY Right 09/20/2013   Procedure: RIGHT ARTHROSCOPY WRIST EXCISION PISIFORM;  Surgeon: Wyn Forster, MD;  Location: [facility];  Service: Orthopedics;  Laterality: Right;    There were no vitals filed for this visit.      Subjective Assessment - 06/17/16 1200    Subjective Continues to have shoulder pain - doesn't think ionto is of benefit at this time   Patient Stated Goals to improve shoulder function   Currently in Pain? Yes   Pain Score 4    Pain Location Shoulder  Pain Orientation Right   Pain Descriptors / Indicators Tightness;Constant                         [facility] Adult PT Treatment/Exercise - 06/17/16 1202      Shoulder Exercises: Seated   Other Seated Exercises seated flexion AROM with 2# into available range - AAROM by PT into further range - static hold and eccentric lowering by patient x 10 reps; seated abduction AROM with 2# into available range - AAROM by PT into further range - static hold and eccentric lowering by patient x 10 reps;      Shoulder Exercises: Prone   Retraction Strengthening;Right;Weights;20 reps   Retraction Weight (lbs) 3   Retraction Limitations prone row with scap squeeze    Extension Strengthening;Right;Weights;20 reps   Extension Weight (lbs) 3   Extension Limitations with scap squeeze - I's (not past hip level)   Horizontal ABduction 1 Strengthening;Right;Weights;20 reps   Horizontal ABduction 1 Weight (lbs) 2   Horizontal ABduction 1 Limitations with scap squeeze - T's   Horizontal ABduction 2 Strengthening;Right;15 reps;Weights   Horizontal ABduction 2 Weight (lbs) 1   Horizontal ABduction 2 Limitations with scap squeeze - Y's with reduced motion     Shoulder Exercises: Standing   External Rotation Right;15 reps;Theraband   Theraband Level (Shoulder External Rotation) Level 2 (Red)   External Rotation Limitations walkouts   Internal Rotation Right;15 reps;Theraband   Theraband Level (Shoulder Internal Rotation) Level 2 (Red)   Internal Rotation Limitations walkouts   Flexion Both;15 reps;Weights   Shoulder Flexion Weight (lbs) 2   Flexion Limitations back against 1/2 FR   ABduction Both;15 reps;Weights   Shoulder ABduction Weight (lbs) 2   ABduction Limitations back against 1/2 FR   Other Standing Exercises cabinet lifts - 2# - flexion x 15; abduction x 15     Shoulder Exercises: Pulleys   Flexion 3 minutes   ABduction 3 minutes     Modalities   Modalities Electrical Stimulation;Moist Heat;Vasopneumatic     Moist Heat Therapy   Number Minutes Moist Heat 15 Minutes   Moist Heat Location Shoulder  prior to treatment     Electrical Stimulation   Electrical Stimulation Location R UT, R anterior shoulder, R lateral borer of scapula, and R deltoid area   Electrical Stimulation Action IFC   Electrical Stimulation Parameters to tolerance   Electrical Stimulation Goals Pain     Vasopneumatic   Number Minutes Vasopneumatic  15 minutes   Vasopnuematic Location  Shoulder   Vasopneumatic Pressure Medium   Vasopneumatic Temperature  coldest temp                  PT Short Term Goals - 05/11/16 1239      PT SHORT TERM GOAL #1    Title patient to be independent with HEP (04/14/16)   Status Achieved     PT SHORT TERM GOAL #2   Title Patient to improve R shoulder PROM flexion to 120 and ER to 50 (in scapular plane) (04/14/16)   Status Achieved           PT Long Term Goals - 05/25/16 1522      PT LONG TERM GOAL #1   Title patient to be independent with advanced HEP (07/09/16)   Status On-going     PT LONG TERM GOAL #2   Title Patient to improve AROM of R shoulder to 140 deg flexion in sitting (07/09/16)  Status Revised     PT LONG TERM GOAL #3   Title Patient to demonstrate proper postural alignment with ability to self correct (05/12/16)   Status Achieved     PT LONG TERM GOAL #4   Title Patient to demonstrate ability to reach cabinet of shoulder height with minimal compensation (07/09/16)   Status On-going     PT LONG TERM GOAL #5   Title Patient to report improved functional use of R UE (bathing, hair care, dressing) with minimal compensations (07/09/16)   Status New               Plan - 06/17/16 1206    Clinical Impression Statement Patient today with continued subjective reports of R shoulder and elbow pain. PT session focusing primarily on strengthening R UE - with patient demonstrating ability to reach to back of head with shoulder abducted. Continued tightness and weakness limting further motion at this time.    PT Treatment/Interventions ADLs/Self Care Home Management;Cryotherapy;Electrical Stimulation;Moist Heat;Ultrasound;Therapeutic exercise;Therapeutic activities;Functional mobility training;Patient/family education;Manual techniques;Passive range of motion;Vasopneumatic Device;Taping;Dry needling;Iontophoresis 4mg /ml Dexamethasone   PT Next Visit Plan progress AROM/strength of R shoulder; modalities prn for pain; manual stretching   Consulted and Agree with Plan of Care Patient      Patient will benefit from skilled therapeutic intervention in order to improve the following deficits and  impairments:  Decreased activity tolerance, Decreased range of motion, Decreased strength, Impaired UE functional use, Pain  Visit Diagnosis: Right shoulder pain, unspecified chronicity  Stiffness of right shoulder, not elsewhere classified  Abnormal posture  Muscle weakness (generalized)     Problem List Patient Active Problem List   Diagnosis Date Noted  . 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 Preop cardiovascular exam 07/09/2015  . Chest pain 05/09/2013  . Hypertension 02/23/2012  . Hyperlipidemia 02/23/2012  . Right shoulder pain 01/25/2012  . Left wrist pain 01/16/2012  . Neck pain 12/26/2011    Kipp LaurenceStephanie R Aaron, PT, DPT 06/17/16 12:50 PM   [facility] [place] [address]  [address] [place], [place], [address] Phone: [phone]   Fax:  [phone]  Name: Wellington HampshireLynn C Jahr MRN: [id number] Date of Birth: [birth year]

## 2016-06-22 ENCOUNTER — Ambulatory Visit: Payer: Medicare Other

## 2016-06-24 ENCOUNTER — Encounter: Payer: Medicare Other | Admitting: Physical Therapy

## 2016-06-28 ENCOUNTER — Ambulatory Visit: Payer: Medicare Other

## 2016-06-28 DIAGNOSIS — M25511 Pain in right shoulder: Secondary | ICD-10-CM | POA: Diagnosis not present

## 2016-06-28 DIAGNOSIS — R293 Abnormal posture: Secondary | ICD-10-CM

## 2016-06-28 DIAGNOSIS — M6281 Muscle weakness (generalized): Secondary | ICD-10-CM

## 2016-06-28 DIAGNOSIS — M25611 Stiffness of right shoulder, not elsewhere classified: Secondary | ICD-10-CM

## 2016-06-28 NOTE — Therapy (Signed)
[facility] [address]  [place], [place], [address] Phone: [phone]   Fax:  [phone]  Physical Therapy Treatment  Patient Details  Name: [name] [name] MRN: [id number] Date of Birth: February 03, 1946 Referring Provider: Dr. Kathryne Hitch  Encounter Date: 06/28/2016      PT End of Session - 06/28/16 1111    Visit Number 25   Number of Visits 28   Date for PT Re-Evaluation 07/09/16   PT Start Time 1103  8 min heat prior to treatment   PT Stop Time 1212   PT Time Calculation (min) 69 min   Activity Tolerance Patient tolerated treatment well   Behavior During Therapy [facility] for tasks assessed/performed      Past Medical History:  Diagnosis Date  . Allergy   . Anxiety   . Arthritis   . Asthma    Bronchial   . Celiac disease   . Full dentures   . Headache    migraines  . History of hiatal hernia   . Hyperlipidemia   . Hypertension   . IBS (irritable bowel syndrome)   . Pneumonia    "years ago"  . PONV (postoperative nausea and vomiting)   . Wears glasses    readers    Past Surgical History:  Procedure Laterality Date  . ABDOMINAL HYSTERECTOMY    . APPENDECTOMY    . BREAST LUMPECTOMY  1985   lt  . CARPAL TUNNEL RELEASE     rt  . CHOLECYSTECTOMY    . ELBOW SURGERY    . EYE SURGERY     both cataracts  . KNEE SURGERY     lt  . NASAL SEPTUM SURGERY    . REVERSE SHOULDER ARTHROPLASTY Right 02/18/2016   Procedure: RIGHT REVERSE TOTAL SHOULDER ARTHROPLASTY;  Surgeon: Ninetta Lights, MD;  Location: [place];  Service: Orthopedics;  Laterality: Right;  . REVERSE TOTAL SHOULDER ARTHROPLASTY Right 02/18/2016  . SHOULDER ACROMIOPLASTY Right 05/09/2014   Procedure: SHOULDER ACROMIOPLASTY;  Surgeon: Ninetta Lights, MD;  Location: [place];  Service: Orthopedics;  Laterality: Right;  . SHOULDER ARTHROSCOPY WITH BICEPSTENOTOMY Right 05/09/2014   Procedure: SHOULDER ARTHROSCOPY WITH  BICEPSTENOTOMY;  Surgeon: Ninetta Lights, MD;  Location: [place];  Service: Orthopedics;  Laterality: Right;  Debridement Rotator Cuff Tear  . SHOULDER ARTHROSCOPY WITH ROTATOR CUFF REPAIR Right 12/19/2014   Procedure: RIGHT SHOULDER ARTHROSCOPY WITH ROTATOR CUFF REPAIR,CALCIFIC DEBRIDEMENT EXTENSIVE;  Surgeon: Ninetta Lights, MD;  Location: [place];  Service: Orthopedics;  Laterality: Right;  . SHOULDER ARTHROSCOPY WITH ROTATOR CUFF REPAIR Right 07/10/2015   Procedure: RIGHT SHOULDER ARTHROSCOPY, DEBRIDEMENT  WITH ROTATOR CUFF REPAIR;  Surgeon: Ninetta Lights, MD;  Location: [place];  Service: Orthopedics;  Laterality: Right;  . SHOULDER SURGERY     left  . TONSILLECTOMY AND ADENOIDECTOMY    . VARICOSE VEIN SURGERY    . WRIST ARTHROSCOPY Right 09/20/2013   Procedure: RIGHT ARTHROSCOPY WRIST EXCISION PISIFORM;  Surgeon: Cammie Sickle, MD;  Location: [place];  Service: Orthopedics;  Laterality: Right;    There were no vitals filed for this visit.      Subjective Assessment - 06/28/16 1108    Subjective Pt. reporting continued R elbow and shoulder pain at same level.  still pain with pressure to elbow.     Patient Stated Goals to improve shoulder function   Currently in Pain?  Yes   Pain Score 4    Pain Location Shoulder   Pain Orientation Right   Pain Descriptors / Indicators Tightness;Constant   Pain Type Acute pain   Pain Onset More than a month ago   Pain Frequency Constant   Aggravating Factors  Dressing   Pain Relieving Factors rest   Multiple Pain Sites No            [facility] PT Assessment - 06/28/16 1127      AROM   Right Shoulder Flexion 130 Degrees  scapular plane; can't perform without compensation   Right Shoulder ABduction 113 Degrees  with lateral trunk lean; can't perform without compensation                      [facility] Adult PT Treatment/Exercise - 06/28/16 1116       Shoulder Exercises: Prone   Retraction Strengthening;Right;Weights;10 reps   Retraction Weight (lbs) 4   Retraction Limitations prone row with scap squeeze   Extension Strengthening;Right;Weights;10 reps   Extension Weight (lbs) 4   Extension Limitations with scap squeeze - I's (not past hip level)   Horizontal ABduction 1 Strengthening;Right;Weights;20 reps   Horizontal ABduction 1 Weight (lbs) 2   Horizontal ABduction 2 Strengthening;Right;Weights;20 reps   Horizontal ABduction 2 Limitations with scap squeeze - Y's with reduced motion     Shoulder Exercises: Standing   External Rotation Right;15 reps;Theraband   Theraband Level (Shoulder External Rotation) Level 2 (Red)   External Rotation Limitations walkouts   Internal Rotation Right;15 reps;Theraband   Theraband Level (Shoulder Internal Rotation) Level 2 (Red)   Internal Rotation Limitations walkouts   Flexion 15 reps;Weights;Right   Shoulder Flexion Weight (lbs) 2   ABduction Both;15 reps;Weights   Shoulder ABduction Weight (lbs) 2   ABduction Limitations back against 1/2 FR   Other Standing Exercises cabinet lifts - 2# - flexion x 15; abduction x 15     Shoulder Exercises: Pulleys   Flexion 3 minutes   ABduction 3 minutes     Modalities   Modalities Electrical Stimulation;Moist Heat;Vasopneumatic     Moist Heat Therapy   Number Minutes Moist Heat 8 Minutes   Moist Heat Location Shoulder  8 min moist heat prior to treatment     Electrical Stimulation   Electrical Stimulation Location R UT, R anterior shoulder, R lateral borer of scapula, and R deltoid area   Electrical Stimulation Action IFC    Electrical Stimulation Parameters to tolerance    Electrical Stimulation Goals Pain     Vasopneumatic   Number Minutes Vasopneumatic  15 minutes   Vasopnuematic Location  Shoulder   Vasopneumatic Pressure Medium   Vasopneumatic Temperature  coldest temp                  PT Short Term Goals - 05/11/16 1239       PT SHORT TERM GOAL #1   Title patient to be independent with HEP (04/14/16)   Status Achieved     PT SHORT TERM GOAL #2   Title Patient to improve R shoulder PROM flexion to 120 and ER to 50 (in scapular plane) (04/14/16)   Status Achieved           PT Long Term Goals - 06/28/16 1112      PT LONG TERM GOAL #1   Title patient to be independent with advanced HEP (07/09/16)   Status Partially Met  3.19.18: met for current  PT LONG TERM GOAL #2   Title Patient to improve AROM of R shoulder to 140 deg flexion in sitting (07/09/16)   Status On-going  3.19.18: AROM R shoulder 130 dg flexion in scapular plane     PT LONG TERM GOAL #3   Title Patient to demonstrate proper postural alignment with ability to self correct (05/12/16)   Status Achieved     PT LONG TERM GOAL #4   Title Patient to demonstrate ability to reach cabinet of shoulder height with minimal compensation (07/09/16)   Status Achieved     PT LONG TERM GOAL #5   Title Patient to report improved functional use of R UE (bathing, hair care, dressing) with minimal compensations (07/09/16)   Status On-going               Plan - 06/28/16 1113    Clinical Impression Statement Pt. able to perform reaching to cabinet at shoulder height with minimal compensation today.  R shoulder AROM 130 dg flexion improving 6 dg since last measured on 2.19.   Significant compensation still required for this.  R shoulder abduction nearly unchanged since 2.19 at 113 dg today with compensation still required.  Pt. still limited in hair care and dressing however able to reach back of head with compensation.  Today's treatment with mild progression in scapular/shoulder strengthening activities to continue maximizing AROM.  Pt. tolerating therex well however R elbow pain remains pt.'s chief complaint with movement.  Pt. will continue to benefit from further skilled therapy to maximize functional strength.     PT Treatment/Interventions ADLs/Self  Care Home Management;Cryotherapy;Electrical Stimulation;Moist Heat;Ultrasound;Therapeutic exercise;Therapeutic activities;Functional mobility training;Patient/family education;Manual techniques;Passive range of motion;Vasopneumatic Device;Taping;Dry needling;Iontophoresis '4mg'$ /ml Dexamethasone   PT Next Visit Plan progress AROM/strength of R shoulder; modalities prn for pain; manual stretching      Patient will benefit from skilled therapeutic intervention in order to improve the following deficits and impairments:  Decreased activity tolerance, Decreased range of motion, Decreased strength, Impaired UE functional use, Pain  Visit Diagnosis: Right shoulder pain, unspecified chronicity  Stiffness of right shoulder, not elsewhere classified  Abnormal posture  Muscle weakness (generalized)     Problem List Patient Active Problem List   Diagnosis Date Noted  . 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 Preop cardiovascular exam 07/09/2015  . Chest pain 05/09/2013  . Hypertension 02/23/2012  . Hyperlipidemia 02/23/2012  . Right shoulder pain 01/25/2012  . Left wrist pain 01/16/2012  . Neck pain 12/26/2011    Bess Harvest, PTA 06/28/16 12:24 PM  [facility] [address]  [place], [place], [address] Phone: [phone]   Fax:  [phone]  Name: ATALAYA ZAPPIA MRN: [id number] Date of Birth: May 20, 1945

## 2016-06-29 ENCOUNTER — Ambulatory Visit: Payer: Medicare Other

## 2016-07-01 ENCOUNTER — Ambulatory Visit: Payer: Medicare Other | Admitting: Physical Therapy

## 2016-07-01 DIAGNOSIS — M25511 Pain in right shoulder: Secondary | ICD-10-CM | POA: Diagnosis not present

## 2016-07-01 DIAGNOSIS — M25611 Stiffness of right shoulder, not elsewhere classified: Secondary | ICD-10-CM

## 2016-07-01 DIAGNOSIS — M6281 Muscle weakness (generalized): Secondary | ICD-10-CM

## 2016-07-01 DIAGNOSIS — R293 Abnormal posture: Secondary | ICD-10-CM

## 2016-07-01 NOTE — Therapy (Signed)
[facility] [address]  [place], [place], [address] Phone: [phone]   Fax:  [phone]  Physical Therapy Treatment  Patient Details  Name: [name] [name] MRN: [id number] Date of Birth: Sep 04, 1945 Referring Provider: Dr. Kathryne Hitch  Encounter Date: 07/01/2016      PT End of Session - 07/01/16 1636    Visit Number 26   Number of Visits 28   Date for PT Re-Evaluation 07/09/16   PT Start Time 1105   PT Stop Time 1151   PT Time Calculation (min) 46 min   Activity Tolerance Patient tolerated treatment well   Behavior During Therapy [facility] for tasks assessed/performed      Past Medical History:  Diagnosis Date  . Allergy   . Anxiety   . Arthritis   . Asthma    Bronchial   . Celiac disease   . Full dentures   . Headache    migraines  . History of hiatal hernia   . Hyperlipidemia   . Hypertension   . IBS (irritable bowel syndrome)   . Pneumonia    "years ago"  . PONV (postoperative nausea and vomiting)   . Wears glasses    readers    Past Surgical History:  Procedure Laterality Date  . ABDOMINAL HYSTERECTOMY    . APPENDECTOMY    . BREAST LUMPECTOMY  1985   lt  . CARPAL TUNNEL RELEASE     rt  . CHOLECYSTECTOMY    . ELBOW SURGERY    . EYE SURGERY     both cataracts  . KNEE SURGERY     lt  . NASAL SEPTUM SURGERY    . REVERSE SHOULDER ARTHROPLASTY Right 02/18/2016   Procedure: RIGHT REVERSE TOTAL SHOULDER ARTHROPLASTY;  Surgeon: Ninetta Lights, MD;  Location: [place];  Service: Orthopedics;  Laterality: Right;  . REVERSE TOTAL SHOULDER ARTHROPLASTY Right 02/18/2016  . SHOULDER ACROMIOPLASTY Right 05/09/2014   Procedure: SHOULDER ACROMIOPLASTY;  Surgeon: Ninetta Lights, MD;  Location: [place];  Service: Orthopedics;  Laterality: Right;  . SHOULDER ARTHROSCOPY WITH BICEPSTENOTOMY Right 05/09/2014   Procedure: SHOULDER ARTHROSCOPY WITH BICEPSTENOTOMY;  Surgeon: Ninetta Lights, MD;   Location: [place];  Service: Orthopedics;  Laterality: Right;  Debridement Rotator Cuff Tear  . SHOULDER ARTHROSCOPY WITH ROTATOR CUFF REPAIR Right 12/19/2014   Procedure: RIGHT SHOULDER ARTHROSCOPY WITH ROTATOR CUFF REPAIR,CALCIFIC DEBRIDEMENT EXTENSIVE;  Surgeon: Ninetta Lights, MD;  Location: [place];  Service: Orthopedics;  Laterality: Right;  . SHOULDER ARTHROSCOPY WITH ROTATOR CUFF REPAIR Right 07/10/2015   Procedure: RIGHT SHOULDER ARTHROSCOPY, DEBRIDEMENT  WITH ROTATOR CUFF REPAIR;  Surgeon: Ninetta Lights, MD;  Location: [place];  Service: Orthopedics;  Laterality: Right;  . SHOULDER SURGERY     left  . TONSILLECTOMY AND ADENOIDECTOMY    . VARICOSE VEIN SURGERY    . WRIST ARTHROSCOPY Right 09/20/2013   Procedure: RIGHT ARTHROSCOPY WRIST EXCISION PISIFORM;  Surgeon: Cammie Sickle, MD;  Location: [place];  Service: Orthopedics;  Laterality: Right;    There were no vitals filed for this visit.      Subjective Assessment - 07/01/16 1635    Subjective Patient seen by MD this week - wants to incorporate elbow into plan of care as this continues to be problematic for her   Patient Stated Goals to improve shoulder function   Currently in Pain? Yes   Pain  Score 5    Pain Location Shoulder   Pain Orientation Right   Pain Descriptors / Indicators Aching;Constant;Sore   Pain Type Acute pain   Multiple Pain Sites Yes   Pain Score 8   Pain Location Elbow   Pain Orientation Right   Pain Descriptors / Indicators Aching;Constant;Sharp   Pain Type Acute pain                         [facility] Adult PT Treatment/Exercise - 07/01/16 0001      Exercises   Exercises Wrist     Shoulder Exercises: Seated   Flexion Strengthening;Right;15 reps;Weights   Flexion Weight (lbs) 3   Abduction Strengthening;Right;15 reps;Weights   ABduction Weight (lbs) 3     Shoulder Exercises: Standing   Flexion Right;15  reps;Weights   Shoulder Flexion Weight (lbs) 2   Flexion Limitations AA going up wall - eccentric control coming down   ABduction Right;15 reps;Weights   Shoulder ABduction Weight (lbs) 2   ABduction Limitations AA going up wall - eccentric control coming down   Other Standing Exercises wall push-up x 15 encouraging weight bearing and full elbow extension x 15 reps     Shoulder Exercises: Pulleys   Flexion 3 minutes   ABduction 3 minutes     Wrist Exercises   Other wrist exercises flexion and extension stretch - each 3 x 30 seconds with elbow extended     Modalities   Modalities Cryotherapy     Cryotherapy   Number Minutes Cryotherapy 8 Minutes   Cryotherapy Location --  R medial elbow   Type of Cryotherapy Ice massage     Manual Therapy   Manual Therapy Joint mobilization;Soft tissue mobilization;Passive ROM   Manual therapy comments patient supine   Joint Mobilization proximal and distal radial/ulnar glides, anterior glide/ulnar distraction   Soft tissue mobilization STM to R medial elbow along with R forearm   Passive ROM ROM into full elbow flexion/extension with overpressure, as well as full supination/pronation with slight overpressure                  PT Short Term Goals - 05/11/16 1239      PT SHORT TERM GOAL #1   Title patient to be independent with HEP (04/14/16)   Status Achieved     PT SHORT TERM GOAL #2   Title Patient to improve R shoulder PROM flexion to 120 and ER to 50 (in scapular plane) (04/14/16)   Status Achieved           PT Long Term Goals - 06/28/16 1112      PT LONG TERM GOAL #1   Title patient to be independent with advanced HEP (07/09/16)   Status Partially Met  3.19.18: met for current      PT LONG TERM GOAL #2   Title Patient to improve AROM of R shoulder to 140 deg flexion in sitting (07/09/16)   Status On-going  3.19.18: AROM R shoulder 130 dg flexion in scapular plane     PT LONG TERM GOAL #3   Title Patient to  demonstrate proper postural alignment with ability to self correct (05/12/16)   Status Achieved     PT LONG TERM GOAL #4   Title Patient to demonstrate ability to reach cabinet of shoulder height with minimal compensation (07/09/16)   Status Achieved     PT LONG TERM GOAL #5   Title Patient to report improved functional use  of R UE (bathing, hair care, dressing) with minimal compensations (07/09/16)   Status On-going               Plan - 07/01/16 1637    Clinical Impression Statement Jeani Hawking recently seeing MD - wants patient to continue PT with addition of elbow into treatment as this area continues to be very painful with all movements at both the elbow and shoulder. Patient making slow progress with R shoulder ROM and strengthening, however is improveing function of shoulder - can reach behind head and at/slightly above shoulder level. Joint mobs as well as ice massage and stretching at elbow joint with some pain reduction following this. Careful of mobs done at elbow to also not provide distraction at shoulder. Will continue to benefit from PT to maximize functional use of shoulder as well as improve pain and function at elbow.    PT Treatment/Interventions ADLs/Self Care Home Management;Cryotherapy;Electrical Stimulation;Moist Heat;Ultrasound;Therapeutic exercise;Therapeutic activities;Functional mobility training;Patient/family education;Manual techniques;Passive range of motion;Vasopneumatic Device;Taping;Dry needling;Iontophoresis '4mg'$ /ml Dexamethasone   PT Next Visit Plan progress AROM/strength of R shoulder; modalities prn for pain; manual stretching; include elbow: mobilizations, manual, stretching, and strengthening   Consulted and Agree with Plan of Care Patient      Patient will benefit from skilled therapeutic intervention in order to improve the following deficits and impairments:  Decreased activity tolerance, Decreased range of motion, Decreased strength, Impaired UE functional  use, Pain  Visit Diagnosis: Right shoulder pain, unspecified chronicity  Stiffness of right shoulder, not elsewhere classified  Abnormal posture  Muscle weakness (generalized)     Problem List Patient Active Problem List   Diagnosis Date Noted  . 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 Preop cardiovascular exam 07/09/2015  . Chest pain 05/09/2013  . Hypertension 02/23/2012  . Hyperlipidemia 02/23/2012  . Right shoulder pain 01/25/2012  . Left wrist pain 01/16/2012  . Neck pain 12/26/2011     Lanney Gins, PT, DPT 07/01/16 6:03 PM   [facility] [address]  [place], [place], [address] Phone: [phone]   Fax:  [phone]  Name: REN GRASSE MRN: [id number] Date of Birth: [birth year]

## 2016-07-06 ENCOUNTER — Ambulatory Visit: Payer: Medicare Other

## 2016-07-06 DIAGNOSIS — M25511 Pain in right shoulder: Secondary | ICD-10-CM | POA: Diagnosis not present

## 2016-07-06 DIAGNOSIS — M25611 Stiffness of right shoulder, not elsewhere classified: Secondary | ICD-10-CM

## 2016-07-06 DIAGNOSIS — M6281 Muscle weakness (generalized): Secondary | ICD-10-CM

## 2016-07-06 DIAGNOSIS — R293 Abnormal posture: Secondary | ICD-10-CM

## 2016-07-06 NOTE — Therapy (Signed)
Dictation #1 [id number]  [place], [place], [address] Phone: [phone]   Fax:  [phone]  Physical Therapy Treatment  Patient Details  Name: [name] [name] MRN: [id number] Date of Birth: 10-21-1945 Referring Provider: Dr. Kathryne Hitch  Encounter Date: 07/06/2016      PT End of Session - 07/06/16 1107    Visit Number 27   Number of Visits 28   Date for PT Re-Evaluation 07/09/16   PT Start Time 1102  8 min moist heat prior to tx   PT Stop Time 1149   PT Time Calculation (min) 47 min   Activity Tolerance Patient tolerated treatment well   Behavior During Therapy [facility] for tasks assessed/performed      Past Medical History:  Diagnosis Date  . Allergy   . Anxiety   . Arthritis   . Asthma    Bronchial   . Celiac disease   . Full dentures   . Headache    migraines  . History of hiatal hernia   . Hyperlipidemia   . Hypertension   . IBS (irritable bowel syndrome)   . Pneumonia    "years ago"  . PONV (postoperative nausea and vomiting)   . Wears glasses    readers    Past Surgical History:  Procedure Laterality Date  . ABDOMINAL HYSTERECTOMY    . APPENDECTOMY    . BREAST LUMPECTOMY  1985   lt  . CARPAL TUNNEL RELEASE     rt  . CHOLECYSTECTOMY    . ELBOW SURGERY    . EYE SURGERY     both cataracts  . KNEE SURGERY     lt  . NASAL SEPTUM SURGERY    . REVERSE SHOULDER ARTHROPLASTY Right 02/18/2016   Procedure: RIGHT REVERSE TOTAL SHOULDER ARTHROPLASTY;  Surgeon: Ninetta Lights, MD;  Location: [place];  Service: Orthopedics;  Laterality: Right;  . REVERSE TOTAL SHOULDER ARTHROPLASTY Right 02/18/2016  . SHOULDER ACROMIOPLASTY Right 05/09/2014   Procedure: SHOULDER ACROMIOPLASTY;  Surgeon: Ninetta Lights, MD;  Location: [place];  Service: Orthopedics;  Laterality: Right;  . SHOULDER ARTHROSCOPY WITH BICEPSTENOTOMY Right 05/09/2014   Procedure: SHOULDER ARTHROSCOPY WITH BICEPSTENOTOMY;  Surgeon: Ninetta Lights, MD;  Location: [place];  Service: Orthopedics;  Laterality: Right;  Debridement Rotator Cuff Tear  . SHOULDER ARTHROSCOPY WITH ROTATOR CUFF REPAIR Right 12/19/2014   Procedure: RIGHT SHOULDER ARTHROSCOPY WITH ROTATOR CUFF REPAIR,CALCIFIC DEBRIDEMENT EXTENSIVE;  Surgeon: Ninetta Lights, MD;  Location: [place];  Service: Orthopedics;  Laterality: Right;  . SHOULDER ARTHROSCOPY WITH ROTATOR CUFF REPAIR Right 07/10/2015   Procedure: RIGHT SHOULDER ARTHROSCOPY, DEBRIDEMENT  WITH ROTATOR CUFF REPAIR;  Surgeon: Ninetta Lights, MD;  Location: [place];  Service: Orthopedics;  Laterality: Right;  . SHOULDER SURGERY     left  . TONSILLECTOMY AND ADENOIDECTOMY    . VARICOSE VEIN SURGERY    . WRIST ARTHROSCOPY Right 09/20/2013   Procedure: RIGHT ARTHROSCOPY WRIST EXCISION PISIFORM;  Surgeon: Cammie Sickle, MD;  Location: [place];  Service: Orthopedics;  Laterality: Right;    There were no vitals filed for this visit.      Subjective Assessment - 07/06/16 1103    Subjective Pt. reporting elbow pain is somewhat worse today she thinks due to weather.     Patient Stated Goals to improve shoulder function   Currently in Pain?  Yes   Pain Score 5    Pain Location Shoulder   Pain Orientation Right   Pain Descriptors / Indicators Aching;Constant;Sore   Pain Type Acute pain   Pain Onset More than a month ago   Aggravating Factors  Reaching, dressin    Pain Relieving Factors rest    Multiple Pain Sites Yes   Pain Score 8   Pain Location Elbow   Pain Orientation Right   Pain Descriptors / Indicators Aching;Constant;Sharp   Pain Type Acute pain            [facility] PT Assessment - 07/06/16 1108      Assessment   Next MD Visit 08/10/16                     [facility] Adult PT Treatment/Exercise - 07/06/16 1121      Elbow Exercises   Elbow  Flexion AROM;15 reps;Left;Strengthening  neutral; 3#; elbow extension with green TB x 20 reps      Shoulder Exercises: Seated   Flexion Strengthening;Right;Weights;20 reps   Flexion Weight (lbs) 3   Flexion Limitations compensation    Abduction Strengthening;Right;Weights;20 reps   ABduction Weight (lbs) 3   ABduction Limitations compensation     Shoulder Exercises: Standing   Other Standing Exercises 3# orange p-ball (55cm) wall roll flexion, abduction x 15 reps each; gentle stretch      Shoulder Exercises: Pulleys   Flexion 3 minutes   ABduction 3 minutes     Wrist Exercises   Other wrist exercises flexion and extension stretch - each 3 x 30 seconds with elbow extended  with therapist      Moist Heat Therapy   Number Minutes Moist Heat 8 Minutes   Moist Heat Location Shoulder  R shoulder 8 min prior to treatment     Cryotherapy   Number Minutes Cryotherapy 8 Minutes   Cryotherapy Location --  R medial elbow    Type of Cryotherapy Ice massage     Manual Therapy   Manual therapy comments patient supine   Soft tissue mobilization STM to R medial elbow along with R forearm   Passive ROM ROM into full elbow flexion/extension with overpressure, as well as full supination/pronation with slight overpressure                  PT Short Term Goals - 05/11/16 1239      PT SHORT TERM GOAL #1   Title patient to be independent with HEP (04/14/16)   Status Achieved     PT SHORT TERM GOAL #2   Title Patient to improve R shoulder PROM flexion to 120 and ER to 50 (in scapular plane) (04/14/16)   Status Achieved           PT Long Term Goals - 06/28/16 1112      PT LONG TERM GOAL #1   Title patient to be independent with advanced HEP (07/09/16)   Status Partially Met  3.19.18: met for current      PT LONG TERM GOAL #2   Title Patient to improve AROM of R shoulder to 140 deg flexion in sitting (07/09/16)   Status On-going  3.19.18: AROM R shoulder 130 dg flexion in  scapular plane     PT LONG TERM GOAL #3   Title Patient to demonstrate proper postural alignment with ability to self correct (05/12/16)   Status Achieved     PT LONG TERM GOAL #4   Title Patient to  demonstrate ability to reach cabinet of shoulder height with minimal compensation (07/09/16)   Status Achieved     PT LONG TERM GOAL #5   Title Patient to report improved functional use of R UE (bathing, hair care, dressing) with minimal compensations (07/09/16)   Status On-going               Plan - 07/06/16 1108    Clinical Impression Statement Pt. with complaint of increased R elbow pain today, which she attributes to weather change.  ROM and strengthening for R shoulder continued today to pt. tolerance with treatment focusing on R elbow stretching and manual massage.  Gentle elbow strengthening activity added today and tolerated well.  Treatment ending with ice massage to medial elbow to decrease post-exercise swelling and pain.  Pain relief following this.  Pt. with continue to benefit from further skilled therapy to increase R shoulder and elbow functional strength, ROM, and decrease pain with functional activities.   PT Treatment/Interventions ADLs/Self Care Home Management;Cryotherapy;Electrical Stimulation;Moist Heat;Ultrasound;Therapeutic exercise;Therapeutic activities;Functional mobility training;Patient/family education;Manual techniques;Passive range of motion;Vasopneumatic Device;Taping;Dry needling;Iontophoresis 69m/ml Dexamethasone   PT Next Visit Plan progress AROM/strength of R shoulder; modalities prn for pain; manual stretching; include elbow: mobilizations, manual, stretching, and strengthening      Patient will benefit from skilled therapeutic intervention in order to improve the following deficits and impairments:  Decreased activity tolerance, Decreased range of motion, Decreased strength, Impaired UE functional use, Pain  Visit Diagnosis: Right shoulder pain,  unspecified chronicity  Stiffness of right shoulder, not elsewhere classified  Abnormal posture  Muscle weakness (generalized)     Problem List Patient Active Problem List   Diagnosis Date Noted  . 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 Preop cardiovascular exam 07/09/2015  . Chest pain 05/09/2013  . Hypertension 02/23/2012  . Hyperlipidemia 02/23/2012  . Right shoulder pain 01/25/2012  . Left wrist pain 01/16/2012  . Neck pain 12/26/2011    MBess Harvest PTA 07/06/16 12:09 PM   [place] [address] [place] [address]Phone: [phone]  Fax:  [phone] Name: [name] CASSELLSMRN: [id number] of Birth: 307-23-1947

## 2016-07-08 ENCOUNTER — Ambulatory Visit: Payer: Medicare Other | Admitting: Physical Therapy

## 2016-07-08 DIAGNOSIS — M25611 Stiffness of right shoulder, not elsewhere classified: Secondary | ICD-10-CM

## 2016-07-08 DIAGNOSIS — R293 Abnormal posture: Secondary | ICD-10-CM

## 2016-07-08 DIAGNOSIS — M25511 Pain in right shoulder: Secondary | ICD-10-CM

## 2016-07-08 DIAGNOSIS — M6281 Muscle weakness (generalized): Secondary | ICD-10-CM

## 2016-07-08 NOTE — Therapy (Signed)
[facility] [place] [address]  [address] [place], [place], [address] Phone: [phone]   Fax:  [phone]  Physical Therapy Treatment  Patient Details  Name: [name] HampshireLynn C [name] MRN: [id number] Date of Birth: [birth year] Referring Provider: Dr. [name]  Encounter Date: 07/08/2016      PT End of Session - 07/08/16 1103    Visit Number 28   Number of Visits 36   Date for PT Re-Evaluation 09/02/16   PT Start Time 1103   PT Stop Time 1146   PT Time Calculation (min) 43 min   Activity Tolerance Patient tolerated treatment well   Behavior During Therapy [facility] for tasks assessed/performed      Past Medical History:  Diagnosis Date  . Allergy   . Anxiety   . Arthritis   . Asthma    Bronchial   . Celiac disease   . Full dentures   . Headache    migraines  . History of hiatal hernia   . Hyperlipidemia   . Hypertension   . IBS (irritable bowel syndrome)   . Pneumonia    "years ago"  . PONV (postoperative nausea and vomiting)   . Wears glasses    readers    Past Surgical History:  Procedure Laterality Date  . ABDOMINAL HYSTERECTOMY    . APPENDECTOMY    . BREAST LUMPECTOMY  1985   lt  . CARPAL TUNNEL RELEASE     rt  . CHOLECYSTECTOMY    . ELBOW SURGERY    . EYE SURGERY     both cataracts  . KNEE SURGERY     lt  . NASAL SEPTUM SURGERY    . REVERSE SHOULDER ARTHROPLASTY Right 02/18/2016   Procedure: RIGHT REVERSE TOTAL SHOULDER ARTHROPLASTY;  Surgeon: [name], MD;  [facility];  Service: Orthopedics;  Laterality: Right;  . REVERSE TOTAL SHOULDER ARTHROPLASTY Right 02/18/2016  . SHOULDER ACROMIOPLASTY Right 05/09/2014   Procedure: SHOULDER ACROMIOPLASTY;  Surgeon: [name], MD;  Location: [facility];  Service: Orthopedics;  Laterality: Right;  . SHOULDER ARTHROSCOPY WITH BICEPSTENOTOMY Right 05/09/2014   Procedure: SHOULDER ARTHROSCOPY WITH BICEPSTENOTOMY;  Surgeon: [name], MD;   Location: [facility];  Service: Orthopedics;  Laterality: Right;  Debridement Rotator Cuff Tear  . SHOULDER ARTHROSCOPY WITH ROTATOR CUFF REPAIR Right 12/19/2014   Procedure: RIGHT SHOULDER ARTHROSCOPY WITH ROTATOR CUFF REPAIR,CALCIFIC DEBRIDEMENT EXTENSIVE;  Surgeon: [name], MD;  Location: [facility];  Service: Orthopedics;  Laterality: Right;  . SHOULDER ARTHROSCOPY WITH ROTATOR CUFF REPAIR Right 07/10/2015   Procedure: RIGHT SHOULDER ARTHROSCOPY, DEBRIDEMENT  WITH ROTATOR CUFF REPAIR;  Surgeon: [name], MD;  Location: [facility];  Service: Orthopedics;  Laterality: Right;  . SHOULDER SURGERY     left  . TONSILLECTOMY AND ADENOIDECTOMY    . VARICOSE VEIN SURGERY    . WRIST ARTHROSCOPY Right 09/20/2013   Procedure: RIGHT ARTHROSCOPY WRIST EXCISION PISIFORM;  Surgeon: [name], MD;  Location: [facility];  Service: Orthopedics;  Laterality: Right;    There were no vitals filed for this visit.      Subjective Assessment - 07/08/16 1247    Subjective Continued elbow pain - feels like making little/slow progress   Patient Stated Goals to improve shoulder function   Currently in Pain? Yes   Pain Score 6    Pain Location Elbow   Pain Orientation Right  Pain Descriptors / Indicators Aching;Sore   Pain Type Acute pain            [facility] PT Assessment - 07/08/16 1120      AROM   Right Shoulder Flexion 132 Degrees   Right Shoulder ABduction 124 Degrees   Right Shoulder Internal Rotation --  functional to iliac crest/sacrum   Right Shoulder External Rotation --  functional to occiput                     [facility] Adult PT Treatment/Exercise - 07/08/16 0001      Shoulder Exercises: Seated   Flexion Strengthening;Right;Weights;20 reps   Flexion Weight (lbs) 3   Abduction Strengthening;Right;Weights;20 reps   ABduction Weight (lbs) 3   Other Seated Exercises seated flexion AROM with 3# into  available range - AAROM by PT into further range - static hold and eccentric lowering by patient x 15 reps; seated abduction AROM with 3# into available range - AAROM by PT into further range - static hold and eccentric lowering by patient x 15 reps;      Shoulder Exercises: Standing   Flexion Right;15 reps   Flexion Limitations AA going up wall - eccentric control coming down   Extension Strengthening;Both;15 reps;Theraband   Theraband Level (Shoulder Extension) Level 3 (Green)   Extension Limitations VC for scap squeeze   Row Strengthening;Both;15 reps;Theraband   Theraband Level (Shoulder Row) Level 3 (Green)   Row Limitations VC for scap squeeze   Other Standing Exercises wall push-up x 15 reps     Shoulder Exercises: Pulleys   Flexion 3 minutes   ABduction 3 minutes                  PT Short Term Goals - 05/11/16 1239      PT SHORT TERM GOAL #1   Title patient to be independent with HEP (04/14/16)   Status Achieved     PT SHORT TERM GOAL #2   Title Patient to improve R shoulder PROM flexion to 120 and ER to 50 (in scapular plane) (04/14/16)   Status Achieved           PT Long Term Goals - 07/08/16 1249      PT LONG TERM GOAL #1   Title patient to be independent with advanced HEP (07/09/16)   Status Achieved     PT LONG TERM GOAL #2   Title Patient to improve AROM of R shoulder to 140 deg flexion in sitting (09/02/16)   Status On-going     PT LONG TERM GOAL #3   Title Patient to demonstrate proper postural alignment with ability to self correct (05/12/16)   Status Achieved     PT LONG TERM GOAL #4   Title Patient to demonstrate ability to reach cabinet of shoulder height with minimal compensation with up to 3-5# weighted item (09/02/16)   Status Revised     PT LONG TERM GOAL #5   Title Patient to report improved functional use of R UE (bathing, hair care, dressing) with minimal compensations (09/02/16)   Status On-going               Plan -  07/08/16 1103    Clinical Impression Statement Larita Fife today with some improvement in AROM of R shoulder - however, patient reporting as if elbow pain is her biggest limting factor. PT refraining from any elbow mobilizations today as patient reports some swelling after last visit with this. PT and  patient discussing reduced PT frequency to 1x/week as patient currently seems to be plateauing with treatment, with patient demonstrating excellent compliance with HEP, with patient on board to make this change. Will continue to progress functional use of both R shoulder and elbow at subsequent visits.    PT Treatment/Interventions ADLs/Self Care Home Management;Cryotherapy;Electrical Stimulation;Moist Heat;Ultrasound;Therapeutic exercise;Therapeutic activities;Functional mobility training;Patient/family education;Manual techniques;Passive range of motion;Vasopneumatic Device;Taping;Dry needling;Iontophoresis 4mg /ml Dexamethasone   PT Next Visit Plan progress AROM/strength of R shoulder; modalities prn for pain; manual stretching; include elbow: mobilizations, manual, stretching, and strengthening   Consulted and Agree with Plan of Care Patient      Patient will benefit from skilled therapeutic intervention in order to improve the following deficits and impairments:  Decreased activity tolerance, Decreased range of motion, Decreased strength, Impaired UE functional use, Pain  Visit Diagnosis: Right shoulder pain, unspecified chronicity - Plan: PT plan of care cert/re-cert  Stiffness of right shoulder, not elsewhere classified - Plan: PT plan of care cert/re-cert  Abnormal posture - Plan: PT plan of care cert/re-cert  Muscle weakness (generalized) - Plan: PT plan of care cert/re-cert     Problem List Patient Active Problem List   Diagnosis Date Noted  . 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 Preop cardiovascular exam 07/09/2015  . Chest pain 05/09/2013  .  Hypertension 02/23/2012  . Hyperlipidemia 02/23/2012  . Right shoulder pain 01/25/2012  . Left wrist pain 01/16/2012  . Neck pain 12/26/2011     Kipp Laurence, PT, DPT 07/08/16 1:10 PM   [facility] [address]  [address] [place], [place], [address] Phone: [phone]   Fax:  [phone]  Name: SHARNAY CASHION MRN: [id number] Date of Birth: [birth year]

## 2016-07-13 ENCOUNTER — Ambulatory Visit: Payer: Medicare Other | Attending: Orthopedic Surgery | Admitting: Physical Therapy

## 2016-07-13 DIAGNOSIS — M6281 Muscle weakness (generalized): Secondary | ICD-10-CM | POA: Diagnosis present

## 2016-07-13 DIAGNOSIS — M25511 Pain in right shoulder: Secondary | ICD-10-CM | POA: Diagnosis not present

## 2016-07-13 DIAGNOSIS — M25611 Stiffness of right shoulder, not elsewhere classified: Secondary | ICD-10-CM | POA: Diagnosis present

## 2016-07-13 DIAGNOSIS — R293 Abnormal posture: Secondary | ICD-10-CM

## 2016-07-13 NOTE — Therapy (Signed)
[facility] [address]  [address] [place], [place], [address] Phone: [phone]   Fax:  [phone]  Physical Therapy Treatment  Patient Details  Name: [name] [name] MRN: [id number] Date of Birth: 07-21-1945 Referring Provider: Dr. Mckinley Jewel  Encounter Date: 07/13/2016      PT End of Session - 07/13/16 1026    Visit Number 29   Number of Visits 36   Date for PT Re-Evaluation 09/02/16   PT Start Time 1020   PT Stop Time 1101   PT Time Calculation (min) 41 min   Activity Tolerance Patient tolerated treatment well   Behavior During Therapy [facility] for tasks assessed/performed      Past Medical History:  Diagnosis Date  . Allergy   . Anxiety   . Arthritis   . Asthma    Bronchial   . Celiac disease   . Full dentures   . Headache    migraines  . History of hiatal hernia   . Hyperlipidemia   . Hypertension   . IBS (irritable bowel syndrome)   . Pneumonia    "years ago"  . PONV (postoperative nausea and vomiting)   . Wears glasses    readers    Past Surgical History:  Procedure Laterality Date  . ABDOMINAL HYSTERECTOMY    . APPENDECTOMY    . BREAST LUMPECTOMY  1985   lt  . CARPAL TUNNEL RELEASE     rt  . CHOLECYSTECTOMY    . ELBOW SURGERY    . EYE SURGERY     both cataracts  . KNEE SURGERY     lt  . NASAL SEPTUM SURGERY    . REVERSE SHOULDER ARTHROPLASTY Right 02/18/2016   Procedure: RIGHT REVERSE TOTAL SHOULDER ARTHROPLASTY;  Surgeon: Loreta Ave, MD;  Location: [facility] OR;  Service: Orthopedics;  Laterality: Right;  . REVERSE TOTAL SHOULDER ARTHROPLASTY Right 02/18/2016  . SHOULDER ACROMIOPLASTY Right 05/09/2014   Procedure: SHOULDER ACROMIOPLASTY;  Surgeon: Loreta Ave, MD;  Location: [facility];  Service: Orthopedics;  Laterality: Right;  . SHOULDER ARTHROSCOPY WITH BICEPSTENOTOMY Right 05/09/2014   Procedure: SHOULDER ARTHROSCOPY WITH BICEPSTENOTOMY;  Surgeon: Loreta Ave, MD;   Location: [facility];  Service: Orthopedics;  Laterality: Right;  Debridement Rotator Cuff Tear  . SHOULDER ARTHROSCOPY WITH ROTATOR CUFF REPAIR Right 12/19/2014   Procedure: RIGHT SHOULDER ARTHROSCOPY WITH ROTATOR CUFF REPAIR,CALCIFIC DEBRIDEMENT EXTENSIVE;  Surgeon: Loreta Ave, MD;  Location: [facility];  Service: Orthopedics;  Laterality: Right;  . SHOULDER ARTHROSCOPY WITH ROTATOR CUFF REPAIR Right 07/10/2015   Procedure: RIGHT SHOULDER ARTHROSCOPY, DEBRIDEMENT  WITH ROTATOR CUFF REPAIR;  Surgeon: Loreta Ave, MD;  Location: [facility];  Service: Orthopedics;  Laterality: Right;  . SHOULDER SURGERY     left  . TONSILLECTOMY AND ADENOIDECTOMY    . VARICOSE VEIN SURGERY    . WRIST ARTHROSCOPY Right 09/20/2013   Procedure: RIGHT ARTHROSCOPY WRIST EXCISION PISIFORM;  Surgeon: Wyn Forster, MD;  Location: [facility];  Service: Orthopedics;  Laterality: Right;    There were no vitals filed for this visit.      Subjective Assessment - 07/13/16 1025    Subjective Has not heard from MD - elbow continues to have high levels of pain   Patient Stated Goals to improve shoulder function   Currently in Pain? Yes   Pain Score 10-Worst pain ever   Pain Location Elbow  Pain Orientation Right   Pain Descriptors / Indicators Sharp;Aching;Constant   Pain Type Acute pain   Pain Onset More than a month ago   Pain Frequency Constant                         [facility] Adult PT Treatment/Exercise - 07/13/16 1032      Shoulder Exercises: Standing   External Rotation Right;15 reps;Theraband   Theraband Level (Shoulder External Rotation) Level 2 (Red)   External Rotation Limitations 1 set in doorway; 1 set walkouts   Internal Rotation Right;15 reps;Theraband   Theraband Level (Shoulder Internal Rotation) Level 2 (Red)   Flexion Strengthening;Both;15 reps;Weights   Shoulder Flexion Weight (lbs) 3   Flexion Limitations  1/2 foam roll against wall; 1 set with AAROM going up with wall with hold and eccentric lowering x 15 reps   ABduction Strengthening;Both;15 reps;Weights   Shoulder ABduction Weight (lbs) 3   ABduction Limitations 1/2 foam roll aginst wall   Other Standing Exercises 2# at wrist - ball walk ups on wall x 20 reps   Other Standing Exercises wall push-up x 15 reps     Shoulder Exercises: Pulleys   Flexion 3 minutes   ABduction 3 minutes     Shoulder Exercises: ROM/Strengthening   Cybex Row 20 reps   Cybex Row Limitations 5# - with 5" scap squeeze   Other ROM/Strengthening Exercises BATCA pulldown - 5# x 15                   PT Short Term Goals - 05/11/16 1239      PT SHORT TERM GOAL #1   Title patient to be independent with HEP (04/14/16)   Status Achieved     PT SHORT TERM GOAL #2   Title Patient to improve R shoulder PROM flexion to 120 and ER to 50 (in scapular plane) (04/14/16)   Status Achieved           PT Long Term Goals - 07/08/16 1249      PT LONG TERM GOAL #1   Title patient to be independent with advanced HEP (07/09/16)   Status Achieved     PT LONG TERM GOAL #2   Title Patient to improve AROM of R shoulder to 140 deg flexion in sitting (09/02/16)   Status On-going     PT LONG TERM GOAL #3   Title Patient to demonstrate proper postural alignment with ability to self correct (05/12/16)   Status Achieved     PT LONG TERM GOAL #4   Title Patient to demonstrate ability to reach cabinet of shoulder height with minimal compensation with up to 3-5# weighted item (09/02/16)   Status Revised     PT LONG TERM GOAL #5   Title Patient to report improved functional use of R UE (bathing, hair care, dressing) with minimal compensations (09/02/16)   Status On-going               Plan - 07/13/16 1123    Clinical Impression Statement [name] [name] doing well today - PT session focusing on improving overhead strengthening to allow patient to perform more functional  activities with reduced restrictons and compensations with patient continuing to demonstrate general weakness at shoulder level and above. Patient reporting continued elbow pain limting function of daily activities with patient yet to hear from MD or PA regarding current concerns. Will continue to progress functional strength and use of R UE.  PT Treatment/Interventions ADLs/Self Care Home Management;Cryotherapy;Electrical Stimulation;Moist Heat;Ultrasound;Therapeutic exercise;Therapeutic activities;Functional mobility training;Patient/family education;Manual techniques;Passive range of motion;Vasopneumatic Device;Taping;Dry needling;Iontophoresis /ml Dexamethasone   PT Next Visit Plan progress AROM/strength of R shoulder; modalities prn for pain; manual stretching; include elbow: mobilizations, manual, stretching, and strengthening   Consulted and Agree with Plan of Care Patient      Patient will benefit from skilled therapeutic intervention in order to improve the following deficits and impairments:  Decreased activity tolerance, Decreased range of motion, Decreased strength, Impaired UE functional use, Pain  Visit Diagnosis: Right shoulder pain, unspecified chronicity  Stiffness of right shoulder, not elsewhere classified  Abnormal posture  Muscle weakness (generalized)     Problem List Patient Active Problem List   Diagnosis Date Noted  . 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 Preop cardiovascular exam 07/09/2015  . Chest pain 05/09/2013  . Hypertension 02/23/2012  . Hyperlipidemia 02/23/2012  . Right shoulder pain 01/25/2012  . Left wrist pain 01/16/2012  . Neck pain 12/26/2011     Kipp Laurence, PT, DPT 07/13/16 11:26 AM   [facility] [address]  [address] [place], [place], [address] Phone: [phone]   Fax:  [phone]  Name: [name] [name] MRN: [id number] Date of  Birth: 1945/08/17

## 2016-07-15 ENCOUNTER — Ambulatory Visit: Payer: Medicare Other

## 2016-07-20 ENCOUNTER — Ambulatory Visit: Payer: Medicare Other

## 2016-07-22 ENCOUNTER — Ambulatory Visit: Payer: Medicare Other | Admitting: Physical Therapy

## 2016-07-27 ENCOUNTER — Ambulatory Visit: Payer: Medicare Other | Admitting: Physical Therapy

## 2016-07-27 DIAGNOSIS — R293 Abnormal posture: Secondary | ICD-10-CM

## 2016-07-27 DIAGNOSIS — M25511 Pain in right shoulder: Secondary | ICD-10-CM

## 2016-07-27 DIAGNOSIS — M25611 Stiffness of right shoulder, not elsewhere classified: Secondary | ICD-10-CM

## 2016-07-27 DIAGNOSIS — M6281 Muscle weakness (generalized): Secondary | ICD-10-CM

## 2016-07-27 NOTE — Therapy (Addendum)
[facility] [address]  [place], [place], [address] Phone: [phone]   Fax:  [phone]  Physical Therapy Treatment  Patient Details  Name: [name] [name] MRN: [id number] Date of Birth: [birth year] Referring Provider: Dr. Kathryne Hitch  Encounter Date: 07/27/2016      PT End of Session - 07/27/16 1027    Visit Number 30   Number of Visits 36   Date for PT Re-Evaluation 09/02/16   PT Start Time 1021   PT Stop Time 1100   PT Time Calculation (min) 39 min   Activity Tolerance Patient tolerated treatment well   Behavior During Therapy [facility] for tasks assessed/performed      Past Medical History:  Diagnosis Date  . Allergy   . Anxiety   . Arthritis   . Asthma    Bronchial   . Celiac disease   . Full dentures   . Headache    migraines  . History of hiatal hernia   . Hyperlipidemia   . Hypertension   . IBS (irritable bowel syndrome)   . Pneumonia    "years ago"  . PONV (postoperative nausea and vomiting)   . Wears glasses    readers    Past Surgical History:  Procedure Laterality Date  . ABDOMINAL HYSTERECTOMY    . APPENDECTOMY    . BREAST LUMPECTOMY  1985   lt  . CARPAL TUNNEL RELEASE     rt  . CHOLECYSTECTOMY    . ELBOW SURGERY    . EYE SURGERY     both cataracts  . KNEE SURGERY     lt  . NASAL SEPTUM SURGERY    . REVERSE SHOULDER ARTHROPLASTY Right 02/18/2016   Procedure: RIGHT REVERSE TOTAL SHOULDER ARTHROPLASTY;  Surgeon: Ninetta Lights, MD;  Location: [place];  Service: Orthopedics;  Laterality: Right;  . REVERSE TOTAL SHOULDER ARTHROPLASTY Right 02/18/2016  . SHOULDER ACROMIOPLASTY Right 05/09/2014   Procedure: SHOULDER ACROMIOPLASTY;  Surgeon: Ninetta Lights, MD;  Location: [place];  Service: Orthopedics;  Laterality: Right;  . SHOULDER ARTHROSCOPY WITH BICEPSTENOTOMY Right 05/09/2014   Procedure: SHOULDER ARTHROSCOPY WITH BICEPSTENOTOMY;  Surgeon: Ninetta Lights, MD;   Location: [place];  Service: Orthopedics;  Laterality: Right;  Debridement Rotator Cuff Tear  . SHOULDER ARTHROSCOPY WITH ROTATOR CUFF REPAIR Right 12/19/2014   Procedure: RIGHT SHOULDER ARTHROSCOPY WITH ROTATOR CUFF REPAIR,CALCIFIC DEBRIDEMENT EXTENSIVE;  Surgeon: Ninetta Lights, MD;  Location: [place];  Service: Orthopedics;  Laterality: Right;  . SHOULDER ARTHROSCOPY WITH ROTATOR CUFF REPAIR Right 07/10/2015   Procedure: RIGHT SHOULDER ARTHROSCOPY, DEBRIDEMENT  WITH ROTATOR CUFF REPAIR;  Surgeon: Ninetta Lights, MD;  Location: [place];  Service: Orthopedics;  Laterality: Right;  . SHOULDER SURGERY     left  . TONSILLECTOMY AND ADENOIDECTOMY    . VARICOSE VEIN SURGERY    . WRIST ARTHROSCOPY Right 09/20/2013   Procedure: RIGHT ARTHROSCOPY WRIST EXCISION PISIFORM;  Surgeon: Cammie Sickle, MD;  Location: [place];  Service: Orthopedics;  Laterality: Right;    There were no vitals filed for this visit.      Subjective Assessment - 07/27/16 1024    Subjective had an injection in elbow last week - starting to feel like she can move elbow a little bit more   Patient Stated Goals to improve shoulder function   Currently in Pain? Yes   Pain Score 3  Pain Location Shoulder   Pain Orientation Right   Pain Descriptors / Indicators Aching   Pain Frequency Constant   Multiple Pain Sites Yes   Pain Score 4   Pain Location Elbow   Pain Orientation Right   Pain Descriptors / Indicators Sharp;Aching   Pain Type Acute pain            [facility] PT Assessment - 07/27/16 1030      AROM   Right Shoulder Flexion 135 Degrees   Right Shoulder ABduction 123 Degrees   Right Shoulder Internal Rotation --  functional to sacrum - back belt loop   Right Shoulder External Rotation --  functional to occiput                     [facility] Adult PT Treatment/Exercise - 07/27/16 0001      Shoulder Exercises: Seated    Flexion Strengthening;Right;Weights;20 reps   Flexion Weight (lbs) 3   Flexion Limitations some compensation into scaption with shoulder hike   Abduction Strengthening;Right;Weights;20 reps   ABduction Weight (lbs) 3   ABduction Limitations some compensation with alteral trunk lean     Shoulder Exercises: Standing   Flexion Strengthening;Right;15 reps;Theraband   Theraband Level (Shoulder Flexion) Level 2 (Red)   ABduction Strengthening;Right;15 reps;Theraband   Theraband Level (Shoulder ABduction) Level 2 (Red)   Extension Strengthening;Both;15 reps;Theraband   Theraband Level (Shoulder Extension) Level 3 (Green)   Extension Limitations with scap squeeze   Row Strengthening;Both;15 reps;Theraband   Theraband Level (Shoulder Row) Level 3 (Green)   Row Limitations with scap squeeze - not going past hip level   Other Standing Exercises PNF D1 flexion/D2 flexion - yellow tband     Shoulder Exercises: Pulleys   Flexion 3 minutes   ABduction 3 minutes     Modalities   Modalities Iontophoresis     Iontophoresis   Type of Iontophoresis Dexamethasone   Location R anterior shoulder   Dose 1.0 mL   Time 80 mA 4-6 hours                  PT Short Term Goals - 05/11/16 1239      PT SHORT TERM GOAL #1   Title patient to be independent with HEP (04/14/16)   Status Achieved     PT SHORT TERM GOAL #2   Title Patient to improve R shoulder PROM flexion to 120 and ER to 50 (in scapular plane) (04/14/16)   Status Achieved           PT Long Term Goals - 07/27/16 1603      PT LONG TERM GOAL #1   Title patient to be independent with advanced HEP (07/09/16)   Status Achieved     PT LONG TERM GOAL #2   Title Patient to improve AROM of R shoulder to 140 deg flexion in sitting (09/02/16)   Status Partially Met     PT LONG TERM GOAL #3   Title Patient to demonstrate proper postural alignment with ability to self correct (05/12/16)   Status Achieved     PT LONG TERM GOAL #4    Title Patient to demonstrate ability to reach cabinet of shoulder height with minimal compensation with up to 3-5# weighted item (09/02/16)   Status Partially Met  able to reach cabinet height - difficulty with added weight     PT LONG TERM GOAL #5   Title Patient to report improved functional use of R UE (bathing, hair  care, dressing) with minimal compensations (09/02/16)   Status Partially Met  compensations still present               Plan - 12-Aug-2016 1029    Clinical Impression Statement [name] [name] presenting to PT today, cortisone injection to R elbow last week - does continue to have some residual pain in that elbow. Patient with orders allowing for ionto to be placed to R shoulder for conitnued pain. Patient and PT discussing current functional level at this time. Will return to PT next week for ionto patch if effective; if not, PT and patient both agreeing upon 30 day hold due to patients ability to perform HEP independently with excellent compliance. Goals met or partially met at this time - with general strength primary limiting factor.    PT Treatment/Interventions ADLs/Self Care Home Management;Cryotherapy;Electrical Stimulation;Moist Heat;Ultrasound;Therapeutic exercise;Therapeutic activities;Functional mobility training;Patient/family education;Manual techniques;Passive range of motion;Vasopneumatic Device;Taping;Dry needling;Iontophoresis '4mg'$ /ml Dexamethasone   PT Next Visit Plan progress AROM/strength of R shoulder; modalities prn for pain; manual stretching; include elbow: mobilizations, manual, stretching, and strengthening   Consulted and Agree with Plan of Care Patient      Patient will benefit from skilled therapeutic intervention in order to improve the following deficits and impairments:  Decreased activity tolerance, Decreased range of motion, Decreased strength, Impaired UE functional use, Pain  Visit Diagnosis: Right shoulder pain, unspecified chronicity  Stiffness of  right shoulder, not elsewhere classified  Abnormal posture  Muscle weakness (generalized)       G-Codes - 08/12/2016 1630    Functional Assessment Tool Used (Outpatient Only) FOTO: 59 (41% limited)   Functional Limitation Carrying, moving and handling objects   Carrying, Moving and Handling Objects Goal Status ([id number]) At least 40 percent but less than 60 percent impaired, limited or restricted   Carrying, Moving and Handling Objects Discharge Status [phone]) At least 40 percent but less than 60 percent impaired, limited or restricted      Problem List Patient Active Problem List   Diagnosis Date Noted  . 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 Preop cardiovascular exam 07/09/2015  . Chest pain 05/09/2013  . Hypertension 02/23/2012  . Hyperlipidemia 02/23/2012  . Right shoulder pain 01/25/2012  . Left wrist pain 01/16/2012  . Neck pain 12/26/2011     Lanney Gins, PT, DPT 2016/08/12 4:32 PM  PHYSICAL THERAPY DISCHARGE SUMMARY  Visits from Start of Care: 30  Current functional level related to goals / functional outcomes: See above   Remaining deficits: See above   Education / Equipment: HEP  Plan: Patient agrees to discharge.  Patient goals were partially met. Patient is being discharged due to lack of progress.  ?????    Lanney Gins, PT, DPT 09/27/16 8:00 AM   [facility] [address]  [place], [place], [address] Phone: [phone]   Fax:  [phone]  Name: [name] [name] MRN: [id number] Date of Birth: [birth year]

## 2016-07-29 ENCOUNTER — Ambulatory Visit: Payer: Medicare Other | Admitting: Physical Therapy

## 2016-08-03 ENCOUNTER — Ambulatory Visit: Payer: Medicare Other

## 2016-08-05 ENCOUNTER — Ambulatory Visit: Payer: Medicare Other | Admitting: Physical Therapy

## 2016-11-30 ENCOUNTER — Other Ambulatory Visit (HOSPITAL_BASED_OUTPATIENT_CLINIC_OR_DEPARTMENT_OTHER): Payer: Self-pay | Admitting: Obstetrics and Gynecology

## 2016-11-30 DIAGNOSIS — Z780 Asymptomatic menopausal state: Secondary | ICD-10-CM

## 2016-11-30 DIAGNOSIS — Z1231 Encounter for screening mammogram for malignant neoplasm of breast: Secondary | ICD-10-CM

## 2016-12-01 ENCOUNTER — Other Ambulatory Visit (HOSPITAL_BASED_OUTPATIENT_CLINIC_OR_DEPARTMENT_OTHER): Payer: Self-pay | Admitting: Obstetrics and Gynecology

## 2016-12-01 ENCOUNTER — Other Ambulatory Visit (HOSPITAL_BASED_OUTPATIENT_CLINIC_OR_DEPARTMENT_OTHER): Payer: Self-pay | Admitting: Nurse Practitioner

## 2016-12-01 DIAGNOSIS — M810 Age-related osteoporosis without current pathological fracture: Secondary | ICD-10-CM

## 2016-12-14 ENCOUNTER — Ambulatory Visit (HOSPITAL_BASED_OUTPATIENT_CLINIC_OR_DEPARTMENT_OTHER)
Admission: RE | Admit: 2016-12-14 | Discharge: 2016-12-14 | Disposition: A | Discharge status: Home / Self Care | Payer: Medicare Other | Source: Ambulatory Visit | Attending: Obstetrics and Gynecology | Admitting: Obstetrics and Gynecology

## 2016-12-14 ENCOUNTER — Encounter (HOSPITAL_BASED_OUTPATIENT_CLINIC_OR_DEPARTMENT_OTHER): Payer: Self-pay

## 2016-12-14 DIAGNOSIS — Z780 Asymptomatic menopausal state: Secondary | ICD-10-CM | POA: Diagnosis not present

## 2016-12-14 DIAGNOSIS — M810 Age-related osteoporosis without current pathological fracture: Secondary | ICD-10-CM | POA: Diagnosis present

## 2016-12-14 DIAGNOSIS — M85851 Other specified disorders of bone density and structure, right thigh: Secondary | ICD-10-CM | POA: Insufficient documentation

## 2016-12-14 DIAGNOSIS — Z1231 Encounter for screening mammogram for malignant neoplasm of breast: Secondary | ICD-10-CM | POA: Insufficient documentation

## 2017-02-23 ENCOUNTER — Other Ambulatory Visit: Payer: Self-pay | Admitting: Cardiology

## 2017-03-13 IMAGING — MR MR CERVICAL SPINE W/O CM
5 series · 30 of 48 positions shown · non-contrast
Comparison: None.

CLINICAL DATA: Neck pain.  Difficulty raising arms.

EXAM:
MRI CERVICAL SPINE WITHOUT CONTRAST
TECHNIQUE: Multiplanar, multisequence MR imaging of the cervical spine was
performed. No intravenous contrast was administered.

[Series 3: T2 · sagittal · 3.0mm · 0.66mm/px · 6 of 12 slices shown (1 of 2)]
[im 1/12]
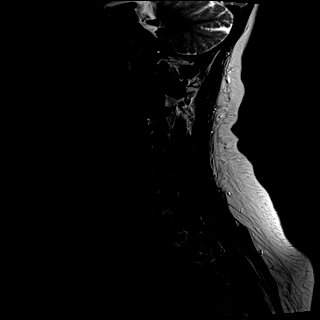
[im 3/12]
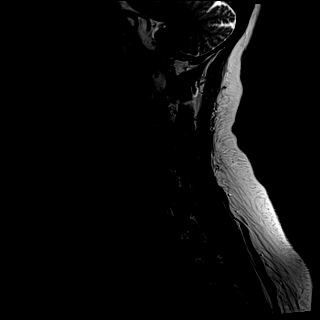
[im 5/12]
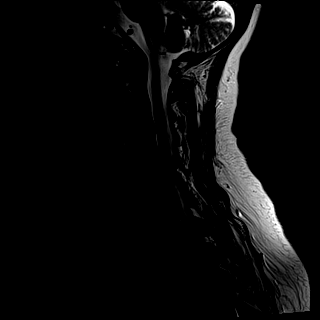
[im 7/12]
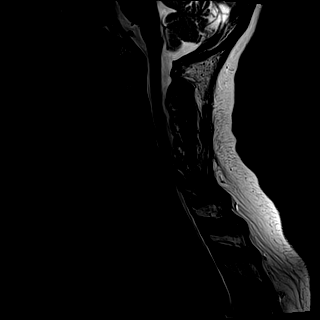
[im 9/12]
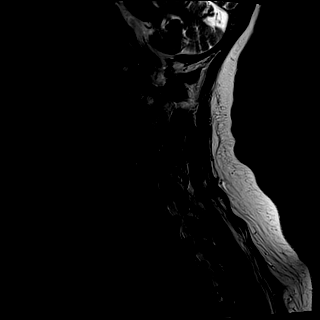
[im 12/12]
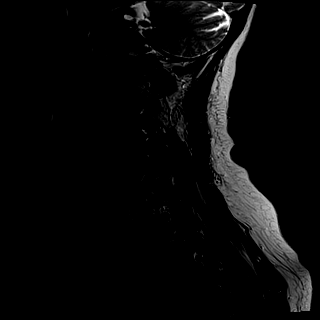

[Series 4: T1 · sagittal · 3.0mm · 0.66mm/px · 7 of 12 slices shown]
[im 1/12]
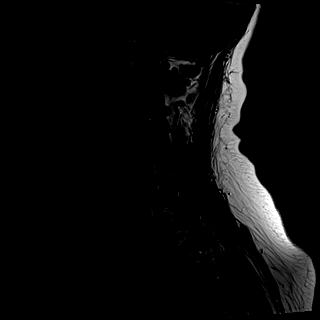
[im 2/12]
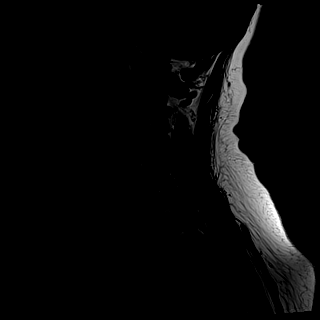
[im 4/12]
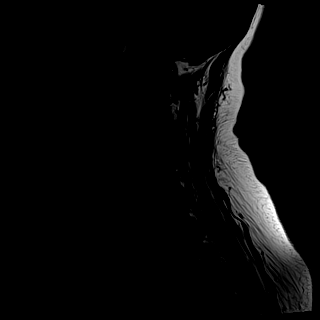
[im 6/12]
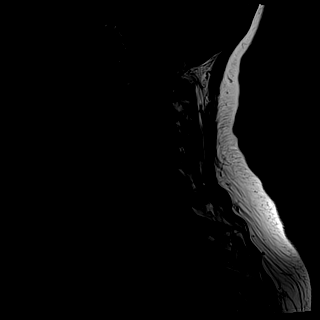
[im 8/12]
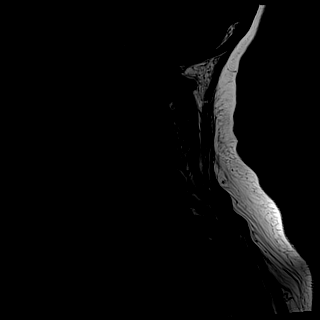
[im 10/12]
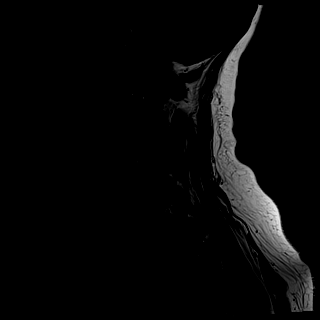
[im 12/12]
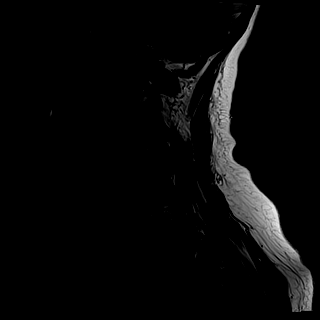

[Series 5: T2 · axial · 3.0mm · 0.56mm/px · z∈[-63,+30]mm · 8 of 26 slices shown (2 of 2)]
[im 1/26]
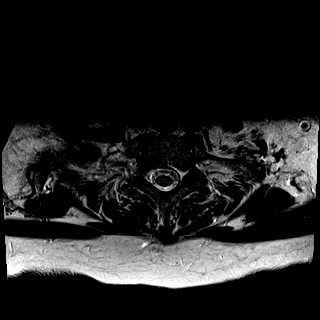
[im 4/26]
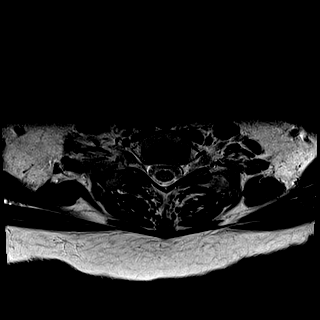
[im 8/26]
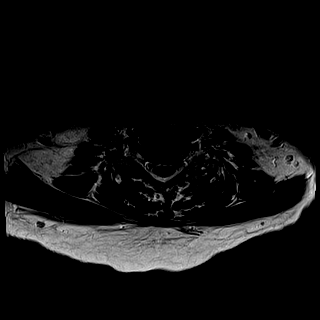
[im 12/26]
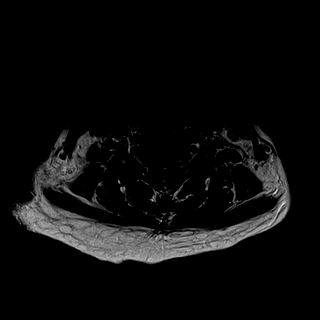
[im 14/26]
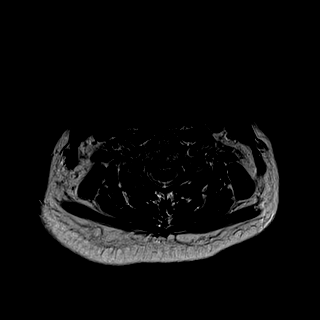
[im 18/26]
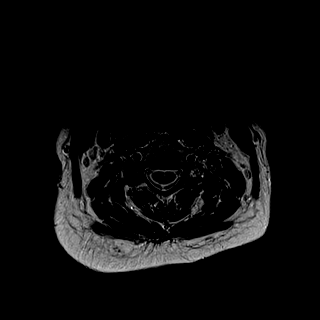
[im 22/26]
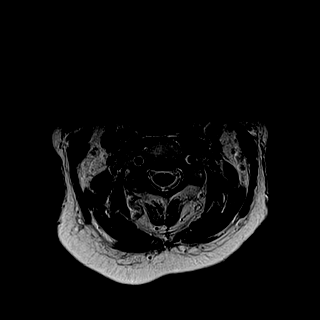
[im 26/26]
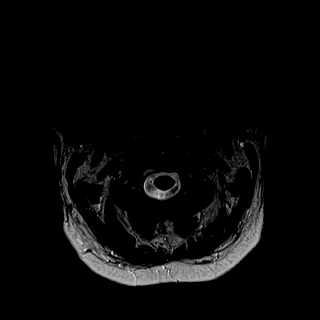

[Series 6: tir sag · sagittal · 3.0mm · 0.41mm/px · 7 of 12 slices shown]
[im 1/12]
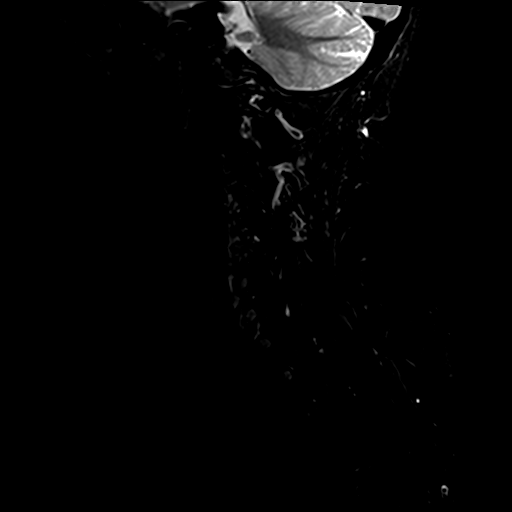
[im 2/12]
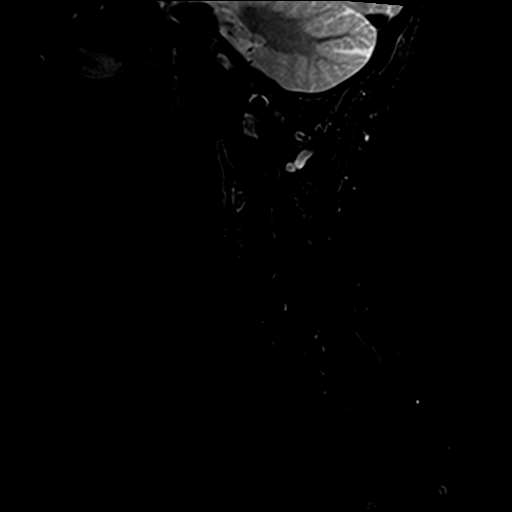
[im 4/12]
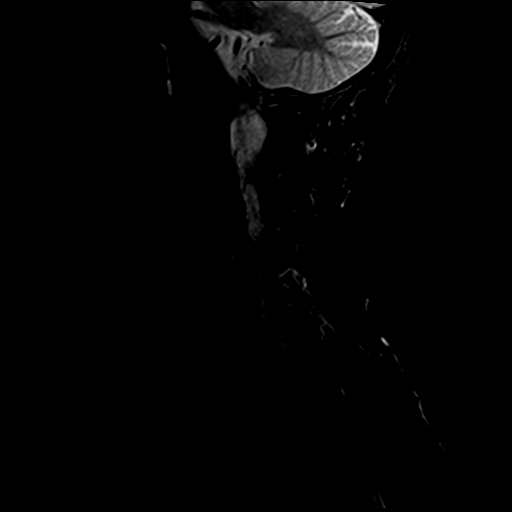
[im 6/12]
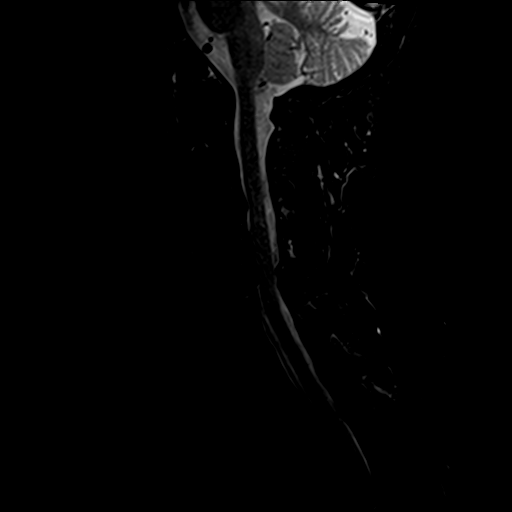
[im 8/12]
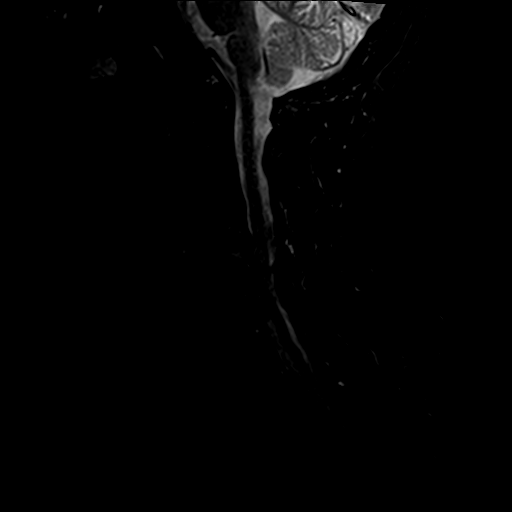
[im 10/12]
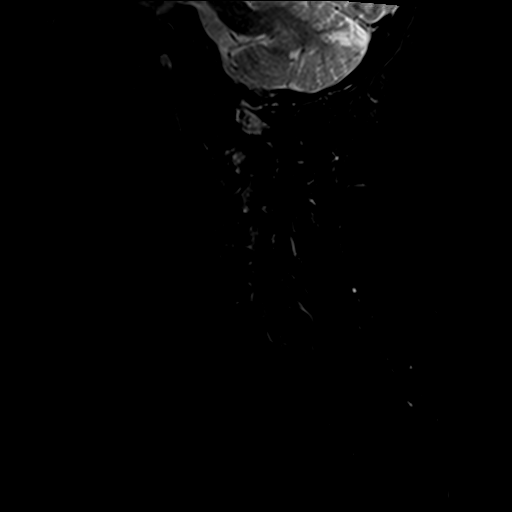
[im 12/12]
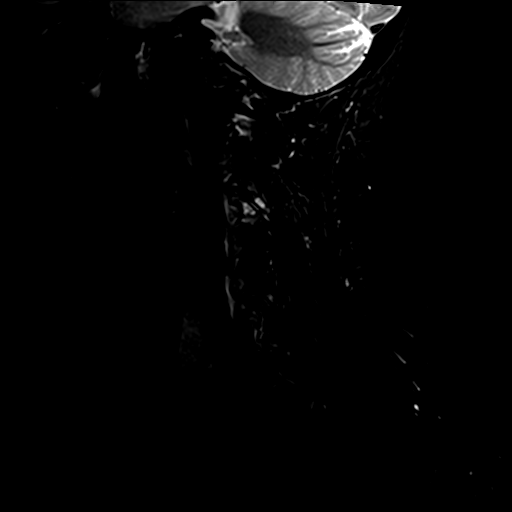

[Series 7: GRE · axial · 3.0mm · 0.35mm/px · z∈[-63,-52]mm · 2 of 26 slices shown]
[im 1/26]
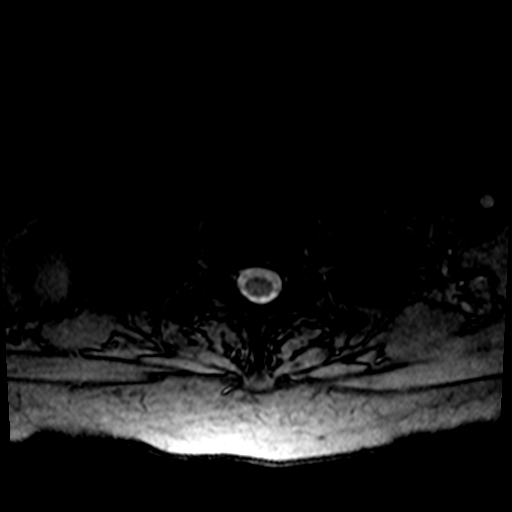
[im 4/26]
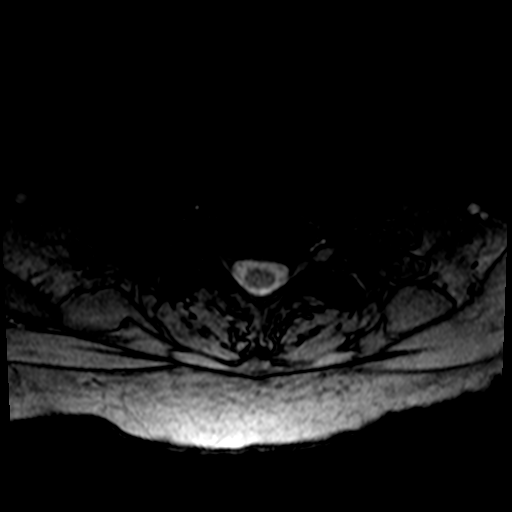

[30 of 48 positions shown; findings below may reference images not displayed]

FINDINGS: Alignment: Reversal of the normal cervical lordotic curve. 2 mm
retrolisthesis C5-6.

Vertebrae: Endplate reactive changes. No worrisome osseous lesion.
Severe facet arthropathy at C3-4 on the LEFT with bone marrow edema.

Cord: Mild stenosis at C4-5 and C5-6.  No abnormal cord signal.

Posterior Fossa: No tonsillar herniation.

Vertebral Arteries: Patent.

Paraspinal tissues: No masses.

Disc levels:

The individual disc spaces were examined as follows:

C2-3: Mild bulge. RIGHT greater than LEFT facet arthropathy.
Foraminal narrowing could affect the RIGHT C3 nerve root.

C3-4: Mild bulge. Severe facet arthropathy on the LEFT along with
LEFT-sided uncinate spurring. LEFT C4 nerve root impingement is
likely.

C4-5: Disc space narrowing. Central disc osteophyte complex with
uncinate spurring. Mild stenosis without cord compression. Either C5
nerve root could be affected.

C5-6: Disc space narrowing. Disc osteophyte complex with 2 mm
retrolisthesis. Central extrusion. Caudally migrated fragment of
disc central and to the RIGHT. Mild cord flattening without abnormal
cord signal. Mild to moderate stenosis with canal diameter 6-7 mm.
RIGHT greater than LEFT C6 nerve root impingement.

C6-7: Central protrusion. Moderate facet arthropathy on the LEFT.
LEFT C7 nerve root impingement is possible.

C7-T1:  Unremarkable.
IMPRESSION: The dominant abnormality is at C5-6, where disc space narrowing, 2
mm retrolisthesis, central extrusion with caudally migrated
fragment, and disc osteophyte complex combine to result in stenosis
with RIGHT greater than LEFT C6 nerve root impingement.

Severe facet arthropathy at C3-4 on the LEFT, with moderate facet
arthropathy at C6-7 on the LEFT.

Potentially symptomatic RIGHT-sided neural impingement at C2-3 and
C4-5 related to bony overgrowth.

## 2017-03-14 ENCOUNTER — Other Ambulatory Visit: Payer: Self-pay | Admitting: Cardiology

## 2017-05-09 NOTE — Progress Notes (Deleted)
HPI: FU hypertension. Myoview in 2009 showed an ejection fraction of 64% and normal perfusion. Echocardiogram in March 2015 showed normal LV function, grade 1 diastolic dysfunction and mild mitral regurgitation. Since she was last seen,   Current Outpatient Medications  Medication Sig Dispense Refill  . acetaminophen (TYLENOL) 325 MG tablet Take 650 mg by mouth every 6 (six) hours as needed (for pain).     Marland Kitchen. aspirin EC 81 MG tablet Take 81 mg by mouth every other day. Evening    . Bepotastine Besilate (BEPREVE) 1.5 % SOLN Place 1 drop into both eyes daily as needed (for allergies/hay fever.).    Marland Kitchen. Biotin 5 MG CAPS Take 5 mg by mouth daily.     . Cholecalciferol (VITAMIN D3) 2000 UNITS capsule Take 2,000 Units by mouth daily.    . diazepam (VALIUM) 2 MG tablet Take 1 tablet (2 mg total) by mouth every 8 (eight) hours as needed for muscle spasms. (Patient not taking: Reported on 03/16/2016) 20 tablet 0  . doxycycline (VIBRAMYCIN) 100 MG capsule Take 100 mg by mouth 2 (two) times daily as needed (rosacea).     Marland Kitchen. HYDROmorphone (DILAUDID) 2 MG tablet Take 1-2 tabs po q4-6 hours prn pain (Patient not taking: Reported on 03/16/2016) 60 tablet 0  . Loperamide HCl (IMODIUM A-D PO) Take by mouth.    . OLIVE LEAF EXTRACT PO Take 1 tablet by mouth daily.    Marland Kitchen. olmesartan (BENICAR) 40 MG tablet TAKE ONE TABLET BY MOUTH ONCE DAILY 90 tablet 3  . ondansetron (ZOFRAN) 4 MG tablet Take 1 tablet (4 mg total) by mouth every 8 (eight) hours as needed for nausea or vomiting. 40 tablet 0  . PREMARIN 1.25 MG tablet Take 1.25 mg by mouth daily.     Marland Kitchen. Resveratrol 250 MG CAPS Take 250 mg by mouth daily.      No current facility-administered medications for this visit.      Past Medical History:  Diagnosis Date  . Allergy   . Anxiety   . Arthritis   . Asthma    Bronchial   . Celiac disease   . Full dentures   . Headache    migraines  . History of hiatal hernia   . Hyperlipidemia   . Hypertension   .  IBS (irritable bowel syndrome)   . Pneumonia    "years ago"  . PONV (postoperative nausea and vomiting)   . Wears glasses    readers    Past Surgical History:  Procedure Laterality Date  . ABDOMINAL HYSTERECTOMY    . APPENDECTOMY    . BREAST BIOPSY Left    benign   . BREAST LUMPECTOMY  1985   lt  . CARPAL TUNNEL RELEASE     rt  . CHOLECYSTECTOMY    . ELBOW SURGERY    . EYE SURGERY     both cataracts  . KNEE SURGERY     lt  . NASAL SEPTUM SURGERY    . REVERSE SHOULDER ARTHROPLASTY Right 02/18/2016   Procedure: RIGHT REVERSE TOTAL SHOULDER ARTHROPLASTY;  Surgeon: [name], MD;  [facility];  Service: Orthopedics;  Laterality: Right;  . REVERSE TOTAL SHOULDER ARTHROPLASTY Right 02/18/2016  . SHOULDER ACROMIOPLASTY Right 05/09/2014   Procedure: SHOULDER ACROMIOPLASTY;  Surgeon: [name], MD;  Location: [facility];  Service: Orthopedics;  Laterality: Right;  . SHOULDER ARTHROSCOPY WITH BICEPSTENOTOMY Right 05/09/2014   Procedure: SHOULDER ARTHROSCOPY WITH BICEPSTENOTOMY;  Surgeon:  Loreta Ave, MD;  Location: [facility];  Service: Orthopedics;  Laterality: Right;  Debridement Rotator Cuff Tear  . SHOULDER ARTHROSCOPY WITH ROTATOR CUFF REPAIR Right 12/19/2014   Procedure: RIGHT SHOULDER ARTHROSCOPY WITH ROTATOR CUFF REPAIR,CALCIFIC DEBRIDEMENT EXTENSIVE;  Surgeon: Loreta Ave, MD;  Location: [facility];  Service: Orthopedics;  Laterality: Right;  . SHOULDER ARTHROSCOPY WITH ROTATOR CUFF REPAIR Right 07/10/2015   Procedure: RIGHT SHOULDER ARTHROSCOPY, DEBRIDEMENT  WITH ROTATOR CUFF REPAIR;  Surgeon: Loreta Ave, MD;  Location: [facility];  Service: Orthopedics;  Laterality: Right;  . SHOULDER SURGERY     left  . TONSILLECTOMY AND ADENOIDECTOMY    . VARICOSE VEIN SURGERY    . WRIST ARTHROSCOPY Right 09/20/2013   Procedure: RIGHT ARTHROSCOPY WRIST EXCISION PISIFORM;  Surgeon: Wyn Forster, MD;   Location: [facility];  Service: Orthopedics;  Laterality: Right;    Social History   Socioeconomic History  . Marital status: Married    Spouse name: Not on file  . Number of children: 3  . Years of education: Not on file  . Highest education level: Not on file  Social Needs  . Financial resource strain: Not on file  . Food insecurity - worry: Not on file  . Food insecurity - inability: Not on file  . Transportation needs - medical: Not on file  . Transportation needs - non-medical: Not on file  Occupational History    Comment: Research scientist (medical)  Tobacco Use  . Smoking status: Never Smoker  . Smokeless tobacco: Never Used  Substance and Sexual Activity  . Alcohol use: Yes    Comment: Occasional glass of wine  . Drug use: No  . Sexual activity: Not on file  Other Topics Concern  . Not on file  Social History Narrative  . Not on file    Family History  Problem Relation Age of Onset  . Hypertension Mother   . Heart attack Father        MI at age 68  . Hyperlipidemia Other   . Hypertension Other   . Diabetes Neg Hx   . Sudden death Neg Hx     ROS: no fevers or chills, productive cough, hemoptysis, dysphasia, odynophagia, melena, hematochezia, dysuria, hematuria, rash, seizure activity, orthopnea, PND, pedal edema, claudication. Remaining systems are negative.  Physical Exam: Well-developed well-nourished in no acute distress.  Skin is warm and dry.  HEENT is normal.  Neck is supple.  Chest is clear to auscultation with normal expansion.  Cardiovascular exam is regular rate and rhythm.  Abdominal exam nontender or distended. No masses palpated. Extremities show no edema. neuro grossly intact  ECG- personally reviewed  A/P  1  Olga Millers, MD

## 2017-05-18 ENCOUNTER — Ambulatory Visit: Payer: Medicare Other | Admitting: Cardiology

## 2017-06-20 NOTE — Progress Notes (Signed)
HPI: FU hypertension. Myoview in 2009 showed an ejection fraction of 64% and normal perfusion. Echocardiogram in March 2015 showed normal LV function, grade 1 diastolic dysfunction and mild mitral regurgitation. Not seen since 3/17; since that time, patient has an occasional pinching sensation in her chest for 1-2 seconds but no exertional chest pain.  She denies dyspnea, palpitations or syncope.  Current Outpatient Medications  Medication Sig Dispense Refill  . acetaminophen (TYLENOL) 325 MG tablet Take 650 mg by mouth every 6 (six) hours as needed (for pain).     Marland Kitchen aspirin EC 81 MG tablet Take 81 mg by mouth every other day. Evening    . Bepotastine Besilate (BEPREVE) 1.5 % SOLN Place 1 drop into both eyes daily as needed (for allergies/hay fever.).    Marland Kitchen Biotin 5 MG CAPS Take 5 mg by mouth daily.     . Cholecalciferol (VITAMIN D3) 2000 UNITS capsule Take 2,000 Units by mouth daily.    Marland Kitchen doxycycline (VIBRAMYCIN) 100 MG capsule Take 100 mg by mouth 2 (two) times daily as needed (rosacea).     . Loperamide HCl (IMODIUM A-D PO) Take by mouth.    . OLIVE LEAF EXTRACT PO Take 1 tablet by mouth daily.    Marland Kitchen olmesartan (BENICAR) 40 MG tablet TAKE ONE TABLET BY MOUTH ONCE DAILY 90 tablet 3  . PREMARIN 1.25 MG tablet Take 1.25 mg by mouth daily.     Marland Kitchen Resveratrol 250 MG CAPS Take 250 mg by mouth daily.      No current facility-administered medications for this visit.      Past Medical History:  Diagnosis Date  . Allergy   . Anxiety   . Arthritis   . Asthma    Bronchial   . Celiac disease   . Full dentures   . Headache    migraines  . History of hiatal hernia   . Hyperlipidemia   . Hypertension   . IBS (irritable bowel syndrome)   . Pneumonia    "years ago"  . PONV (postoperative nausea and vomiting)   . Wears glasses    readers    Past Surgical History:  Procedure Laterality Date  . ABDOMINAL HYSTERECTOMY    . APPENDECTOMY    . BREAST BIOPSY Left    benign   . BREAST  LUMPECTOMY  1985   lt  . CARPAL TUNNEL RELEASE     rt  . CHOLECYSTECTOMY    . ELBOW SURGERY    . EYE SURGERY     both cataracts  . KNEE SURGERY     lt  . NASAL SEPTUM SURGERY    . REVERSE SHOULDER ARTHROPLASTY Right 02/18/2016   Procedure: RIGHT REVERSE TOTAL SHOULDER ARTHROPLASTY;  Surgeon: Loreta Ave, MD;  Location[facility];  Service: Orthopedics;  Laterality: Right;  . REVERSE TOTAL SHOULDER ARTHROPLASTY Right 02/18/2016  . SHOULDER ACROMIOPLASTY Right 05/09/2014   Procedure: SHOULDER ACROMIOPLASTY;  Surgeon: Loreta Ave, MD;  Location: [facility];  Service: Orthopedics;  Laterality: Right;  . SHOULDER ARTHROSCOPY WITH BICEPSTENOTOMY Right 05/09/2014   Procedure: SHOULDER ARTHROSCOPY WITH BICEPSTENOTOMY;  Surgeon: Loreta Ave, MD;  Location: [facility];  Service: Orthopedics;  Laterality: Right;  Debridement Rotator Cuff Tear  . SHOULDER ARTHROSCOPY WITH ROTATOR CUFF REPAIR Right 12/19/2014   Procedure: RIGHT SHOULDER ARTHROSCOPY WITH ROTATOR CUFF REPAIR,CALCIFIC DEBRIDEMENT EXTENSIVE;  Surgeon: Loreta Ave, MD;  Location: [facility];  Service: Orthopedics;  Laterality:  Right;  Marland Kitchen. SHOULDER ARTHROSCOPY WITH ROTATOR CUFF REPAIR Right 07/10/2015   Procedure: RIGHT SHOULDER ARTHROSCOPY, DEBRIDEMENT  WITH ROTATOR CUFF REPAIR;  Surgeon: [name], MD;  Location: [facility];  Service: Orthopedics;  Laterality: Right;  . SHOULDER SURGERY     left  . TONSILLECTOMY AND ADENOIDECTOMY    . VARICOSE VEIN SURGERY    . WRIST ARTHROSCOPY Right 09/20/2013   Procedure: RIGHT ARTHROSCOPY WRIST EXCISION PISIFORM;  Surgeon: [name], MD;  Location: [facility];  Service: Orthopedics;  Laterality: Right;    Social History   Socioeconomic History  . Marital status: Married    Spouse name: Not on file  . Number of children: 3  . Years of education: Not on file  . Highest education level: Not on file    Social Needs  . Financial resource strain: Not on file  . Food insecurity - worry: Not on file  . Food insecurity - inability: Not on file  . Transportation needs - medical: Not on file  . Transportation needs - non-medical: Not on file  Occupational History    Comment: Research scientist (medical)afety consultant  Tobacco Use  . Smoking status: Never Smoker  . Smokeless tobacco: Never Used  Substance and Sexual Activity  . Alcohol use: Yes    Comment: Occasional glass of wine  . Drug use: No  . Sexual activity: Not on file  Other Topics Concern  . Not on file  Social History Narrative  . Not on file    Family History  Problem Relation Age of Onset  . Hypertension Mother   . Heart attack Father        MI at age 652  . Hyperlipidemia Other   . Hypertension Other   . Diabetes Neg Hx   . Sudden death Neg Hx     ROS: no fevers or chills, productive cough, hemoptysis, dysphasia, odynophagia, melena, hematochezia, dysuria, hematuria, rash, seizure activity, orthopnea, PND, pedal edema, claudication. Remaining systems are negative.  Physical Exam: Well-developed well-nourished in no acute distress.  Skin is warm and dry.  HEENT is normal.  Neck is supple.  Chest is clear to auscultation with normal expansion.  Cardiovascular exam is regular rate and rhythm.  Abdominal exam nontender or distended. No masses palpated. Extremities show no edema. neuro grossly intact  ECG-normal sinus rhythm at a rate of 77.  Left anterior fascicular block.  Personally reviewed  A/P  1 hypertension-blood pressure is elevated.  I will increase Benicar to 40 mg daily.  Note she had some dizziness with this in the past.  She will follow her blood pressure and we will adjust medications as needed.  In 1 week check potassium and renal function.  2 hyperlipidemia-check lipids and liver.  3 celiac disease/IBS-managed by primary care.  Olga MillersBrian Crenshaw, MD

## 2017-06-22 ENCOUNTER — Encounter: Payer: Self-pay | Admitting: Cardiology

## 2017-06-22 ENCOUNTER — Ambulatory Visit (INDEPENDENT_AMBULATORY_CARE_PROVIDER_SITE_OTHER): Payer: Medicare Other | Admitting: Cardiology

## 2017-06-22 VITALS — BP 179/93 | HR 77 | Ht 65.5 in | Wt 158.0 lb

## 2017-06-22 DIAGNOSIS — E785 Hyperlipidemia, unspecified: Secondary | ICD-10-CM

## 2017-06-22 DIAGNOSIS — I10 Essential (primary) hypertension: Secondary | ICD-10-CM | POA: Diagnosis not present

## 2017-06-22 MED ORDER — OLMESARTAN MEDOXOMIL 40 MG PO TABS: 40.0000 mg | ORAL_TABLET | Freq: Every day | ORAL | 3 refills | Status: DC

## 2017-06-22 NOTE — Patient Instructions (Signed)
Medication Instructions:  Your physician has recommended you make the following change in your medication:  1. Increase Benicar (40 mg) daily, sent in to patient's requested pharmacy.   Labwork: Your physician recommends that you return for a FASTING lipid profile in one week. Paper work given to patient today for lab.   Testing/Procedures: -None  Follow-Up: Your physician wants you to follow-up in: 1 year  Dr. Jens Somrenshaw. You will receive a reminder letter in the mail two months in advance. If you don't receive a letter, please call our office to schedule the follow-up appointment.   Any Other Special Instructions Will Be Listed Below (If Applicable).     If you need a refill on your cardiac medications before your next appointment, please call your pharmacy.

## 2017-06-30 ENCOUNTER — Encounter: Payer: Self-pay | Admitting: Cardiology

## 2017-07-05 ENCOUNTER — Other Ambulatory Visit: Payer: Self-pay | Admitting: Cardiology

## 2017-07-06 LAB — BASIC METABOLIC PANEL
BUN / CREAT RATIO: 10 — AB (ref 12–28)
BUN: 9 mg/dL (ref 8–27)
CO2: 22 mmol/L (ref 20–29)
Calcium: 9.6 mg/dL (ref 8.7–10.3)
Chloride: 104 mmol/L (ref 96–106)
Creatinine, Ser: 0.89 mg/dL (ref 0.57–1.00)
GFR calc Af Amer: 74 mL/min/{1.73_m2} (ref >59)
GFR calc non Af Amer: 64 mL/min/{1.73_m2} (ref >59)
GLUCOSE: 92 mg/dL (ref 65–99)
Potassium: 4.3 mmol/L (ref 3.5–5.2)
SODIUM: 141 mmol/L (ref 134–144)

## 2017-07-06 LAB — LIPID PANEL
CHOLESTEROL TOTAL: 253 mg/dL — AB (ref 100–199)
Chol/HDL Ratio: 2.4 ratio (ref 0.0–4.4)
HDL: 106 mg/dL (ref >39)
LDL Calculated: 119 mg/dL — ABNORMAL HIGH (ref 0–99)
TRIGLYCERIDES: 142 mg/dL (ref 0–149)
VLDL Cholesterol Cal: 28 mg/dL (ref 5–40)

## 2017-07-06 LAB — HEPATIC FUNCTION PANEL
ALBUMIN: 4.3 g/dL (ref 3.5–4.8)
ALT: 10 IU/L (ref 0–32)
AST: 14 IU/L (ref 0–40)
Alkaline Phosphatase: 76 IU/L (ref 39–117)
Bilirubin Total: 0.3 mg/dL (ref 0.0–1.2)
Bilirubin, Direct: 0.08 mg/dL (ref 0.00–0.40)
TOTAL PROTEIN: 7.3 g/dL (ref 6.0–8.5)

## 2017-10-31 ENCOUNTER — Other Ambulatory Visit (HOSPITAL_BASED_OUTPATIENT_CLINIC_OR_DEPARTMENT_OTHER): Payer: Self-pay | Admitting: Obstetrics and Gynecology

## 2017-10-31 DIAGNOSIS — Z1231 Encounter for screening mammogram for malignant neoplasm of breast: Secondary | ICD-10-CM

## 2017-11-26 ENCOUNTER — Emergency Department (HOSPITAL_BASED_OUTPATIENT_CLINIC_OR_DEPARTMENT_OTHER)
Admission: EM | Admit: 2017-11-26 | Discharge: 2017-11-26 | Disposition: A | Discharge status: Home / Self Care | Payer: Medicare Other | Attending: Emergency Medicine | Admitting: Emergency Medicine

## 2017-11-26 ENCOUNTER — Other Ambulatory Visit: Payer: Self-pay

## 2017-11-26 ENCOUNTER — Encounter (HOSPITAL_BASED_OUTPATIENT_CLINIC_OR_DEPARTMENT_OTHER): Payer: Self-pay | Admitting: Emergency Medicine

## 2017-11-26 DIAGNOSIS — I83891 Varicose veins of right lower extremities with other complications: Secondary | ICD-10-CM | POA: Insufficient documentation

## 2017-11-26 DIAGNOSIS — J45909 Unspecified asthma, uncomplicated: Secondary | ICD-10-CM | POA: Insufficient documentation

## 2017-11-26 DIAGNOSIS — Z96611 Presence of right artificial shoulder joint: Secondary | ICD-10-CM | POA: Diagnosis not present

## 2017-11-26 DIAGNOSIS — I10 Essential (primary) hypertension: Secondary | ICD-10-CM | POA: Insufficient documentation

## 2017-11-26 DIAGNOSIS — Z79899 Other long term (current) drug therapy: Secondary | ICD-10-CM | POA: Diagnosis not present

## 2017-11-26 DIAGNOSIS — Z7982 Long term (current) use of aspirin: Secondary | ICD-10-CM | POA: Insufficient documentation

## 2017-11-26 DIAGNOSIS — I83899 Varicose veins of unspecified lower extremities with other complications: Secondary | ICD-10-CM

## 2017-11-26 NOTE — ED Triage Notes (Signed)
Patient states that she has spider veins on her right foot and one " sprung a leak" - to her right foot. She was unable to get it to stop today

## 2017-11-26 NOTE — ED Provider Notes (Signed)
[facility] EMERGENCY DEPARTMENT Provider Note   CSN: [id number] Arrival date & time: 11/26/17  1759     History   Chief Complaint Chief Complaint  Patient presents with  . Wound Check    HPI [name] [name] is a 72 y.o. female hx of varicose veins here with bleeding from varicose veins. States that prior to arrival, one of her veins popped and started bleeding. She applied tea leaves and put on towel over it and wasn't able to get it to stop so came to the ED. Denies any trauma or injury. States that she had previous laser surgery for this before and had previous bleeding from the varicose veins. Denies bleeding disorder. Not on blood thinners.   The history is provided by the patient.    Past Medical History:  Diagnosis Date  . Allergy   . Anxiety   . Arthritis   . Asthma    Bronchial   . Celiac disease   . Full dentures   . Headache    migraines  . History of hiatal hernia   . Hyperlipidemia   . Hypertension   . IBS (irritable bowel syndrome)   . Pneumonia    "years ago"  . PONV (postoperative nausea and vomiting)   . Wears glasses    readers    Patient Active Problem List   Diagnosis Date Noted  . 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 Preop cardiovascular exam 07/09/2015  . Chest pain 05/09/2013  . Hypertension 02/23/2012  . Hyperlipidemia 02/23/2012  . Right shoulder pain 01/25/2012  . Left wrist pain 01/16/2012  . Neck pain 12/26/2011    Past Surgical History:  Procedure Laterality Date  . ABDOMINAL HYSTERECTOMY    . APPENDECTOMY    . BREAST BIOPSY Left    benign   . BREAST LUMPECTOMY  1985   lt  . CARPAL TUNNEL RELEASE     rt  . CHOLECYSTECTOMY    . ELBOW SURGERY    . EYE SURGERY     both cataracts  . KNEE SURGERY     lt  . NASAL SEPTUM SURGERY    . REVERSE SHOULDER ARTHROPLASTY Right 02/18/2016   Procedure: RIGHT REVERSE TOTAL SHOULDER ARTHROPLASTY;  Surgeon: [name], MD;  [facility];  Service: Orthopedics;  Laterality: Right;  . REVERSE TOTAL SHOULDER ARTHROPLASTY Right 02/18/2016  . SHOULDER ACROMIOPLASTY Right 05/09/2014   Procedure: SHOULDER ACROMIOPLASTY;  Surgeon: [name], MD;  Location: [facility];  Service: Orthopedics;  Laterality: Right;  . SHOULDER ARTHROSCOPY WITH BICEPSTENOTOMY Right 05/09/2014   Procedure: SHOULDER ARTHROSCOPY WITH BICEPSTENOTOMY;  Surgeon: [name], MD;  Location: [facility];  Service: Orthopedics;  Laterality: Right;  Debridement Rotator Cuff Tear  . SHOULDER ARTHROSCOPY WITH ROTATOR CUFF REPAIR Right 12/19/2014   Procedure: RIGHT SHOULDER ARTHROSCOPY WITH ROTATOR CUFF REPAIR,CALCIFIC DEBRIDEMENT EXTENSIVE;  Surgeon: [name], MD;  Location: [facility];  Service: Orthopedics;  Laterality: Right;  . SHOULDER ARTHROSCOPY WITH ROTATOR CUFF REPAIR Right 07/10/2015   Procedure: RIGHT SHOULDER ARTHROSCOPY, DEBRIDEMENT  WITH ROTATOR CUFF REPAIR;  Surgeon: [name], MD;  Location: [facility];  Service: Orthopedics;  Laterality: Right;  . SHOULDER SURGERY     left  . TONSILLECTOMY AND ADENOIDECTOMY    . VARICOSE VEIN SURGERY    . WRIST ARTHROSCOPY Right 09/20/2013   Procedure: RIGHT ARTHROSCOPY WRIST EXCISION PISIFORM;  Surgeon: Margarita Ranaobert Sypher Jr  V, MD;  Location: [facility];  Service: Orthopedics;  Laterality: Right;     OB History   None      Home Medications    Prior to Admission medications   Medication Sig Start Date End Date Taking? Authorizing Provider  acetaminophen (TYLENOL) 325 MG tablet Take 650 mg by mouth every 6 (six) hours as needed (for pain).     [provider]  aspirin EC 81 MG tablet Take 81 mg by mouth every other day. Evening    [provider]  Bepotastine Besilate (BEPREVE) 1.5 % SOLN Place 1 drop into both eyes daily as needed (for allergies/hay fever.).    [provider]  Biotin 5 MG CAPS  Take 5 mg by mouth daily.     [provider]  Cholecalciferol (VITAMIN D3) 2000 UNITS capsule Take 2,000 Units by mouth daily.    [provider]  doxycycline (VIBRAMYCIN) 100 MG capsule Take 100 mg by mouth 2 (two) times daily as needed (rosacea).     [provider]  Loperamide HCl (IMODIUM A-D PO) Take by mouth.    [provider]  OLIVE LEAF EXTRACT PO Take 1 tablet by mouth daily.    [provider]  olmesartan (BENICAR) 40 MG tablet Take 1 tablet (40 mg total) by mouth daily. 06/22/17   Lewayne Buntingrenshaw, Brian S, MD  PREMARIN 1.25 MG tablet Take 1.25 mg by mouth daily.  12/08/11   [provider]  Resveratrol 250 MG CAPS Take 250 mg by mouth daily.     [provider]    Family History Family History  Problem Relation Age of Onset  . Hypertension Mother   . Heart attack Father        MI at age 74  . Hyperlipidemia Other   . Hypertension Other   . Diabetes Neg Hx   . Sudden death Neg Hx     Social History Social History   Tobacco Use  . Smoking status: Never Smoker  . Smokeless tobacco: Never Used  Substance Use Topics  . Alcohol use: Yes    Comment: Occasional glass of wine  . Drug use: No     Allergies   Contrast media [iodinated diagnostic agents]; Glucosamine; Ioxaglate; Metrizamide; Penicillins; Shellfish allergy; Shellfish-derived products; Sulfacetamide sodium; Aspirin; Sulfa antibiotics; Sulfasalazine; and Codeine   Review of Systems Review of Systems  Skin: Positive for wound.  All other systems reviewed and are negative.    Physical Exam Updated Vital Signs BP (!) 161/88 (BP Location: Left Arm)   Pulse 85   Temp 98.6 F (37 C) (Oral)   Resp 18   Ht 5\' 5"  (1.651 m)   Wt 67.6 kg   SpO2 100%   BMI 24.79 kg/m   Physical Exam  Constitutional: She appears well-developed.  HENT:  Head: Normocephalic.  Eyes: Pupils are equal, round, and reactive to light.  Neck: Normal range of motion.    Cardiovascular: Normal rate.  Pulmonary/Chest: Effort normal.  Abdominal: Soft.  Musculoskeletal:  R leg with varicose veins. There is small clot on the dorsal aspect of the foot. Bleeding stopped, neurovascular intact.   Neurological: She is alert.  Skin: Skin is warm.  Psychiatric: She has a normal mood and affect.  Nursing note and vitals reviewed.    ED Treatments / Results  Labs (all labs ordered are listed, but only abnormal results are displayed) Labs Reviewed - No data to display  EKG None  Radiology  No results found.  Procedures Procedures (including critical care time)  Medications Ordered in ED Medications - No data to display   Wound care I applied surgicel and wound seal. Also put dressing on the wound.    Initial Impression / Assessment and Plan / ED Course  I have reviewed the triage vital signs and the nursing notes.  Pertinent labs & imaging results that were available during my care of the patient were reviewed by me and considered in my medical decision making (see chart for details).    [name] [name] is a 72 y.o. female here with bleeding from varicose veins. Hx of the same. Bleeding stopped on arrival. Hemodynamically stable. I applied surgicel, wound seal and placed dressing in case it starts bleeding again. Stable for discharge.    Final Clinical Impressions(s) / ED Diagnoses   Final diagnoses:  None    ED Discharge Orders    None       Charlynne Pander, MD 11/26/17 1946

## 2017-11-26 NOTE — Discharge Instructions (Signed)
If you have bleeding, put wound seal on it and keep some pressure. If the bleeding doesn't stop, please return to the ED.   See your doctor  Return to ER if you have uncontrolled bleeding.

## 2017-12-15 ENCOUNTER — Ambulatory Visit (HOSPITAL_BASED_OUTPATIENT_CLINIC_OR_DEPARTMENT_OTHER)
Admission: RE | Admit: 2017-12-15 | Discharge: 2017-12-15 | Disposition: A | Discharge status: Home / Self Care | Payer: Medicare Other | Source: Ambulatory Visit | Attending: Obstetrics and Gynecology | Admitting: Obstetrics and Gynecology

## 2017-12-15 DIAGNOSIS — Z1231 Encounter for screening mammogram for malignant neoplasm of breast: Secondary | ICD-10-CM | POA: Insufficient documentation

## 2017-12-23 IMAGING — CR DG CHEST 2V
2 series · 2 of 2 positions shown · non-contrast
Comparison: None.

CLINICAL DATA: Preop evaluation.  History of bronchitis.

EXAM:
CHEST  2 VIEW

[w chest pa]
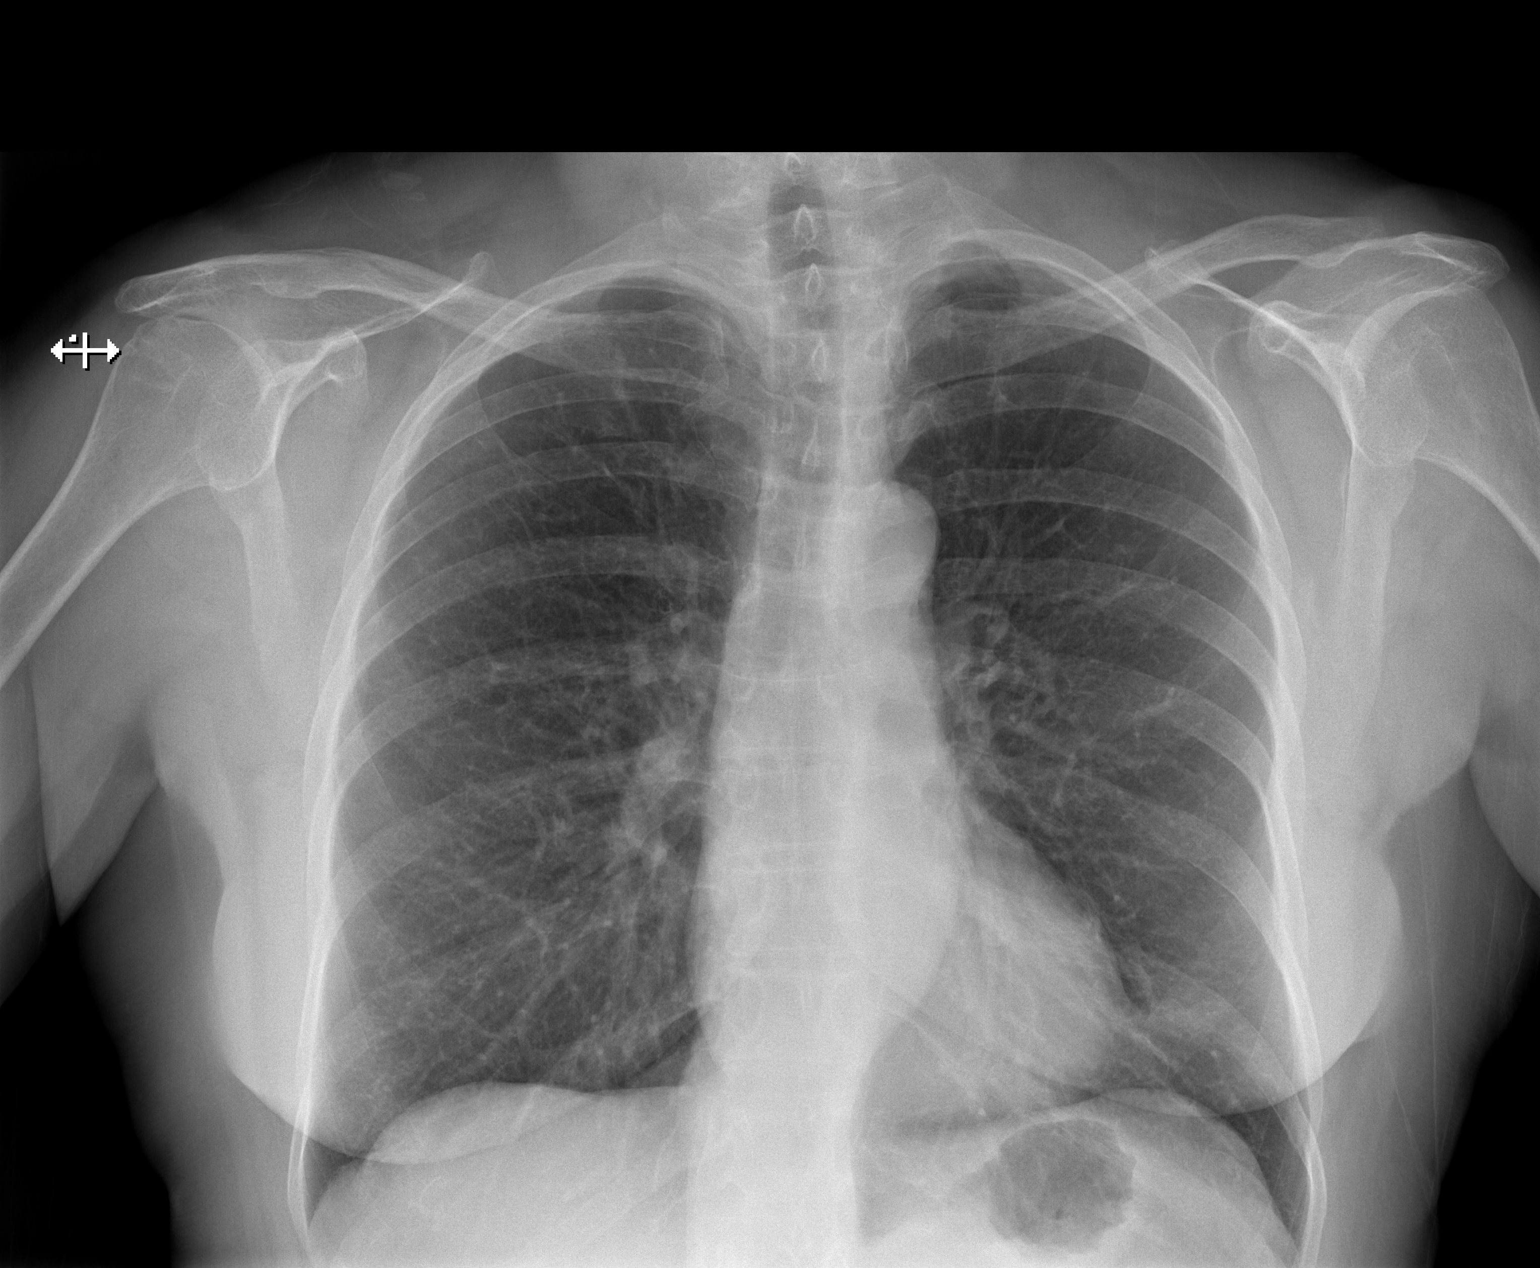

[w chest lat]
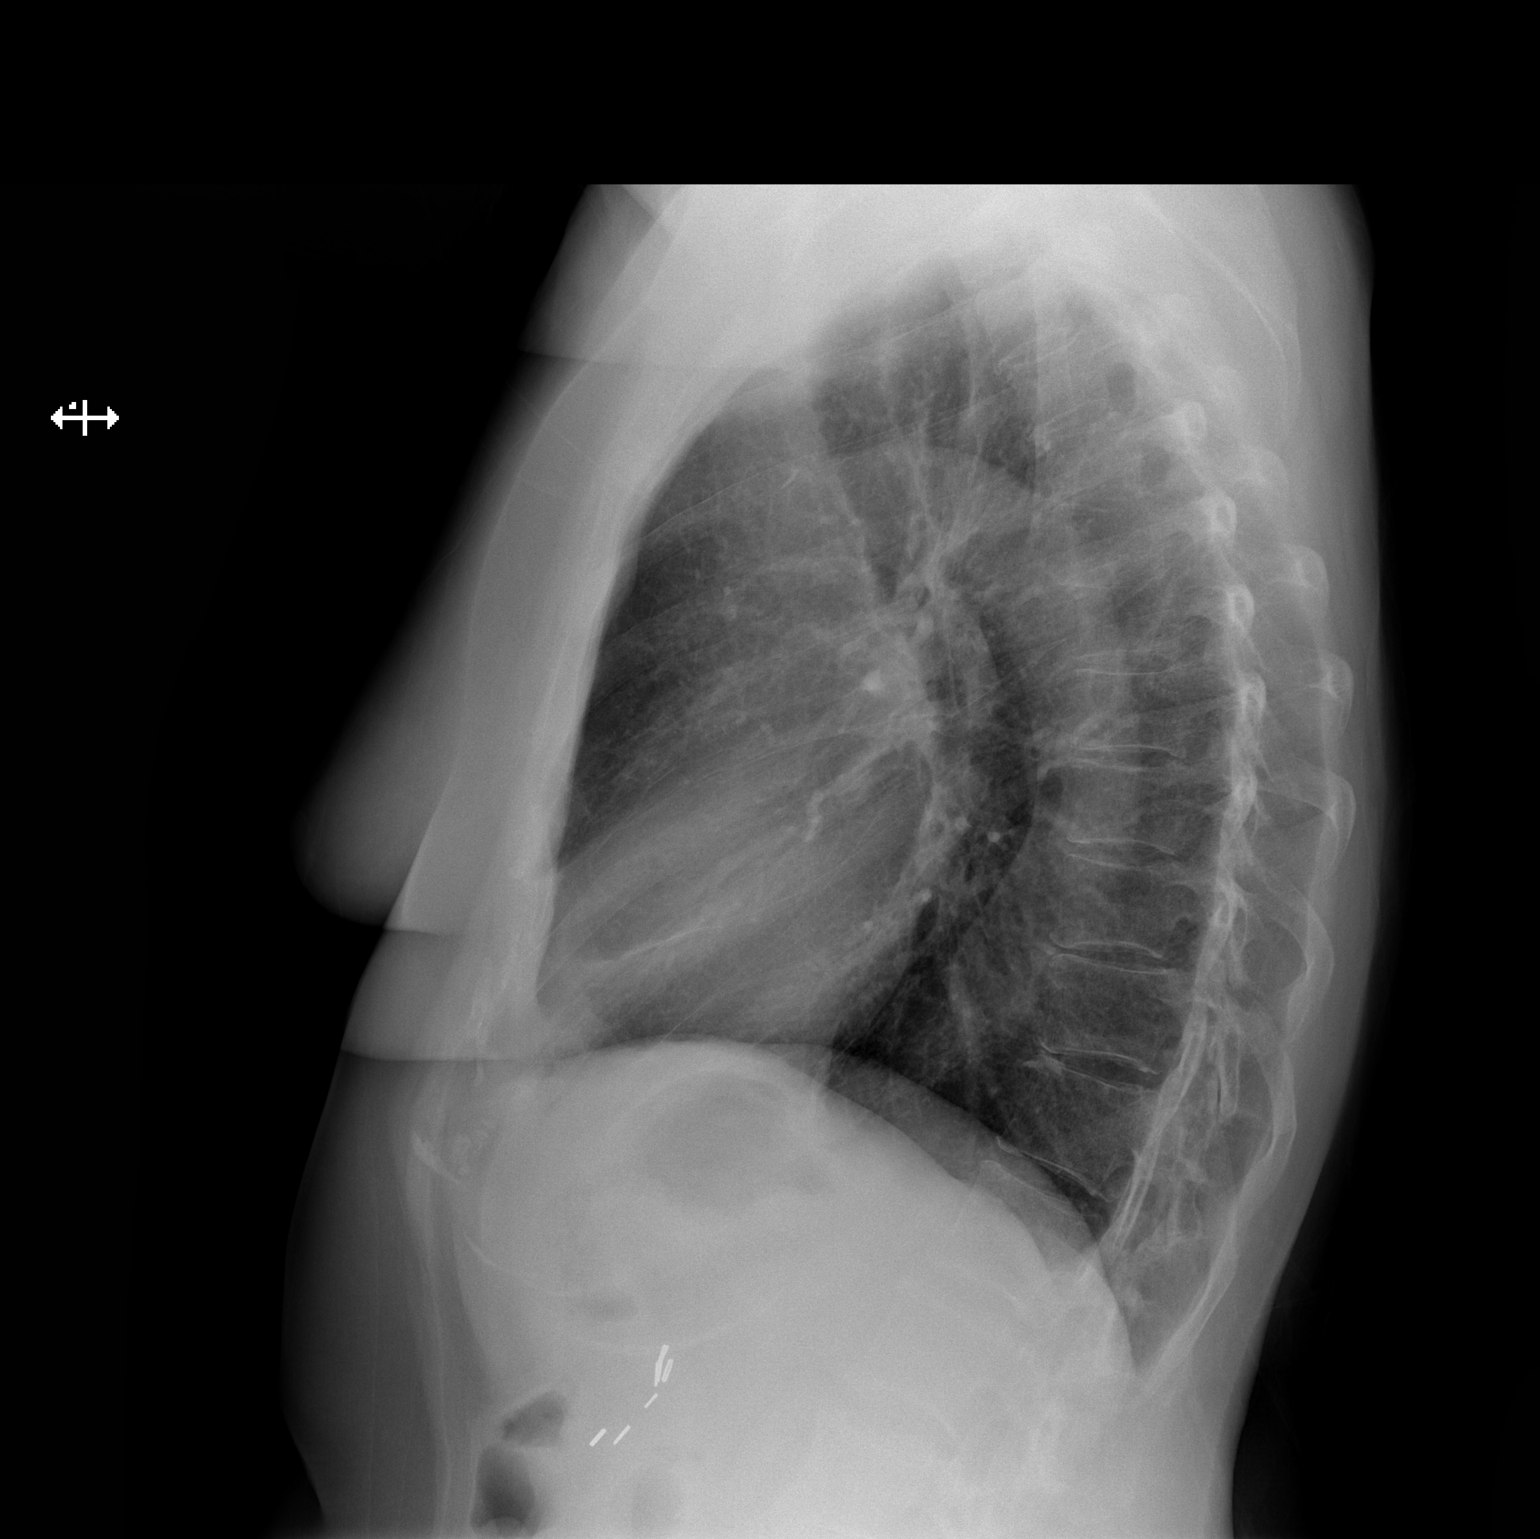

[2 of 2 positions shown; findings below may reference images not displayed]

FINDINGS: Lungs are adequately inflated without focal consolidation or
effusion. Subtle linear scarring over the anterior lung bases on the
lateral film. Cardiomediastinal silhouette is within normal. Very
minimal degenerative change of the spine. Surgical clips over the
upper abdomen.
IMPRESSION: No active cardiopulmonary disease.

## 2018-01-18 ENCOUNTER — Other Ambulatory Visit (HOSPITAL_BASED_OUTPATIENT_CLINIC_OR_DEPARTMENT_OTHER): Payer: Self-pay | Admitting: Orthopaedic Surgery

## 2018-01-18 DIAGNOSIS — M84311S Stress fracture, right shoulder, sequela: Secondary | ICD-10-CM

## 2018-01-19 ENCOUNTER — Ambulatory Visit (HOSPITAL_BASED_OUTPATIENT_CLINIC_OR_DEPARTMENT_OTHER)
Admission: RE | Admit: 2018-01-19 | Discharge: 2018-01-19 | Disposition: A | Discharge status: Home / Self Care | Payer: Medicare Other | Source: Ambulatory Visit | Attending: Orthopaedic Surgery | Admitting: Orthopaedic Surgery

## 2018-01-19 DIAGNOSIS — M84311S Stress fracture, right shoulder, sequela: Secondary | ICD-10-CM | POA: Insufficient documentation

## 2018-01-19 DIAGNOSIS — Z96611 Presence of right artificial shoulder joint: Secondary | ICD-10-CM | POA: Insufficient documentation

## 2018-01-25 ENCOUNTER — Other Ambulatory Visit (HOSPITAL_BASED_OUTPATIENT_CLINIC_OR_DEPARTMENT_OTHER): Payer: Self-pay | Admitting: Orthopaedic Surgery

## 2018-01-25 DIAGNOSIS — T148XXA Other injury of unspecified body region, initial encounter: Secondary | ICD-10-CM

## 2018-05-03 ENCOUNTER — Ambulatory Visit (INDEPENDENT_AMBULATORY_CARE_PROVIDER_SITE_OTHER): Payer: Medicare Other | Admitting: Cardiology

## 2018-05-03 ENCOUNTER — Encounter: Payer: Self-pay | Admitting: Cardiology

## 2018-05-03 VITALS — BP 152/102 | HR 82 | Ht 65.0 in | Wt 159.0 lb

## 2018-05-03 DIAGNOSIS — I2510 Atherosclerotic heart disease of native coronary artery without angina pectoris: Secondary | ICD-10-CM

## 2018-05-03 DIAGNOSIS — I10 Essential (primary) hypertension: Secondary | ICD-10-CM

## 2018-05-03 DIAGNOSIS — E7800 Pure hypercholesterolemia, unspecified: Secondary | ICD-10-CM

## 2018-05-03 MED ORDER — HYDROCHLOROTHIAZIDE 12.5 MG PO CAPS: 12.5000 mg | ORAL_CAPSULE | Freq: Every day | ORAL | 3 refills | Status: DC

## 2018-05-03 NOTE — Patient Instructions (Signed)
Medication Instructions:   INCREASE ASPIRIN TO 81 MG EVERYDAY  START HCTZ 12.5 MG ONCE DAILY  Labwork:  Your physician recommends that you return for lab work in: ONE WEEK PRIOR TO EATING  Testing/Procedures:  Your physician has requested that you have a stress echocardiogram. For further information please visit [email or web address] Please follow instruction sheet as given.  AT [facility]  Follow-Up:  Your physician recommends that you schedule a follow-up appointment in: 12 MONTHS WITH DR Shon Millet IN November TO SCHEDULE APPOINTMENT IN January 2021

## 2018-05-03 NOTE — Progress Notes (Signed)
HPI: FU hypertension. Myoview in 2009 showed an ejection fraction of 64% and normal perfusion. Echocardiogram in March 2015 showed normal LV function, grade 1 diastolic dysfunction and mild mitral regurgitation. Since last seen, she has dyspnea with more vigorous activities but not routine activities.  No orthopnea, PND or pedal edema.  She does not have exertional chest pain though she occasionally has a stabbing pain in her left back radiating to her chest lasting 30 seconds or less.  No syncope.  Had recent shoulder CT which also visualized atherosclerotic disease in the coronaries and aortic arch.  Current Outpatient Medications  Medication Sig Dispense Refill  . acetaminophen (TYLENOL) 325 MG tablet Take 650 mg by mouth every 6 (six) hours as needed (for pain).     Marland Kitchen aspirin EC 81 MG tablet Take 81 mg by mouth every other day. Evening    . Bepotastine Besilate (BEPREVE) 1.5 % SOLN Place 1 drop into both eyes daily as needed (for allergies/hay fever.).    Marland Kitchen Biotin 5 MG CAPS Take 5 mg by mouth daily.     . Cholecalciferol (VITAMIN D3) 2000 UNITS capsule Take 2,000 Units by mouth daily.    Marland Kitchen doxycycline (VIBRAMYCIN) 100 MG capsule Take 100 mg by mouth 2 (two) times daily as needed (rosacea).     . Loperamide HCl (IMODIUM A-D PO) Take by mouth.    . OLIVE LEAF EXTRACT PO Take 1 tablet by mouth daily.    Marland Kitchen olmesartan (BENICAR) 40 MG tablet Take 1 tablet (40 mg total) by mouth daily. 90 tablet 3  . PREMARIN 1.25 MG tablet Take 1.25 mg by mouth daily.     Marland Kitchen Resveratrol 250 MG CAPS Take 250 mg by mouth daily.      No current facility-administered medications for this visit.      Past Medical History:  Diagnosis Date  . Allergy   . Anxiety   . Arthritis   . Asthma    Bronchial   . Celiac disease   . Full dentures   . Headache    migraines  . History of hiatal hernia   . Hyperlipidemia   . Hypertension   . IBS (irritable bowel syndrome)   . Pneumonia    "years ago"  . PONV  (postoperative nausea and vomiting)   . Wears glasses    readers    Past Surgical History:  Procedure Laterality Date  . ABDOMINAL HYSTERECTOMY    . APPENDECTOMY    . BREAST BIOPSY Left    benign   . BREAST LUMPECTOMY  1985   lt  . CARPAL TUNNEL RELEASE     rt  . CHOLECYSTECTOMY    . ELBOW SURGERY    . EYE SURGERY     both cataracts  . KNEE SURGERY     lt  . NASAL SEPTUM SURGERY    . REVERSE SHOULDER ARTHROPLASTY Right 02/18/2016   Procedure: RIGHT REVERSE TOTAL SHOULDER ARTHROPLASTY;  Surgeon: Loreta Ave, MD;  Location: [facility] OR;  Service: Orthopedics;  Laterality: Right;  . REVERSE TOTAL SHOULDER ARTHROPLASTY Right 02/18/2016  . SHOULDER ACROMIOPLASTY Right 05/09/2014   Procedure: SHOULDER ACROMIOPLASTY;  Surgeon: Loreta Ave, MD;  Location: [facility];  Service: Orthopedics;  Laterality: Right;  . SHOULDER ARTHROSCOPY WITH BICEPSTENOTOMY Right 05/09/2014   Procedure: SHOULDER ARTHROSCOPY WITH BICEPSTENOTOMY;  Surgeon: Loreta Ave, MD;  Location: [facility];  Service: Orthopedics;  Laterality: Right;  Debridement Rotator Cuff Tear  .  SHOULDER ARTHROSCOPY WITH ROTATOR CUFF REPAIR Right 12/19/2014   Procedure: RIGHT SHOULDER ARTHROSCOPY WITH ROTATOR CUFF REPAIR,CALCIFIC DEBRIDEMENT EXTENSIVE;  Surgeon: [name], MD;  Location: [facility];  Service: Orthopedics;  Laterality: Right;  . SHOULDER ARTHROSCOPY WITH ROTATOR CUFF REPAIR Right 07/10/2015   Procedure: RIGHT SHOULDER ARTHROSCOPY, DEBRIDEMENT  WITH ROTATOR CUFF REPAIR;  Surgeon: [name], MD;  Location: [facility];  Service: Orthopedics;  Laterality: Right;  . SHOULDER SURGERY     left  . TONSILLECTOMY AND ADENOIDECTOMY    . VARICOSE VEIN SURGERY    . WRIST ARTHROSCOPY Right 09/20/2013   Procedure: RIGHT ARTHROSCOPY WRIST EXCISION PISIFORM;  Surgeon: [name], MD;  Location:  SURGERY CENTER;  Service: Orthopedics;  Laterality:  Right;    Social History   Socioeconomic History  . Marital status: Married    Spouse name: Not on file  . Number of children: 3  . Years of education: Not on file  . Highest education level: Not on file  Occupational History    Comment: Research scientist (medical)afety consultant  Social Needs  . Financial resource strain: Not on file  . Food insecurity:    Worry: Not on file    Inability: Not on file  . Transportation needs:    Medical: Not on file    Non-medical: Not on file  Tobacco Use  . Smoking status: Never Smoker  . Smokeless tobacco: Never Used  Substance and Sexual Activity  . Alcohol use: Yes    Comment: Occasional glass of wine  . Drug use: No  . Sexual activity: Not on file  Lifestyle  . Physical activity:    Days per week: Not on file    Minutes per session: Not on file  . Stress: Not on file  Relationships  . Social connections:    Talks on phone: Not on file    Gets together: Not on file    Attends religious service: Not on file    Active member of club or organization: Not on file    Attends meetings of clubs or organizations: Not on file    Relationship status: Not on file  . Intimate partner violence:    Fear of current or ex partner: Not on file    Emotionally abused: Not on file    Physically abused: Not on file    Forced sexual activity: Not on file  Other Topics Concern  . Not on file  Social History Narrative  . Not on file    Family History  Problem Relation Age of Onset  . Hypertension Mother   . Heart attack Father        MI at age 75  . Hyperlipidemia Other   . Hypertension Other   . Diabetes Neg Hx   . Sudden death Neg Hx     ROS: no fevers or chills, productive cough, hemoptysis, dysphasia, odynophagia, melena, hematochezia, dysuria, hematuria, rash, seizure activity, orthopnea, PND, pedal edema, claudication. Remaining systems are negative.  Physical Exam: Well-developed well-nourished in no acute distress.  Skin is warm and dry.  HEENT is  normal.  Neck is supple.  Chest is clear to auscultation with normal expansion.  Cardiovascular exam is regular rate and rhythm.  Abdominal exam nontender or distended. No masses palpated. Extremities show no edema. neuro grossly intact  ECG-sinus rhythm at a rate of 82, left anterior fascicular block, incomplete right bundle branch block.  Personally reviewed  A/P  1 hypertension-blood  pressure is elevated.  Add HCTZ 12.5 mg daily.  Check potassium and renal function in 1 week.  2 hyperlipidemia-check lipids and initiate therapy as needed.  3 coronary atherosclerosis-noted on recent CT scan.  Some dyspnea on exertion.  I will arrange a stress echocardiogram for risk stratification.  Increase aspirin to 81 mg daily.  Will also likely need statin.  4 irritable bowel syndrome/celiac disease-this is followed by primary care.  Olga Millers, MD

## 2018-05-05 ENCOUNTER — Other Ambulatory Visit: Payer: Self-pay | Admitting: *Deleted

## 2018-05-05 DIAGNOSIS — I10 Essential (primary) hypertension: Secondary | ICD-10-CM

## 2018-05-05 MED ORDER — AMLODIPINE BESYLATE 5 MG PO TABS: 5.0000 mg | ORAL_TABLET | Freq: Every day | ORAL | 3 refills | Status: DC

## 2018-05-05 MED ORDER — OLMESARTAN MEDOXOMIL 40 MG PO TABS: 40.0000 mg | ORAL_TABLET | Freq: Every day | ORAL | 3 refills | Status: DC

## 2018-05-05 NOTE — Telephone Encounter (Signed)
DC HCTZ; norvasc 5 mg daily  [name] [name]   Message through my chart with new medication. New script sent to the pharmacy

## 2018-05-15 ENCOUNTER — Ambulatory Visit (HOSPITAL_BASED_OUTPATIENT_CLINIC_OR_DEPARTMENT_OTHER)
Admission: RE | Admit: 2018-05-15 | Discharge: 2018-05-15 | Disposition: A | Discharge status: Home / Self Care | Payer: Medicare Other | Source: Ambulatory Visit | Attending: Cardiology | Admitting: Cardiology

## 2018-05-15 DIAGNOSIS — I2510 Atherosclerotic heart disease of native coronary artery without angina pectoris: Secondary | ICD-10-CM

## 2018-05-15 NOTE — Progress Notes (Signed)
  Echocardiogram Echocardiogram Stress Test has been performed.  [name] [name] [name] 05/15/2018, 11:11 AM

## 2018-05-20 LAB — BASIC METABOLIC PANEL
BUN/Creatinine Ratio: 18 (ref 12–28)
BUN: 14 mg/dL (ref 8–27)
CO2: 21 mmol/L (ref 20–29)
Calcium: 9.3 mg/dL (ref 8.7–10.3)
Chloride: 101 mmol/L (ref 96–106)
Creatinine, Ser: 0.8 mg/dL (ref 0.57–1.00)
GFR calc Af Amer: 84 mL/min/{1.73_m2} (ref >59)
GFR calc non Af Amer: 73 mL/min/{1.73_m2} (ref >59)
Glucose: 80 mg/dL (ref 65–99)
Potassium: 5.1 mmol/L (ref 3.5–5.2)
Sodium: 139 mmol/L (ref 134–144)

## 2018-11-01 ENCOUNTER — Other Ambulatory Visit (HOSPITAL_BASED_OUTPATIENT_CLINIC_OR_DEPARTMENT_OTHER): Payer: Self-pay | Admitting: Obstetrics and Gynecology

## 2018-11-01 DIAGNOSIS — Z1231 Encounter for screening mammogram for malignant neoplasm of breast: Secondary | ICD-10-CM

## 2018-12-19 ENCOUNTER — Other Ambulatory Visit: Payer: Self-pay

## 2018-12-19 ENCOUNTER — Ambulatory Visit (HOSPITAL_BASED_OUTPATIENT_CLINIC_OR_DEPARTMENT_OTHER)
Admission: RE | Admit: 2018-12-19 | Discharge: 2018-12-19 | Disposition: A | Discharge status: Home / Self Care | Payer: Medicare Other | Source: Ambulatory Visit | Attending: Obstetrics and Gynecology | Admitting: Obstetrics and Gynecology

## 2018-12-19 ENCOUNTER — Encounter (HOSPITAL_BASED_OUTPATIENT_CLINIC_OR_DEPARTMENT_OTHER): Payer: Self-pay

## 2018-12-19 DIAGNOSIS — Z1231 Encounter for screening mammogram for malignant neoplasm of breast: Secondary | ICD-10-CM | POA: Insufficient documentation

## 2019-06-05 NOTE — Progress Notes (Signed)
HPI: FU hypertension. Myoview in 2009 showed an ejection fraction of 64% and normal perfusion. Echocardiogram in March 2015 showed normal LV function, grade 1 diastolic dysfunction and mild mitral regurgitation.Had previous shoulder CT which also visualized atherosclerotic disease in the coronaries and aortic arch.  Stress echocardiogram February 2020 normal.  Since last seen he has dyspnea with more vigorous activities but not routine activities.  No orthopnea, PND, pedal edema, exertional chest pain or syncope.  Current Outpatient Medications  Medication Sig Dispense Refill  . acetaminophen (TYLENOL) 325 MG tablet Take 650 mg by mouth every 6 (six) hours as needed (for pain).     Marland Kitchen aspirin EC 81 MG tablet Take 81 mg by mouth daily. Evening     . Bepotastine Besilate (BEPREVE) 1.5 % SOLN Place 1 drop into both eyes daily as needed (for allergies/hay fever.).    Marland Kitchen Biotin 5 MG CAPS Take 5 mg by mouth daily.     . Cholecalciferol (VITAMIN D3) 2000 UNITS capsule Take 2,000 Units by mouth daily.    Marland Kitchen doxycycline (VIBRAMYCIN) 100 MG capsule Take 100 mg by mouth 2 (two) times daily as needed (rosacea).     . Loperamide HCl (IMODIUM A-D PO) Take by mouth.    . OLIVE LEAF EXTRACT PO Take 1 tablet by mouth daily.    Marland Kitchen olmesartan (BENICAR) 40 MG tablet Take 1 tablet (40 mg total) by mouth daily. 90 tablet 3  . PREMARIN 1.25 MG tablet Take 1.25 mg by mouth daily.     Marland Kitchen Resveratrol 250 MG CAPS Take 250 mg by mouth daily.      No current facility-administered medications for this visit.     Past Medical History:  Diagnosis Date  . Allergy   . Anxiety   . Arthritis   . Asthma    Bronchial   . Celiac disease   . Full dentures   . Headache    migraines  . History of hiatal hernia   . Hyperlipidemia   . Hypertension   . IBS (irritable bowel syndrome)   . Pneumonia    "years ago"  . PONV (postoperative nausea and vomiting)   . Wears glasses    readers    Past Surgical History:   Procedure Laterality Date  . ABDOMINAL HYSTERECTOMY    . APPENDECTOMY    . BREAST BIOPSY Left    benign   . BREAST LUMPECTOMY  1985   lt  . CARPAL TUNNEL RELEASE     rt  . CHOLECYSTECTOMY    . ELBOW SURGERY    . EYE SURGERY     both cataracts  . KNEE SURGERY     lt  . NASAL SEPTUM SURGERY    . REVERSE SHOULDER ARTHROPLASTY Right 02/18/2016   Procedure: RIGHT REVERSE TOTAL SHOULDER ARTHROPLASTY;  Surgeon: Loreta Ave, MD;  Location: [facility] OR;  Service: Orthopedics;  Laterality: Right;  . REVERSE TOTAL SHOULDER ARTHROPLASTY Right 02/18/2016  . SHOULDER ACROMIOPLASTY Right 05/09/2014   Procedure: SHOULDER ACROMIOPLASTY;  Surgeon: Loreta Ave, MD;  Location: [facility];  Service: Orthopedics;  Laterality: Right;  . SHOULDER ARTHROSCOPY WITH BICEPSTENOTOMY Right 05/09/2014   Procedure: SHOULDER ARTHROSCOPY WITH BICEPSTENOTOMY;  Surgeon: Loreta Ave, MD;  Location: [facility];  Service: Orthopedics;  Laterality: Right;  Debridement Rotator Cuff Tear  . SHOULDER ARTHROSCOPY WITH ROTATOR CUFF REPAIR Right 12/19/2014   Procedure: RIGHT SHOULDER ARTHROSCOPY WITH ROTATOR CUFF REPAIR,CALCIFIC DEBRIDEMENT EXTENSIVE;  Surgeon:  Loreta Ave, MD;  Location: [facility];  Service: Orthopedics;  Laterality: Right;  . SHOULDER ARTHROSCOPY WITH ROTATOR CUFF REPAIR Right 07/10/2015   Procedure: RIGHT SHOULDER ARTHROSCOPY, DEBRIDEMENT  WITH ROTATOR CUFF REPAIR;  Surgeon: Loreta Ave, MD;  Location: [facility];  Service: Orthopedics;  Laterality: Right;  . SHOULDER SURGERY     left  . TONSILLECTOMY AND ADENOIDECTOMY    . VARICOSE VEIN SURGERY    . WRIST ARTHROSCOPY Right 09/20/2013   Procedure: RIGHT ARTHROSCOPY WRIST EXCISION PISIFORM;  Surgeon: Wyn Forster, MD;  Location: [facility];  Service: Orthopedics;  Laterality: Right;    Social History   Socioeconomic History  . Marital status: Married    Spouse name:  Not on file  . Number of children: 3  . Years of education: Not on file  . Highest education level: Not on file  Occupational History    Comment: Research scientist (medical)  Tobacco Use  . Smoking status: Never Smoker  . Smokeless tobacco: Never Used  Substance and Sexual Activity  . Alcohol use: Yes    Comment: Occasional glass of wine  . Drug use: No  . Sexual activity: Not on file  Other Topics Concern  . Not on file  Social History Narrative  . Not on file   Social Determinants of Health   Financial Resource Strain:   . Difficulty of Paying Living Expenses: Not on file  Food Insecurity:   . Worried About Programme researcher, broadcasting/film/video in the Last Year: Not on file  . Ran Out of Food in the Last Year: Not on file  Transportation Needs:   . Lack of Transportation (Medical): Not on file  . Lack of Transportation (Non-Medical): Not on file  Physical Activity:   . Days of Exercise per Week: Not on file  . Minutes of Exercise per Session: Not on file  Stress:   . Feeling of Stress : Not on file  Social Connections:   . Frequency of Communication with Friends and Family: Not on file  . Frequency of Social Gatherings with Friends and Family: Not on file  . Attends Religious Services: Not on file  . Active Member of Clubs or Organizations: Not on file  . Attends Banker Meetings: Not on file  . Marital Status: Not on file  Intimate Partner Violence:   . Fear of Current or Ex-Partner: Not on file  . Emotionally Abused: Not on file  . Physically Abused: Not on file  . Sexually Abused: Not on file    Family History  Problem Relation Age of Onset  . Hypertension Mother   . Heart attack Father        MI at age 61  . Hyperlipidemia Other   . Hypertension Other   . Diabetes Neg Hx   . Sudden death Neg Hx     ROS: no fevers or chills, productive cough, hemoptysis, dysphasia, odynophagia, melena, hematochezia, dysuria, hematuria, rash, seizure activity, orthopnea, PND, pedal  edema, claudication. Remaining systems are negative.  Physical Exam: Well-developed well-nourished in no acute distress.  Skin is warm and dry.  HEENT is normal.  Neck is supple.  Chest is clear to auscultation with normal expansion.  Cardiovascular exam is regular rate and rhythm.  Abdominal exam nontender or distended. No masses palpated. Extremities show no edema. neuro grossly intact  ECG-sinus tachycardia, occasional PVC, left anterior fascicular block, left ventricular hypertrophy.  Personally reviewed  A/P  1 coronary artery disease-noted on previous CT scan demonstrating coronary calcification.  Follow-up stress echocardiogram normal.  Continue medical therapy with aspirin and add statin.  2 hypertension-blood pressure elevated.  Add HCTZ 12.5 mg daily.  Check potassium and renal function in 1 week.  Follow blood pressure and adjust regimen as needed.  3 hyperlipidemia-given history of atherosclerosis we will add Crestor 10 mg daily though patient is somewhat hesitant.  Check lipids and liver in 12 weeks.  Kirk Ruths, MD

## 2019-06-06 ENCOUNTER — Ambulatory Visit (INDEPENDENT_AMBULATORY_CARE_PROVIDER_SITE_OTHER): Payer: Medicare Other | Admitting: Cardiology

## 2019-06-06 ENCOUNTER — Encounter: Payer: Self-pay | Admitting: Cardiology

## 2019-06-06 ENCOUNTER — Other Ambulatory Visit: Payer: Self-pay

## 2019-06-06 VITALS — BP 148/80 | HR 104 | Ht 65.0 in | Wt 162.0 lb

## 2019-06-06 DIAGNOSIS — I2510 Atherosclerotic heart disease of native coronary artery without angina pectoris: Secondary | ICD-10-CM | POA: Diagnosis not present

## 2019-06-06 DIAGNOSIS — E7800 Pure hypercholesterolemia, unspecified: Secondary | ICD-10-CM | POA: Diagnosis not present

## 2019-06-06 DIAGNOSIS — I10 Essential (primary) hypertension: Secondary | ICD-10-CM | POA: Diagnosis not present

## 2019-06-06 MED ORDER — HYDROCHLOROTHIAZIDE 12.5 MG PO CAPS: 12.5000 mg | ORAL_CAPSULE | Freq: Every day | ORAL | 3 refills | Status: DC

## 2019-06-06 MED ORDER — ROSUVASTATIN CALCIUM 10 MG PO TABS: 10.0000 mg | ORAL_TABLET | Freq: Every day | ORAL | 3 refills | Status: DC

## 2019-06-06 NOTE — Patient Instructions (Signed)
Medication Instructions:  START HCTZ 12.5 MG ONCE DAILY  START ROSUVASTATIN 10 MG ONCE DAILY  *If you need a refill on your cardiac medications before your next appointment, please call your pharmacy*  Lab Work: Your physician recommends that you return for lab work in: ONE WEEK  Your physician recommends that you return for lab work in: 12 WEEKS PRIOR TO EATING  If you have labs (blood work) drawn today and your tests are completely normal, you will receive your results only by: Marland Kitchen MyChart Message (if you have MyChart) OR . A paper copy in the mail If you have any lab test that is abnormal or we need to change your treatment, we will call you to review the results.   Follow-Up: At [facility], you and your health needs are our priority.  As part of our continuing mission to provide you with exceptional heart care, we have created designated Provider Care Teams.  These Care Teams include your primary Cardiologist (physician) and Advanced Practice Providers (APPs -  Physician Assistants and Nurse Practitioners) who all work together to provide you with the care you need, when you need it.  Your next appointment:   12 month(s)  The format for your next appointment:   Either In Person or Virtual  Provider:   Olga Millers, MD

## 2019-09-11 ENCOUNTER — Encounter: Payer: Self-pay | Admitting: *Deleted

## 2019-10-08 ENCOUNTER — Other Ambulatory Visit: Payer: Self-pay | Admitting: Cardiology

## 2019-10-08 DIAGNOSIS — I10 Essential (primary) hypertension: Secondary | ICD-10-CM

## 2019-10-09 ENCOUNTER — Other Ambulatory Visit: Payer: Self-pay

## 2019-11-06 ENCOUNTER — Other Ambulatory Visit (HOSPITAL_BASED_OUTPATIENT_CLINIC_OR_DEPARTMENT_OTHER): Payer: Self-pay | Admitting: Obstetrics and Gynecology

## 2019-11-06 DIAGNOSIS — Z1231 Encounter for screening mammogram for malignant neoplasm of breast: Secondary | ICD-10-CM

## 2019-12-24 ENCOUNTER — Encounter (HOSPITAL_BASED_OUTPATIENT_CLINIC_OR_DEPARTMENT_OTHER): Payer: Self-pay

## 2019-12-24 ENCOUNTER — Other Ambulatory Visit: Payer: Self-pay

## 2019-12-24 ENCOUNTER — Ambulatory Visit (HOSPITAL_BASED_OUTPATIENT_CLINIC_OR_DEPARTMENT_OTHER)
Admission: RE | Admit: 2019-12-24 | Discharge: 2019-12-24 | Disposition: A | Discharge status: Home / Self Care | Payer: Medicare Other | Source: Ambulatory Visit | Attending: Obstetrics and Gynecology | Admitting: Obstetrics and Gynecology

## 2019-12-24 DIAGNOSIS — Z1231 Encounter for screening mammogram for malignant neoplasm of breast: Secondary | ICD-10-CM | POA: Diagnosis present

## 2020-07-23 ENCOUNTER — Other Ambulatory Visit: Payer: Self-pay | Admitting: Cardiology

## 2020-07-23 DIAGNOSIS — I10 Essential (primary) hypertension: Secondary | ICD-10-CM

## 2020-12-12 ENCOUNTER — Other Ambulatory Visit (HOSPITAL_BASED_OUTPATIENT_CLINIC_OR_DEPARTMENT_OTHER): Payer: Self-pay | Admitting: Family Medicine

## 2020-12-12 DIAGNOSIS — Z1231 Encounter for screening mammogram for malignant neoplasm of breast: Secondary | ICD-10-CM

## 2021-01-06 ENCOUNTER — Other Ambulatory Visit: Payer: Self-pay

## 2021-01-06 ENCOUNTER — Ambulatory Visit (HOSPITAL_BASED_OUTPATIENT_CLINIC_OR_DEPARTMENT_OTHER)
Admission: RE | Admit: 2021-01-06 | Discharge: 2021-01-06 | Disposition: A | Discharge status: Home / Self Care | Payer: Medicare Other | Source: Ambulatory Visit | Attending: Family Medicine | Admitting: Family Medicine

## 2021-01-06 ENCOUNTER — Encounter (HOSPITAL_BASED_OUTPATIENT_CLINIC_OR_DEPARTMENT_OTHER): Payer: Self-pay

## 2021-01-06 DIAGNOSIS — Z1231 Encounter for screening mammogram for malignant neoplasm of breast: Secondary | ICD-10-CM | POA: Insufficient documentation

## 2021-07-13 ENCOUNTER — Other Ambulatory Visit: Payer: Self-pay | Admitting: Cardiology

## 2021-07-13 DIAGNOSIS — I10 Essential (primary) hypertension: Secondary | ICD-10-CM

## 2021-08-10 ENCOUNTER — Telehealth: Payer: Self-pay | Admitting: Cardiology

## 2021-08-10 DIAGNOSIS — I10 Essential (primary) hypertension: Secondary | ICD-10-CM

## 2021-08-10 MED ORDER — OLMESARTAN MEDOXOMIL 40 MG PO TABS: 40.0000 mg | ORAL_TABLET | Freq: Every day | ORAL | 3 refills | Status: DC

## 2021-08-10 NOTE — Telephone Encounter (Signed)
?*  STAT* If patient is at the pharmacy, call can be transferred to refill team. ? ? ?1. Which medications need to be refilled? (please list name of each medication and dose if known) olmesartan (BENICAR) 40 MG tablet ? ?2. Which pharmacy/location (including street and city if local pharmacy) is medication to be sent to? [facility] [address] [place], [place] - [address] ? ?3. Do they need a 30 day or 90 day supply? Needs enough medication to last her until her 08/16 appt.   ?

## 2021-08-10 NOTE — Telephone Encounter (Signed)
Spoke with pt, Refill sent to the pharmacy electronically.  

## 2021-11-11 NOTE — Progress Notes (Signed)
HPI:U hypertension. Myoview in 2009 showed an ejection fraction of 64% and normal perfusion. Echocardiogram in March 2015 showed normal LV function, grade 1 diastolic dysfunction and mild mitral regurgitation. Had previous shoulder CT which also visualized atherosclerotic disease in the coronaries and aortic arch.  Stress echocardiogram February 2020 normal.  Since last seen she denies dyspnea, chest pain, palpitations or syncope.  Current Outpatient Medications  Medication Sig Dispense Refill   acetaminophen (TYLENOL) 325 MG tablet Take 650 mg by mouth every 6 (six) hours as needed (for pain).      aspirin EC 81 MG tablet Take 81 mg by mouth daily. Evening      Bepotastine Besilate (BEPREVE) 1.5 % SOLN Place 1 drop into both eyes daily as needed (for allergies/hay fever.).     Biotin 5 MG CAPS Take 5 mg by mouth daily.      Cholecalciferol (VITAMIN D3) 2000 UNITS capsule Take 2,000 Units by mouth daily.     Loperamide HCl (IMODIUM A-D PO) Take by mouth.     OLIVE LEAF EXTRACT PO Take 1 tablet by mouth daily.     olmesartan (BENICAR) 40 MG tablet Take 1 tablet (40 mg total) by mouth daily. 90 tablet 3   PREMARIN 1.25 MG tablet Take 1.25 mg by mouth daily.      Resveratrol 250 MG CAPS Take 250 mg by mouth daily.      No current facility-administered medications for this visit.     Past Medical History:  Diagnosis Date   Allergy    Anxiety    Arthritis    Asthma    Bronchial    Celiac disease    Full dentures    Headache    migraines   History of hiatal hernia    Hyperlipidemia    Hypertension    IBS (irritable bowel syndrome)    Pneumonia    "years ago"   PONV (postoperative nausea and vomiting)    Wears glasses    readers    Past Surgical History:  Procedure Laterality Date   ABDOMINAL HYSTERECTOMY     APPENDECTOMY     BREAST BIOPSY Left    benign    BREAST EXCISIONAL BIOPSY Left 1985   CARPAL TUNNEL RELEASE     rt   CHOLECYSTECTOMY     ELBOW SURGERY      EYE SURGERY     both cataracts   KNEE SURGERY     lt   NASAL SEPTUM SURGERY     REVERSE SHOULDER ARTHROPLASTY Right 02/18/2016   Procedure: RIGHT REVERSE TOTAL SHOULDER ARTHROPLASTY;  Surgeon: Loreta Ave, MD;  Location: [facility] OR;  Service: Orthopedics;  Laterality: Right;   REVERSE TOTAL SHOULDER ARTHROPLASTY Right 02/18/2016   SHOULDER ACROMIOPLASTY Right 05/09/2014   Procedure: SHOULDER ACROMIOPLASTY;  Surgeon: Loreta Ave, MD;  Location: [facility];  Service: Orthopedics;  Laterality: Right;   SHOULDER ARTHROSCOPY WITH BICEPSTENOTOMY Right 05/09/2014   Procedure: SHOULDER ARTHROSCOPY WITH BICEPSTENOTOMY;  Surgeon: Loreta Ave, MD;  Location: [facility];  Service: Orthopedics;  Laterality: Right;  Debridement Rotator Cuff Tear   SHOULDER ARTHROSCOPY WITH ROTATOR CUFF REPAIR Right 12/19/2014   Procedure: RIGHT SHOULDER ARTHROSCOPY WITH ROTATOR CUFF REPAIR,CALCIFIC DEBRIDEMENT EXTENSIVE;  Surgeon: Loreta Ave, MD;  Location:  SURGERY CENTER;  Service: Orthopedics;  Laterality: Right;   SHOULDER ARTHROSCOPY WITH ROTATOR CUFF REPAIR Right 07/10/2015   Procedure: RIGHT SHOULDER ARTHROSCOPY, DEBRIDEMENT  WITH ROTATOR CUFF REPAIR;  Surgeon: Loreta Ave, MD;  Location: [facility];  Service: Orthopedics;  Laterality: Right;   SHOULDER SURGERY     left   TONSILLECTOMY AND ADENOIDECTOMY     VARICOSE VEIN SURGERY     WRIST ARTHROSCOPY Right 09/20/2013   Procedure: RIGHT ARTHROSCOPY WRIST EXCISION PISIFORM;  Surgeon: Wyn Forster, MD;  Location: [facility];  Service: Orthopedics;  Laterality: Right;    Social History   Socioeconomic History   Marital status: Widowed    Spouse name: Not on file   Number of children: 3   Years of education: Not on file   Highest education level: Not on file  Occupational History    Comment: Research scientist (medical)  Tobacco Use   Smoking status: Never   Smokeless tobacco:  Never  Substance and Sexual Activity   Alcohol use: Yes    Comment: Occasional glass of wine   Drug use: No   Sexual activity: Not on file  Other Topics Concern   Not on file  Social History Narrative   Not on file   Social Determinants of Health   Financial Resource Strain: Not on file  Food Insecurity: Not on file  Transportation Needs: Not on file  Physical Activity: Not on file  Stress: Not on file  Social Connections: Not on file  Intimate Partner Violence: Not on file    Family History  Problem Relation Age of Onset   Hypertension Mother    Heart attack Father        MI at age 76   Breast cancer Daughter    Breast cancer Maternal Aunt    Hyperlipidemia Other    Hypertension Other    Diabetes Neg Hx    Sudden death Neg Hx     ROS: no fevers or chills, productive cough, hemoptysis, dysphasia, odynophagia, melena, hematochezia, dysuria, hematuria, rash, seizure activity, orthopnea, PND, pedal edema, claudication. Remaining systems are negative.  Physical Exam: Well-developed well-nourished in no acute distress.  Skin is warm and dry.  HEENT is normal.  Neck is supple.  Chest is clear to auscultation with normal expansion.  Cardiovascular exam is regular rate and rhythm.  Abdominal exam nontender or distended. No masses palpated. Extremities show no edema. neuro grossly intact  ECG-normal sinus rhythm at a rate of 80, left anterior fascicular block, left ventricular hypertrophy, cannot rule out anterior infarct.  Personally reviewed  A/P  1 coronary artery disease-coronary calcification noted on previous CT scan.  Follow-up stress echocardiogram showed no ischemia.  She is not having chest pain.  Continue aspirin.  2 hyperlipidemia-she would like to avoid lipid-lowering medications.  3 hypertension-blood pressure elevated; add amlodipine 5 mg daily and follow.  Olga Millers, MD

## 2021-11-23 ENCOUNTER — Other Ambulatory Visit (HOSPITAL_BASED_OUTPATIENT_CLINIC_OR_DEPARTMENT_OTHER): Payer: Self-pay | Admitting: Obstetrics and Gynecology

## 2021-11-23 DIAGNOSIS — Z1231 Encounter for screening mammogram for malignant neoplasm of breast: Secondary | ICD-10-CM

## 2021-11-25 ENCOUNTER — Ambulatory Visit (INDEPENDENT_AMBULATORY_CARE_PROVIDER_SITE_OTHER): Payer: Medicare Other | Admitting: Cardiology

## 2021-11-25 ENCOUNTER — Encounter: Payer: Self-pay | Admitting: Cardiology

## 2021-11-25 VITALS — BP 164/100 | HR 80 | Ht 65.0 in | Wt 157.2 lb

## 2021-11-25 DIAGNOSIS — I2584 Coronary atherosclerosis due to calcified coronary lesion: Secondary | ICD-10-CM

## 2021-11-25 DIAGNOSIS — E7800 Pure hypercholesterolemia, unspecified: Secondary | ICD-10-CM

## 2021-11-25 DIAGNOSIS — I10 Essential (primary) hypertension: Secondary | ICD-10-CM

## 2021-11-25 DIAGNOSIS — I2510 Atherosclerotic heart disease of native coronary artery without angina pectoris: Secondary | ICD-10-CM | POA: Diagnosis not present

## 2021-11-25 MED ORDER — AMLODIPINE BESYLATE 5 MG PO TABS: 5.0000 mg | ORAL_TABLET | Freq: Every day | ORAL | 11 refills | Status: DC

## 2021-11-25 NOTE — Patient Instructions (Signed)
Medication Instructions:   START Amlodipine one (1) tablet by mouth (5 mg) daily.    *If you need a refill on your cardiac medications before your next appointment, please call your pharmacy*   Lab Work:  None ordered.  If you have labs (blood work) drawn today and your tests are completely normal, you will receive your results only by: MyChart Message (if you have MyChart) OR A paper copy in the mail If you have any lab test that is abnormal or we need to change your treatment, we will call you to review the results.   Testing/Procedures:   None ordered.   Follow-Up: At [facility], you and your health needs are our priority.  As part of our continuing mission to provide you with exceptional heart care, we have created designated Provider Care Teams.  These Care Teams include your primary Cardiologist (physician) and Advanced Practice Providers (APPs -  Physician Assistants and Nurse Practitioners) who all work together to provide you with the care you need, when you need it.  We recommend signing up for the patient portal called "MyChart".  Sign up information is provided on this After Visit Summary.  MyChart is used to connect with patients for Virtual Visits (Telemedicine).  Patients are able to view lab/test results, encounter notes, upcoming appointments, etc.  Non-urgent messages can be sent to your provider as well.   To learn more about what you can do with MyChart, go to [email or web address].    Your next appointment:   1 year(s)  The format for your next appointment:   In Person  Provider:   Olga Millers, MD    Other Instructions   Your physician wants you to follow-up in: 1 year with Dr. Jens Som.  You will receive a reminder letter in the mail two months in advance. If you don't receive a letter, please call our office to schedule the follow-up appointment.   Important Information About Sugar

## 2021-12-23 ENCOUNTER — Encounter: Payer: Self-pay | Admitting: Cardiology

## 2022-01-11 ENCOUNTER — Ambulatory Visit (HOSPITAL_BASED_OUTPATIENT_CLINIC_OR_DEPARTMENT_OTHER)
Admission: RE | Admit: 2022-01-11 | Discharge: 2022-01-11 | Disposition: A | Discharge status: Home / Self Care | Payer: Medicare Other | Source: Ambulatory Visit | Attending: Obstetrics and Gynecology | Admitting: Obstetrics and Gynecology

## 2022-01-11 ENCOUNTER — Encounter (HOSPITAL_BASED_OUTPATIENT_CLINIC_OR_DEPARTMENT_OTHER): Payer: Self-pay

## 2022-01-11 DIAGNOSIS — Z1231 Encounter for screening mammogram for malignant neoplasm of breast: Secondary | ICD-10-CM | POA: Diagnosis present

## 2022-09-02 ENCOUNTER — Other Ambulatory Visit: Payer: Self-pay | Admitting: Cardiology

## 2022-09-02 DIAGNOSIS — I10 Essential (primary) hypertension: Secondary | ICD-10-CM

## 2022-12-02 NOTE — Progress Notes (Signed)
HPI: FU hypertension. Myoview in 2009 showed an ejection fraction of 64% and normal perfusion. Echocardiogram in March 2015 showed normal LV function, grade 1 diastolic dysfunction and mild mitral regurgitation. Had previous shoulder CT which also visualized atherosclerotic disease in the coronaries and aortic arch.  Stress echocardiogram February 2020 normal.  Since last seen  the patient denies any dyspnea on exertion, orthopnea, PND, pedal edema, palpitations, syncope or chest pain.   Current Outpatient Medications  Medication Sig Dispense Refill   acetaminophen (TYLENOL) 325 MG tablet Take 650 mg by mouth every 6 (six) hours as needed (for pain).      aspirin EC 81 MG tablet Take 81 mg by mouth daily. Evening      Bepotastine Besilate (BEPREVE) 1.5 % SOLN Place 1 drop into both eyes daily as needed (for allergies/hay fever.).     Biotin 5 MG CAPS Take 5 mg by mouth daily.      Cholecalciferol (VITAMIN D3) 2000 UNITS capsule Take 2,000 Units by mouth daily.     Loperamide HCl (IMODIUM A-D PO) Take by mouth.     OLIVE LEAF EXTRACT PO Take 1 tablet by mouth daily.     olmesartan (BENICAR) 40 MG tablet Take 1 tablet by mouth once daily 90 tablet 0   PREMARIN 1.25 MG tablet Take 1.25 mg by mouth daily.      Resveratrol 250 MG CAPS Take 250 mg by mouth daily.      amLODipine (NORVASC) 5 MG tablet Take 1 tablet (5 mg total) by mouth daily. 30 tablet 11   No current facility-administered medications for this visit.     Past Medical History:  Diagnosis Date   Allergy    Anxiety    Arthritis    Asthma    Bronchial    Celiac disease    Full dentures    Headache    migraines   History of hiatal hernia    Hyperlipidemia    Hypertension    IBS (irritable bowel syndrome)    Pneumonia    "years ago"   PONV (postoperative nausea and vomiting)    Wears glasses    readers    Past Surgical History:  Procedure Laterality Date   ABDOMINAL HYSTERECTOMY     APPENDECTOMY     BREAST  BIOPSY Left    benign    BREAST EXCISIONAL BIOPSY Left 1985   CARPAL TUNNEL RELEASE     rt   CHOLECYSTECTOMY     ELBOW SURGERY     EYE SURGERY     both cataracts   KNEE SURGERY     lt   NASAL SEPTUM SURGERY     REVERSE SHOULDER ARTHROPLASTY Right 02/18/2016   Procedure: RIGHT REVERSE TOTAL SHOULDER ARTHROPLASTY;  Surgeon: Loreta Ave, MD;  Location: [facility] OR;  Service: Orthopedics;  Laterality: Right;   REVERSE TOTAL SHOULDER ARTHROPLASTY Right 02/18/2016   SHOULDER ACROMIOPLASTY Right 05/09/2014   Procedure: SHOULDER ACROMIOPLASTY;  Surgeon: Loreta Ave, MD;  Location: [facility];  Service: Orthopedics;  Laterality: Right;   SHOULDER ARTHROSCOPY WITH BICEPSTENOTOMY Right 05/09/2014   Procedure: SHOULDER ARTHROSCOPY WITH BICEPSTENOTOMY;  Surgeon: Loreta Ave, MD;  Location: [facility];  Service: Orthopedics;  Laterality: Right;  Debridement Rotator Cuff Tear   SHOULDER ARTHROSCOPY WITH ROTATOR CUFF REPAIR Right 12/19/2014   Procedure: RIGHT SHOULDER ARTHROSCOPY WITH ROTATOR CUFF REPAIR,CALCIFIC DEBRIDEMENT EXTENSIVE;  Surgeon: Loreta Ave, MD;  Location: [facility];  Service: Orthopedics;  Laterality: Right;   SHOULDER ARTHROSCOPY WITH ROTATOR CUFF REPAIR Right 07/10/2015   Procedure: RIGHT SHOULDER ARTHROSCOPY, DEBRIDEMENT  WITH ROTATOR CUFF REPAIR;  Surgeon: Loreta Ave, MD;  Location: [facility];  Service: Orthopedics;  Laterality: Right;   SHOULDER SURGERY     left   TONSILLECTOMY AND ADENOIDECTOMY     VARICOSE VEIN SURGERY     WRIST ARTHROSCOPY Right 09/20/2013   Procedure: RIGHT ARTHROSCOPY WRIST EXCISION PISIFORM;  Surgeon: Wyn Forster, MD;  Location: [facility];  Service: Orthopedics;  Laterality: Right;    Social History   Socioeconomic History   Marital status: Widowed    Spouse name: Not on file   Number of children: 3   Years of education: Not on file   Highest  education level: Not on file  Occupational History    Comment: Research scientist (medical)  Tobacco Use   Smoking status: Never   Smokeless tobacco: Never  Substance and Sexual Activity   Alcohol use: Yes    Comment: Occasional glass of wine   Drug use: No   Sexual activity: Not on file  Other Topics Concern   Not on file  Social History Narrative   Not on file   Social Determinants of Health   Financial Resource Strain: Not on file  Food Insecurity: Not on file  Transportation Needs: Not on file  Physical Activity: Not on file  Stress: Not on file  Social Connections: Not on file  Intimate Partner Violence: Not on file    Family History  Problem Relation Age of Onset   Hypertension Mother    Heart attack Father        MI at age 69   Breast cancer Daughter    Breast cancer Maternal Aunt    Hyperlipidemia Other    Hypertension Other    Diabetes Neg Hx    Sudden death Neg Hx     ROS: no fevers or chills, productive cough, hemoptysis, dysphasia, odynophagia, melena, hematochezia, dysuria, hematuria, rash, seizure activity, orthopnea, PND, pedal edema, claudication. Remaining systems are negative.  Physical Exam: Well-developed well-nourished in no acute distress.  Skin is warm and dry.  HEENT is normal.  Neck is supple.  Chest is clear to auscultation with normal expansion.  Cardiovascular exam is regular rate and rhythm.  Abdominal exam nontender or distended. No masses palpated. Extremities show no edema. neuro grossly intact  EKG Interpretation Date/Time:  Wednesday December 08 2022 09:07:08 EDT Ventricular Rate:  90 PR Interval:  134 QRS Duration:  98 QT Interval:  370 QTC Calculation: 452 R Axis:   -75  Text Interpretation: Sinus rhythm with frequent Premature ventricular complexes Incomplete right bundle branch block Left anterior fascicular block Minimal voltage criteria for LVH, may be normal variant ( Cornell product ) Anterolateral infarct , age undetermined  When compared with ECG of 08-May-2014 10:18, Premature ventricular complexes are now Present Anterior infarct is now Present Anterolateral infarct is now Present Nonspecific T wave abnormality no longer evident in Inferior leads Confirmed by Olga Millers ([id number]) on 12/08/2022 9:08:45 AM    A/P  1 coronary calcification-noted on previous CT scan.  She has had a previous functional study showing no ischemia.  She also was not having symptoms.  Plan continue aspirin.  2 hyperlipidemia-she has declined all lipid-lowering medications.  3 hypertension-patient's blood pressure is elevated; add amlodipine 5 mg daily and follow blood pressure.  Olga Millers, MD

## 2022-12-05 ENCOUNTER — Other Ambulatory Visit: Payer: Self-pay | Admitting: Cardiology

## 2022-12-05 DIAGNOSIS — I10 Essential (primary) hypertension: Secondary | ICD-10-CM

## 2022-12-08 ENCOUNTER — Encounter: Payer: Self-pay | Admitting: Cardiology

## 2022-12-08 ENCOUNTER — Ambulatory Visit: Payer: Medicare Other | Attending: Cardiology | Admitting: Cardiology

## 2022-12-08 VITALS — BP 187/93 | HR 90 | Ht 65.0 in | Wt 151.1 lb

## 2022-12-08 DIAGNOSIS — I2510 Atherosclerotic heart disease of native coronary artery without angina pectoris: Secondary | ICD-10-CM | POA: Insufficient documentation

## 2022-12-08 DIAGNOSIS — I10 Essential (primary) hypertension: Secondary | ICD-10-CM | POA: Insufficient documentation

## 2022-12-08 DIAGNOSIS — E7800 Pure hypercholesterolemia, unspecified: Secondary | ICD-10-CM | POA: Diagnosis not present

## 2022-12-08 DIAGNOSIS — I2584 Coronary atherosclerosis due to calcified coronary lesion: Secondary | ICD-10-CM | POA: Insufficient documentation

## 2022-12-08 MED ORDER — AMLODIPINE BESYLATE 5 MG PO TABS: 5.0000 mg | ORAL_TABLET | Freq: Every day | ORAL | 3 refills | Status: DC

## 2022-12-08 MED ORDER — OLMESARTAN MEDOXOMIL 40 MG PO TABS: 40.0000 mg | ORAL_TABLET | Freq: Every day | ORAL | 3 refills | Status: DC

## 2022-12-08 NOTE — Patient Instructions (Signed)
Medication Instructions:   RESTART Amlodipine one (1) tablet by mouth ( 5 mg) daily.   *If you need a refill on your cardiac medications before your next appointment, please call your pharmacy*   Lab Work:  None ordered.  If you have labs (blood work) drawn today and your tests are completely normal, you will receive your results only by: MyChart Message (if you have MyChart) OR A paper copy in the mail If you have any lab test that is abnormal or we need to change your treatment, we will call you to review the results.   Testing/Procedures:  None ordered.   Follow-Up: At [facility], you and your health needs are our priority.  As part of our continuing mission to provide you with exceptional heart care, we have created designated Provider Care Teams.  These Care Teams include your primary Cardiologist (physician) and Advanced Practice Providers (APPs -  Physician Assistants and Nurse Practitioners) who all work together to provide you with the care you need, when you need it.  We recommend signing up for the patient portal called "MyChart".  Sign up information is provided on this After Visit Summary.  MyChart is used to connect with patients for Virtual Visits (Telemedicine).  Patients are able to view lab/test results, encounter notes, upcoming appointments, etc.  Non-urgent messages can be sent to your provider as well.   To learn more about what you can do with MyChart, go to [email or web address].    Your next appointment:   1 year(s)  Provider:   Olga Millers, MD    Other Instructions  Your physician wants you to follow-up in: 1 year. You will receive a reminder letter in the mail two months in advance. If you don't receive a letter, please call our office to schedule the follow-up appointment.  Please keep a check on your BP for [place]. If any concerns please send mychart message.

## 2022-12-17 ENCOUNTER — Other Ambulatory Visit (HOSPITAL_BASED_OUTPATIENT_CLINIC_OR_DEPARTMENT_OTHER): Payer: Self-pay | Admitting: Obstetrics and Gynecology

## 2022-12-17 DIAGNOSIS — Z1231 Encounter for screening mammogram for malignant neoplasm of breast: Secondary | ICD-10-CM

## 2022-12-22 ENCOUNTER — Encounter: Payer: Self-pay | Admitting: Cardiology

## 2023-01-17 ENCOUNTER — Encounter (HOSPITAL_BASED_OUTPATIENT_CLINIC_OR_DEPARTMENT_OTHER): Payer: Self-pay

## 2023-01-17 ENCOUNTER — Ambulatory Visit (HOSPITAL_BASED_OUTPATIENT_CLINIC_OR_DEPARTMENT_OTHER)
Admission: RE | Admit: 2023-01-17 | Discharge: 2023-01-17 | Disposition: A | Discharge status: Home / Self Care | Payer: Medicare Other | Source: Ambulatory Visit | Attending: Obstetrics and Gynecology | Admitting: Obstetrics and Gynecology

## 2023-01-17 DIAGNOSIS — Z1231 Encounter for screening mammogram for malignant neoplasm of breast: Secondary | ICD-10-CM | POA: Insufficient documentation

## 2023-08-31 ENCOUNTER — Other Ambulatory Visit: Payer: Self-pay | Admitting: Cardiology

## 2023-08-31 DIAGNOSIS — I10 Essential (primary) hypertension: Secondary | ICD-10-CM

## 2023-11-29 ENCOUNTER — Telehealth: Payer: Self-pay | Admitting: Cardiology

## 2023-11-29 ENCOUNTER — Other Ambulatory Visit: Payer: Self-pay | Admitting: Cardiology

## 2023-11-29 DIAGNOSIS — I10 Essential (primary) hypertension: Secondary | ICD-10-CM

## 2023-11-29 MED ORDER — OLMESARTAN MEDOXOMIL 40 MG PO TABS: 40.0000 mg | ORAL_TABLET | Freq: Every day | ORAL | 0 refills | Status: DC

## 2023-11-29 NOTE — Telephone Encounter (Signed)
*  STAT* If patient is at the pharmacy, call can be transferred to refill team.   1. Which medications need to be refilled? (please list name of each medication and dose if known)   olmesartan  (BENICAR ) 40 MG tablet     2. Would you like to learn more about the convenience, safety, & potential cost savings by using the [facility]? No   3. Are you open to using the [facility] (Type [facility]. ). No   4. Which pharmacy/location (including street and city if local pharmacy) is medication to be sent to?[facility] - [place], [place] - [address]    5. Do they need a 30 day or 90 day supply? 90 day   Pt has appt with Dr. Pietro on 10/1

## 2023-12-05 ENCOUNTER — Other Ambulatory Visit (HOSPITAL_BASED_OUTPATIENT_CLINIC_OR_DEPARTMENT_OTHER): Payer: Self-pay | Admitting: Obstetrics and Gynecology

## 2023-12-05 DIAGNOSIS — Z1231 Encounter for screening mammogram for malignant neoplasm of breast: Secondary | ICD-10-CM

## 2023-12-31 NOTE — Progress Notes (Signed)
 HPI:  FU hypertension. Myoview in 2009 showed an ejection fraction of 64% and normal perfusion. Echocardiogram in March 2015 showed normal LV function, grade 1 diastolic dysfunction and mild mitral regurgitation. Had previous shoulder CT which also visualized atherosclerotic disease in the coronaries and aortic arch.  Stress echocardiogram February 2020 normal.  Since last seen there is no dyspnea, chest pain, palpitations or syncope.  Current Outpatient Medications  Medication Sig Dispense Refill   acetaminophen  (TYLENOL ) 325 MG tablet Take 650 mg by mouth every 6 (six) hours as needed (for pain).      amLODipine  (NORVASC ) 5 MG tablet Take 1 tablet (5 mg total) by mouth daily. 90 tablet 3   Bepotastine Besilate (BEPREVE) 1.5 % SOLN Place 1 drop into both eyes daily as needed (for allergies/hay fever.).     Biotin 5 MG CAPS Take 5 mg by mouth daily.      Cholecalciferol (VITAMIN D3) 2000 UNITS capsule Take 2,000 Units by mouth daily.     Loperamide HCl (IMODIUM A-D PO) Take by mouth.     OLIVE LEAF EXTRACT PO Take 1 tablet by mouth daily.     olmesartan  (BENICAR ) 40 MG tablet Take 1 tablet (40 mg total) by mouth daily. 90 tablet 0   PREMARIN  1.25 MG tablet Take 1.25 mg by mouth daily.      Resveratrol 250 MG CAPS Take 250 mg by mouth daily.      No current facility-administered medications for this visit.     Past Medical History:  Diagnosis Date   Allergy    Anxiety    Arthritis    Asthma    Bronchial    Celiac disease    Full dentures    Headache    migraines   History of hiatal hernia    Hyperlipidemia    Hypertension    IBS (irritable bowel syndrome)    Pneumonia    years ago   PONV (postoperative nausea and vomiting)    Wears glasses    readers    Past Surgical History:  Procedure Laterality Date   ABDOMINAL HYSTERECTOMY     APPENDECTOMY     BREAST BIOPSY Left    benign    BREAST EXCISIONAL BIOPSY Left 1985   CARPAL TUNNEL RELEASE     rt    CHOLECYSTECTOMY     ELBOW SURGERY     EYE SURGERY     both cataracts   KNEE SURGERY     lt   NASAL SEPTUM SURGERY     REVERSE SHOULDER ARTHROPLASTY Right 02/18/2016   Procedure: RIGHT REVERSE TOTAL SHOULDER ARTHROPLASTY;  Surgeon: Toribio JULIANNA Chancy, MD;  Location: [facility] OR;  Service: Orthopedics;  Laterality: Right;   REVERSE TOTAL SHOULDER ARTHROPLASTY Right 02/18/2016   SHOULDER ACROMIOPLASTY Right 05/09/2014   Procedure: SHOULDER ACROMIOPLASTY;  Surgeon: Toribio JULIANNA Chancy, MD;  Location: [facility];  Service: Orthopedics;  Laterality: Right;   SHOULDER ARTHROSCOPY WITH BICEPSTENOTOMY Right 05/09/2014   Procedure: SHOULDER ARTHROSCOPY WITH BICEPSTENOTOMY;  Surgeon: Toribio JULIANNA Chancy, MD;  Location: [facility];  Service: Orthopedics;  Laterality: Right;  Debridement Rotator Cuff Tear   SHOULDER ARTHROSCOPY WITH ROTATOR CUFF REPAIR Right 12/19/2014   Procedure: RIGHT SHOULDER ARTHROSCOPY WITH ROTATOR CUFF REPAIR,CALCIFIC DEBRIDEMENT EXTENSIVE;  Surgeon: Toribio JULIANNA Chancy, MD;  Location:  SUR[facility]ERY C[facility];  Service: Orthopedics;  Laterality: Right;   SHOULDER ARTHROSCOPY WITH ROTATOR CUFF REPAIR Right 07/10/2015   Procedure: RIGHT SHOULDER ARTHROSCOPY, DEBRIDEMENT  WITH ROTATOR CUFF REPAIR;  Surgeon: Toribio JULIANNA Chancy, MD;  Location: [facility];  Service: Orthopedics;  Laterality: Right;   SHOULDER SURGERY     left   TONSILLECTOMY AND ADENOIDECTOMY     VARICOSE VEIN SURGERY     WRIST ARTHROSCOPY Right 09/20/2013   Procedure: RIGHT ARTHROSCOPY WRIST EXCISION PISIFORM;  Surgeon: Lamar Leonor Mickey LULLA, MD;  Location: [facility];  Service: Orthopedics;  Laterality: Right;    Social History   Socioeconomic History   Marital status: Widowed    Spouse name: Not on file   Number of children: 3   Years of education: Not on file   Highest education level: Not on file  Occupational History    Comment: Research scientist (medical)  Tobacco Use    Smoking status: Never   Smokeless tobacco: Never  Substance and Sexual Activity   Alcohol use: Yes    Comment: Occasional glass of wine   Drug use: No   Sexual activity: Not on file  Other Topics Concern   Not on file  Social History Narrative   Not on file   Social Drivers of Health   Financial Resource Strain: Not on file  Food Insecurity: Not on file  Transportation Needs: Not on file  Physical Activity: Not on file  Stress: Not on file  Social Connections: Not on file  Intimate Partner Violence: Not on file    Family History  Problem Relation Age of Onset   Hypertension Mother    Heart attack Father        MI at age 70   Breast cancer Daughter    Breast cancer Maternal Aunt    Hyperlipidemia Other    Hypertension Other    Diabetes Neg Hx    Sudden death Neg Hx     ROS: no fevers or chills, productive cough, hemoptysis, dysphasia, odynophagia, melena, hematochezia, dysuria, hematuria, rash, seizure activity, orthopnea, PND, pedal edema, claudication. Remaining systems are negative.  Physical Exam: Well-developed well-nourished in no acute distress.  Skin is warm and dry.  HEENT is normal.  Neck is supple.  Chest is clear to auscultation with normal expansion.  Cardiovascular exam is regular rate and rhythm.  Abdominal exam nontender or distended. No masses palpated. Extremities show no edema. neuro grossly intact  EKG Interpretation Date/Time:  Wednesday January 11 2024 10:21:55 EDT Ventricular Rate:  88 PR Interval:  146 QRS Duration:  112 QT Interval:  374 QTC Calculation: 452 R Axis:   134  Text Interpretation: Sinus rhythm with occasional Premature ventricular complexes Incomplete right bundle branch block Left posterior fascicular block T wave abnormality, consider inferior ischemia Confirmed by Pietro Rogue ([id number]) on 01/11/2024 10:30:56 AM    A/P  1 coronary calcification-patient is not having chest pain.  Plan to continue medical therapy.   Will continue aspirin .  2 hypertension-patient's blood pressure is elevated; she is hesitant to add additional medications but states she will try amlodipine  2.5 mg daily.  Follow blood pressure and adjust medications as needed.  3 hyperlipidemia-she has previously declined all lipid-lowering medications.  Continue diet.  Rogue Pietro, MD

## 2024-01-11 ENCOUNTER — Encounter: Payer: Self-pay | Admitting: Cardiology

## 2024-01-11 ENCOUNTER — Ambulatory Visit: Attending: Cardiology | Admitting: Cardiology

## 2024-01-11 VITALS — BP 209/88 | HR 88 | Ht 65.0 in | Wt 141.4 lb

## 2024-01-11 DIAGNOSIS — I10 Essential (primary) hypertension: Secondary | ICD-10-CM | POA: Insufficient documentation

## 2024-01-11 DIAGNOSIS — I2510 Atherosclerotic heart disease of native coronary artery without angina pectoris: Secondary | ICD-10-CM | POA: Diagnosis not present

## 2024-01-11 DIAGNOSIS — E7800 Pure hypercholesterolemia, unspecified: Secondary | ICD-10-CM | POA: Diagnosis not present

## 2024-01-11 MED ORDER — AMLODIPINE BESYLATE 2.5 MG PO TABS: 2.5000 mg | ORAL_TABLET | Freq: Every day | ORAL | 3 refills | Status: AC

## 2024-01-11 NOTE — Patient Instructions (Signed)
 Medication Instructions:   START AMLODIPINE  2.5 MG ONCE DAILY  *If you need a refill on your cardiac medications before your next appointment, please call your pharmacy*  Follow-Up: At [facility], you and your health needs are our priority.  As part of our continuing mission to provide you with exceptional heart care, our providers are all part of one team.  This team includes your primary Cardiologist (physician) and Advanced Practice Providers or APPs (Physician Assistants and Nurse Practitioners) who all work together to provide you with the care you need, when you need it.  Your next appointment:   12 month(s)  Provider:   Redell Shallow, MD

## 2024-01-23 ENCOUNTER — Ambulatory Visit (HOSPITAL_BASED_OUTPATIENT_CLINIC_OR_DEPARTMENT_OTHER)
Admission: RE | Admit: 2024-01-23 | Discharge: 2024-01-23 | Disposition: A | Discharge status: Home / Self Care | Source: Ambulatory Visit | Attending: Obstetrics and Gynecology | Admitting: Obstetrics and Gynecology

## 2024-01-23 ENCOUNTER — Encounter (HOSPITAL_BASED_OUTPATIENT_CLINIC_OR_DEPARTMENT_OTHER): Payer: Self-pay

## 2024-01-23 DIAGNOSIS — Z1231 Encounter for screening mammogram for malignant neoplasm of breast: Secondary | ICD-10-CM | POA: Diagnosis present

## 2024-02-22 ENCOUNTER — Other Ambulatory Visit: Payer: Self-pay | Admitting: Cardiology

## 2024-02-22 DIAGNOSIS — I10 Essential (primary) hypertension: Secondary | ICD-10-CM
